# Patient Record
Sex: Female | Born: 1935
Health system: Southern US, Community
[De-identification: ages and names within clinical notes are randomized; demographics above are authoritative.]

## PROBLEM LIST (undated history)

## (undated) DIAGNOSIS — M1990 Unspecified osteoarthritis, unspecified site: Secondary | ICD-10-CM

## (undated) DIAGNOSIS — R590 Localized enlarged lymph nodes: Secondary | ICD-10-CM

## (undated) DIAGNOSIS — E785 Hyperlipidemia, unspecified: Secondary | ICD-10-CM

## (undated) DIAGNOSIS — I10 Essential (primary) hypertension: Secondary | ICD-10-CM

## (undated) DIAGNOSIS — D649 Anemia, unspecified: Secondary | ICD-10-CM

## (undated) DIAGNOSIS — R42 Dizziness and giddiness: Secondary | ICD-10-CM

## (undated) HISTORY — DX: Anemia, unspecified: D64.9

## (undated) HISTORY — DX: Hyperlipidemia, unspecified: E78.5

## (undated) HISTORY — DX: Essential (primary) hypertension: I10

## (undated) HISTORY — DX: Dizziness and giddiness: R42

---

## 2000-02-02 ENCOUNTER — Encounter: Payer: Self-pay | Admitting: Family Medicine

## 2000-02-02 ENCOUNTER — Ambulatory Visit (HOSPITAL_COMMUNITY): Admission: RE | Admit: 2000-02-02 | Discharge: 2000-02-02 | Payer: Self-pay | Admitting: Family Medicine

## 2000-09-15 ENCOUNTER — Emergency Department (HOSPITAL_COMMUNITY): Admission: EM | Admit: 2000-09-15 | Discharge: 2000-09-15 | Payer: Self-pay

## 2000-09-24 ENCOUNTER — Emergency Department (HOSPITAL_COMMUNITY): Admission: EM | Admit: 2000-09-24 | Discharge: 2000-09-24 | Payer: Self-pay

## 2001-11-18 ENCOUNTER — Other Ambulatory Visit: Admission: RE | Admit: 2001-11-18 | Discharge: 2001-11-18 | Payer: Self-pay | Admitting: Family Medicine

## 2001-11-28 ENCOUNTER — Ambulatory Visit (HOSPITAL_COMMUNITY): Admission: RE | Admit: 2001-11-28 | Discharge: 2001-11-28 | Payer: Self-pay | Admitting: Family Medicine

## 2001-11-28 ENCOUNTER — Encounter: Payer: Self-pay | Admitting: Family Medicine

## 2002-01-02 HISTORY — PX: ROTATOR CUFF REPAIR: SHX139

## 2002-10-29 ENCOUNTER — Encounter: Admission: RE | Admit: 2002-10-29 | Discharge: 2003-01-27 | Payer: Self-pay | Admitting: Family Medicine

## 2004-01-31 ENCOUNTER — Emergency Department (HOSPITAL_COMMUNITY): Admission: EM | Admit: 2004-01-31 | Discharge: 2004-01-31 | Payer: Self-pay | Admitting: Family Medicine

## 2004-08-15 ENCOUNTER — Ambulatory Visit: Payer: Self-pay | Admitting: Family Medicine

## 2004-09-04 HISTORY — PX: OTHER SURGICAL HISTORY: SHX169

## 2004-09-15 ENCOUNTER — Ambulatory Visit: Payer: Self-pay | Admitting: Family Medicine

## 2004-09-27 ENCOUNTER — Ambulatory Visit (HOSPITAL_COMMUNITY): Admission: RE | Admit: 2004-09-27 | Discharge: 2004-09-27 | Payer: Self-pay | Admitting: Family Medicine

## 2005-01-24 ENCOUNTER — Ambulatory Visit: Payer: Self-pay | Admitting: Family Medicine

## 2005-04-25 ENCOUNTER — Other Ambulatory Visit: Admission: RE | Admit: 2005-04-25 | Discharge: 2005-04-25 | Payer: Self-pay | Admitting: Obstetrics and Gynecology

## 2005-09-03 ENCOUNTER — Emergency Department (HOSPITAL_COMMUNITY): Admission: EM | Admit: 2005-09-03 | Discharge: 2005-09-03 | Payer: Self-pay | Admitting: Family Medicine

## 2006-02-02 ENCOUNTER — Encounter: Payer: Self-pay | Admitting: Family Medicine

## 2006-02-02 LAB — CONVERTED CEMR LAB

## 2006-08-03 ENCOUNTER — Ambulatory Visit: Payer: Self-pay | Admitting: Family Medicine

## 2006-08-10 ENCOUNTER — Ambulatory Visit (HOSPITAL_COMMUNITY): Admission: RE | Admit: 2006-08-10 | Discharge: 2006-08-10 | Payer: Self-pay | Admitting: Family Medicine

## 2006-10-03 ENCOUNTER — Ambulatory Visit: Payer: Self-pay | Admitting: Family Medicine

## 2006-12-03 ENCOUNTER — Ambulatory Visit: Payer: Self-pay | Admitting: Family Medicine

## 2006-12-03 LAB — CONVERTED CEMR LAB
ALT: 18 units/L (ref 0–40)
AST: 22 units/L (ref 0–37)
Albumin: 3.5 g/dL (ref 3.5–5.2)
Alkaline Phosphatase: 51 units/L (ref 39–117)
Bilirubin, Direct: 0.1 mg/dL (ref 0.0–0.3)
Cholesterol: 134 mg/dL (ref 0–200)
HDL: 55.5 mg/dL (ref >39.0)
Hgb A1c MFr Bld: 6.7 % — ABNORMAL HIGH (ref 4.6–6.0)
LDL Cholesterol: 68 mg/dL (ref 0–99)
Total Bilirubin: 0.8 mg/dL (ref 0.3–1.2)
Total CHOL/HDL Ratio: 2.4
Total Protein: 6.4 g/dL (ref 6.0–8.3)
Triglycerides: 52 mg/dL (ref 0–149)
VLDL: 10 mg/dL (ref 0–40)

## 2007-07-17 ENCOUNTER — Encounter: Payer: Self-pay | Admitting: Family Medicine

## 2007-07-17 DIAGNOSIS — E785 Hyperlipidemia, unspecified: Secondary | ICD-10-CM

## 2007-07-17 DIAGNOSIS — D649 Anemia, unspecified: Secondary | ICD-10-CM

## 2007-07-17 DIAGNOSIS — E119 Type 2 diabetes mellitus without complications: Secondary | ICD-10-CM | POA: Insufficient documentation

## 2007-07-17 DIAGNOSIS — I4949 Other premature depolarization: Secondary | ICD-10-CM

## 2007-07-17 DIAGNOSIS — N390 Urinary tract infection, site not specified: Secondary | ICD-10-CM

## 2007-07-17 DIAGNOSIS — E1169 Type 2 diabetes mellitus with other specified complication: Secondary | ICD-10-CM | POA: Insufficient documentation

## 2007-07-17 DIAGNOSIS — I10 Essential (primary) hypertension: Secondary | ICD-10-CM | POA: Insufficient documentation

## 2007-07-18 ENCOUNTER — Ambulatory Visit: Payer: Self-pay | Admitting: Family Medicine

## 2008-02-18 ENCOUNTER — Encounter: Payer: Self-pay | Admitting: Family Medicine

## 2008-04-22 ENCOUNTER — Ambulatory Visit: Payer: Self-pay | Admitting: Family Medicine

## 2008-05-01 ENCOUNTER — Ambulatory Visit: Payer: Self-pay | Admitting: Family Medicine

## 2008-05-01 DIAGNOSIS — M810 Age-related osteoporosis without current pathological fracture: Secondary | ICD-10-CM

## 2008-05-04 LAB — CONVERTED CEMR LAB
ALT: 17 units/L (ref 0–35)
AST: 20 units/L (ref 0–37)
Alkaline Phosphatase: 52 units/L (ref 39–117)
BUN: 13 mg/dL (ref 6–23)
Basophils Absolute: 0.1 10*3/uL (ref 0.0–0.1)
Bilirubin, Direct: 0.1 mg/dL (ref 0.0–0.3)
CO2: 31 meq/L (ref 19–32)
Calcium: 9.2 mg/dL (ref 8.4–10.5)
Cholesterol: 125 mg/dL (ref 0–200)
Creatinine, Ser: 0.7 mg/dL (ref 0.4–1.2)
Glucose, Bld: 124 mg/dL — ABNORMAL HIGH (ref 70–99)
Hgb A1c MFr Bld: 6.6 % — ABNORMAL HIGH (ref 4.6–6.0)
LDL Cholesterol: 73 mg/dL (ref 0–99)
Lymphocytes Relative: 36.7 % (ref 12.0–46.0)
MCHC: 34 g/dL (ref 30.0–36.0)
Monocytes Relative: 10.6 % (ref 3.0–12.0)
Neutrophils Relative %: 47.3 % (ref 43.0–77.0)
Platelets: 241 10*3/uL (ref 150–400)
RDW: 13.1 % (ref 11.5–14.6)
Sodium: 142 meq/L (ref 135–145)
Total Bilirubin: 0.8 mg/dL (ref 0.3–1.2)
Total CHOL/HDL Ratio: 2.9
Triglycerides: 40 mg/dL (ref 0–149)
VLDL: 8 mg/dL (ref 0–40)

## 2008-05-06 ENCOUNTER — Ambulatory Visit: Payer: Self-pay | Admitting: Family Medicine

## 2008-05-12 LAB — CONVERTED CEMR LAB
Folate: 12.8 ng/mL
Iron: 60 ug/dL (ref 42–145)
Saturation Ratios: 19.4 % — ABNORMAL LOW (ref 20.0–50.0)
Vitamin B-12: 400 pg/mL (ref 211–911)

## 2008-05-29 ENCOUNTER — Ambulatory Visit: Payer: Self-pay | Admitting: Family Medicine

## 2008-05-29 LAB — CONVERTED CEMR LAB: OCCULT 3: NEGATIVE

## 2008-06-10 ENCOUNTER — Ambulatory Visit: Payer: Self-pay | Admitting: Family Medicine

## 2008-06-10 DIAGNOSIS — D568 Other thalassemias: Secondary | ICD-10-CM | POA: Insufficient documentation

## 2008-06-12 LAB — CONVERTED CEMR LAB
Basophils Absolute: 0 10*3/uL (ref 0.0–0.1)
Eosinophils Relative: 4 % (ref 0–5)
HCT: 34.7 % — ABNORMAL LOW (ref 36.0–46.0)
Hemoglobin: 11 g/dL — ABNORMAL LOW (ref 12.0–15.0)
Hgb S Quant: 0 % (ref 0.0–0.0)
Lymphocytes Relative: 43 % (ref 12–46)
Lymphs Abs: 2.4 10*3/uL (ref 0.7–4.0)
MCV: 94.8 fL (ref 78.0–100.0)
Monocytes Absolute: 0.5 10*3/uL (ref 0.1–1.0)
RDW: 13.4 % (ref 11.5–15.5)

## 2008-06-18 ENCOUNTER — Ambulatory Visit: Payer: Self-pay | Admitting: Internal Medicine

## 2008-07-02 ENCOUNTER — Ambulatory Visit: Payer: Self-pay | Admitting: Internal Medicine

## 2008-07-02 LAB — HM COLONOSCOPY

## 2009-08-04 LAB — HM DIABETES EYE EXAM: HM Diabetic Eye Exam: NORMAL

## 2009-08-24 ENCOUNTER — Ambulatory Visit (HOSPITAL_COMMUNITY): Admission: RE | Admit: 2009-08-24 | Discharge: 2009-08-24 | Payer: Self-pay | Admitting: Family Medicine

## 2009-08-26 ENCOUNTER — Encounter (INDEPENDENT_AMBULATORY_CARE_PROVIDER_SITE_OTHER): Payer: Self-pay | Admitting: *Deleted

## 2009-10-04 ENCOUNTER — Ambulatory Visit: Payer: Self-pay | Admitting: Family Medicine

## 2009-10-04 LAB — CONVERTED CEMR LAB
Ketones, urine, test strip: NEGATIVE
Urobilinogen, UA: 0.2

## 2009-10-05 ENCOUNTER — Encounter: Payer: Self-pay | Admitting: Family Medicine

## 2009-10-06 ENCOUNTER — Encounter: Payer: Self-pay | Admitting: Family Medicine

## 2009-10-06 LAB — CONVERTED CEMR LAB
ALT: 19 units/L (ref 0–35)
BUN: 8 mg/dL (ref 6–23)
CO2: 31 meq/L (ref 19–32)
Calcium: 9.3 mg/dL (ref 8.4–10.5)
Cholesterol: 151 mg/dL (ref 0–200)
Creatinine, Ser: 0.8 mg/dL (ref 0.4–1.2)
Eosinophils Relative: 3.5 % (ref 0.0–5.0)
HCT: 34.1 % — ABNORMAL LOW (ref 36.0–46.0)
HDL: 62.5 mg/dL (ref >39.00)
Hgb A1c MFr Bld: 6.9 % — ABNORMAL HIGH (ref 4.6–6.5)
Lymphs Abs: 2 10*3/uL (ref 0.7–4.0)
MCV: 96.2 fL (ref 78.0–100.0)
Microalb, Ur: 4.8 mg/dL — ABNORMAL HIGH (ref 0.0–1.9)
Monocytes Absolute: 0.4 10*3/uL (ref 0.1–1.0)
Platelets: 240 10*3/uL (ref 150.0–400.0)
RDW: 12.6 % (ref 11.5–14.6)
TSH: 0.81 microintl units/mL (ref 0.35–5.50)
Total Bilirubin: 0.5 mg/dL (ref 0.3–1.2)
Triglycerides: 54 mg/dL (ref 0.0–149.0)
WBC: 5 10*3/uL (ref 4.5–10.5)

## 2009-10-11 ENCOUNTER — Encounter: Payer: Self-pay | Admitting: Family Medicine

## 2009-10-11 ENCOUNTER — Ambulatory Visit: Payer: Self-pay | Admitting: Internal Medicine

## 2009-10-22 ENCOUNTER — Ambulatory Visit: Payer: Self-pay | Admitting: Family Medicine

## 2009-10-22 DIAGNOSIS — E876 Hypokalemia: Secondary | ICD-10-CM | POA: Insufficient documentation

## 2009-10-25 LAB — CONVERTED CEMR LAB: Potassium: 3.6 meq/L (ref 3.5–5.1)

## 2009-11-12 LAB — FECAL OCCULT BLOOD, GUAIAC: Fecal Occult Blood: NEGATIVE

## 2010-03-02 ENCOUNTER — Ambulatory Visit: Payer: Self-pay | Admitting: Family Medicine

## 2010-03-03 LAB — CONVERTED CEMR LAB
Albumin: 3.8 g/dL (ref 3.5–5.2)
BUN: 12 mg/dL (ref 6–23)
CO2: 32 meq/L (ref 19–32)
Calcium: 9.2 mg/dL (ref 8.4–10.5)
Creatinine, Ser: 0.7 mg/dL (ref 0.4–1.2)
GFR calc non Af Amer: 107.46 mL/min (ref >60)

## 2010-04-19 ENCOUNTER — Ambulatory Visit: Payer: Self-pay | Admitting: Family Medicine

## 2010-05-24 ENCOUNTER — Ambulatory Visit: Payer: Self-pay | Admitting: Family Medicine

## 2010-10-04 NOTE — Miscellaneous (Signed)
Summary: med list update- potassium  Clinical Lists Changes  Medications: Added new medication of POTASSIUM CHLORIDE CRYS CR 20 MEQ CR-TABS (POTASSIUM CHLORIDE CRYS CR) take one by mouth daily     Prior Medications: ENALAPRIL-HYDROCHLOROTHIAZIDE 10-25 MG TABS (ENALAPRIL-HYDROCHLOROTHIAZIDE) one by mouth daily ACTONEL 35 MG  TABS (RISEDRONATE SODIUM) one by mouth weekly CRESTOR 20 MG  TABS (ROSUVASTATIN CALCIUM) one by mouth once daily ESTRACE 0.1 MG/GM  CREA (ESTRADIOL) use small amount intravaginally twice weekly as directed FLONASE 50 MCG/ACT  SUSP (FLUTICASONE PROPIONATE) 2 sprays in each nostril once daily CORTISPORIN 3.5-10000-1  SOLN (NEOMYCIN-POLYMYXIN-HC) 2 drops in affected ear three times a day MIRALAX   POWD (POLYETHYLENE GLYCOL 3350) per prep instructions DULCOLAX 5 MG  TBEC (BISACODYL) per prep instructions METOCLOPRAMIDE HCL 10 MG  TABS (METOCLOPRAMIDE HCL) per prep instructions LUMIGAN 0.03 % SOLN (BIMATOPROST) use one drop each eye at bedtime ALPHAGAN P 0.1 % SOLN (BRIMONIDINE TARTRATE) use one drop in left eye two times a day Current Allergies: SULFA LIPITOR

## 2010-10-04 NOTE — Assessment & Plan Note (Signed)
Summary: 2 weel follow up   Vital Signs:  Patient profile:   75 year old female Height:      61 inches Weight:      160 pounds BMI:     30.34 Temp:     97.6 degrees F oral Pulse rate:   92 / minute Pulse rhythm:   regular BP sitting:   128 / 70  (left arm) Cuff size:   regular  Vitals Entered By: Lewanda Rife LPN (October 22, 2009 9:11 AM)  History of Present Illness: here to f/u for hypokalemia and also DM and OP   is feeling good now   K was 3.1 - so started on suppl daily- needs that re checked today no problems with potassium  this helped her cramps and mucsle pain   AIC was 6.9 microalb up  is trying to follow diabetes diet the best -- also does a lot of fasting for her church  has done dM classes  is not eating any sweets  perhaps too much bread - does eat whole grain   uses her daughters machine to check sugar 130s-150s    wt stable  OP on dexa -- on actonel   has been exercising a lot   Allergies: 1)  Sulfa 2)  Lipitor  Past History:  Past Medical History: Last updated: 05/06/2008 Diabetes mellitus, type II Dizziness or vertigo Hypertension hyperlipidemia anemia   Past Surgical History: Last updated: 07/05/2008 Hysterectomy- uterine prolapse Rotator cuff repair (01/2002) Dexa- osteopenia (12/2001) Bladder papilloma (2006) colonoscopy diverticulosis (10/09)  Family History: Last updated: 08-07-07 Father: deceased- [place] Mother:deceased- [place]  Siblings: 6 brothers, 1 sister  Social History: Last updated: 05/06/2008 Marital Status: Married Children: retired Occupation: 4 daughters non smoker   Risk Factors: Smoking Status: never (August 07, 2007)  Review of Systems General:  Denies fatigue, fever, loss of appetite, and malaise. Eyes:  Denies blurring and discharge. CV:  Denies chest pain or discomfort, lightheadness, palpitations, and shortness of breath with exertion. Resp:  Denies cough and wheezing. GI:  Denies abdominal pain,  change in bowel habits, indigestion, and nausea. GU:  Denies urinary frequency. MS:  Denies muscle aches and muscle weakness. Derm:  Denies itching, lesion(s), and rash. Neuro:  Denies numbness and tingling. Endo:  Denies excessive thirst and excessive urination. Heme:  Denies abnormal bruising and bleeding.  Physical Exam  General:  Well-developed,well-nourished,in no acute distress; alert,appropriate and cooperative throughout examination Head:  normocephalic, atraumatic, and no abnormalities observed.   Eyes:  vision grossly intact, pupils equal, pupils round, and pupils reactive to light.  no conjunctival pallor, injection or icterus  Neck:  supple with full rom and no masses or thyromegally, no JVD or carotid bruit  Lungs:  Normal respiratory effort, chest expands symmetrically. Lungs are clear to auscultation, no crackles or wheezes. Heart:  Normal rate and regular rhythm. S1 and S2 normal without gallop, murmur, click, rub or other extra sounds. Msk:  No deformity or scoliosis noted of thoracic or lumbar spine.  no acute joint change  no CVA tenderness  Pulses:  R and L carotid,radial,femoral,dorsalis pedis and posterior tibial pulses are full and equal bilaterally Extremities:  No clubbing, cyanosis, edema, or deformity noted with normal full range of motion of all joints.   Neurologic:  sensation intact to light touch, gait normal, and DTRs symmetrical and normal.   Skin:  Intact without suspicious lesions or rashes some lentigos diffusely Cervical Nodes:  No lymphadenopathy noted Psych:  normal affect, talkative  and pleasant   Diabetes Management Exam:    Foot Exam (with socks and/or shoes not present):       Sensory-Pinprick/Light touch:          Left medial foot (L-4): normal          Left dorsal foot (L-5): normal          Left lateral foot (S-1): normal          Right medial foot (L-4): normal          Right dorsal foot (L-5): normal          Right lateral foot (S-1):  normal       Sensory-Monofilament:          Left foot: normal          Right foot: normal       Inspection:          Left foot: normal          Right foot: normal       Nails:          Left foot: normal          Right foot: normal   Impression & Recommendations:  Problem # 1:  HYPOKALEMIA (ICD-276.8) Assessment New pt feels better on supplementation check level today  Orders: Venipuncture (29518) TLB-Potassium (K+) (84132-K)  Problem # 2:  UNSPECIFIED OSTEOPOROSIS (ICD-733.00) Assessment: Unchanged rev bone density test with OP rev ca and vit D continue actonel Her updated medication list for this problem includes:    Actonel 35 Mg Tabs (Risedronate sodium) ..... One by mouth weekly  Problem # 3:  DIABETES MELLITUS, TYPE II (ICD-250.00) Assessment: Deteriorated  not at goal / with elevated microalb - despite opt diet enc to continue exercise  add metformin two times a day 500 and update  lab and f/u 3 mo  disc healthy diet (low simple sugar/ choose complex carbs/ low sat fat) diet and exercise in detail  Her updated medication list for this problem includes:    Enalapril-hydrochlorothiazide 10-25 Mg Tabs (Enalapril-hydrochlorothiazide) ..... One by mouth daily    Glucophage 500 Mg Tabs (Metformin hcl) .Marland Kitchen... 1 by mouth two times a day  Labs Reviewed: Creat: 0.8 (10/04/2009)     Last Eye Exam: normal (08/04/2009) Reviewed HgBA1c results: 6.9 (10/04/2009)  6.6 (05/01/2008)  Problem # 4:  HYPERCHOLESTEROLEMIA (ICD-272.0) Assessment: Unchanged  has been well controlled on crestor and low sat fat diet rev last labs   Her updated medication list for this problem includes:    Crestor 20 Mg Tabs (Rosuvastatin calcium) ..... One by mouth once daily  Labs Reviewed: SGOT: 26 (10/04/2009)   SGPT: 19 (10/04/2009)   HDL:62.50 (10/04/2009), 43.8 (05/01/2008)  LDL:78 (10/04/2009), 73 (05/01/2008)  Chol:151 (10/04/2009), 125 (05/01/2008)  Trig:54.0 (10/04/2009), 40  (05/01/2008)  Complete Medication List: 1)  Enalapril-hydrochlorothiazide 10-25 Mg Tabs (Enalapril-hydrochlorothiazide) .... One by mouth daily 2)  Actonel 35 Mg Tabs (Risedronate sodium) .... One by mouth weekly 3)  Crestor 20 Mg Tabs (Rosuvastatin calcium) .... One by mouth once daily 4)  Estrace 0.1 Mg/gm Crea (Estradiol) .... Use small amount intravaginally twice weekly as directed 5)  Flonase 50 Mcg/act Susp (Fluticasone propionate) .... 2 sprays in each nostril once daily as needed 6)  Lumigan 0.03 % Soln (Bimatoprost) .... Use one drop each eye at bedtime 7)  Alphagan P 0.1 % Soln (Brimonidine tartrate) .... Use one drop in left eye two times a day 8)  Potassium Chloride  Crys Cr 20 Meq Cr-tabs (Potassium chloride crys cr) .... Take one by mouth daily 9)  Glucophage 500 Mg Tabs (Metformin hcl) .Marland Kitchen.. 1 by mouth two times a day  Patient Instructions: 1)  goal for sugar is 120 or below in am and 140 or below 2 hours after evening meal  2)  keep drinking lots of water  3)  keep working on healthy diet and exercise 4)  start metformin 500 mg two times a day  5)  if any side effects- update me  6)  checking potassium today  7)  schedule lab in 3 months AIC / renal 250.0 and then follow up  Prescriptions: GLUCOPHAGE 500 MG TABS (METFORMIN HCL) 1 by mouth two times a day  #60 x 11   Entered and Authorized by:   Judith Part MD   Signed by:   Judith Part MD on 10/22/2009   Method used:   Electronically to        [facility]  [address] [id number] (retail)       [address]       [place], [place]  [address]       Ph: [phone]       Fax: [phone]   RxID:   [id number]   Current Allergies (reviewed today): SULFA LIPITOR

## 2010-10-04 NOTE — Assessment & Plan Note (Signed)
Summary: FOLLOW UP PER DR TOWER/RI   Vital Signs:  Patient profile:   75 year old female Height:      61 inches Weight:      158 pounds BMI:     29.96 Temp:     97.9 degrees F oral Pulse rate:   96 / minute Pulse rhythm:   regular BP sitting:   162 / 84  (left arm) Cuff size:   regular  Vitals Entered By: Lewanda Rife LPN (2010-05-10 8:40 AM) CC: follow-up visit   History of Present Illness: here for f/u of DM and hypokalemia   is doing good   started metformin last time  renal prof nl  has not had K in over a month -- will not re start it    AIC is down to 6.6 is checking sugars occas -- fluctuates -- range [phone] no side eff with metformin  is drinking lots of water   bp up on first check today  not anxious or keyed up  is stressed - caring for her daughter with surgery  Allergies: 1)  Sulfa 2)  Lipitor  Past History:  Past Medical History: Last updated: 05/06/2008 Diabetes mellitus, type II Dizziness or vertigo Hypertension hyperlipidemia anemia   Past Surgical History: Last updated: 07/05/2008 Hysterectomy- uterine prolapse Rotator cuff repair (01/2002) Dexa- osteopenia (12/2001) Bladder papilloma (2006) colonoscopy diverticulosis (10/09)  Family History: Last updated: 05-10-2010 Father: deceased- [place] Mother:deceased- [place]  Siblings: 6 brothers, 1 sister daughter -- back surgeries   Social History: Last updated: 05/06/2008 Marital Status: Married Children: retired Occupation: 4 daughters non smoker   Risk Factors: Smoking Status: never (07/17/2007)  Family History: Father: deceased- [place] Mother:deceased- [place]  Siblings: 6 brothers, 1 sister daughter -- back surgeries   Review of Systems General:  Denies fatigue, loss of appetite, and malaise. Eyes:  Denies blurring and eye irritation. CV:  Denies chest pain or discomfort, lightheadness, palpitations, and shortness of breath with exertion. Resp:  Denies cough, shortness of  breath, and wheezing. GI:  Denies abdominal pain, change in bowel habits, indigestion, and nausea. GU:  Denies dysuria and urinary frequency. MS:  Denies muscle aches and cramps. Derm:  Denies itching, lesion(s), poor wound healing, and rash. Neuro:  Denies numbness and tingling. Psych:  Denies anxiety and depression. Endo:  Denies excessive thirst and excessive urination. Heme:  Denies abnormal bruising and bleeding.  Physical Exam  General:  Well-developed,well-nourished,in no acute distress; alert,appropriate and cooperative throughout examination Head:  normocephalic, atraumatic, and no abnormalities observed.   Eyes:  vision grossly intact, pupils equal, pupils round, and pupils reactive to light.  no conjunctival pallor, injection or icterus  Mouth:  pharynx pink and moist.   Neck:  supple with full rom and no masses or thyromegally, no JVD or carotid bruit  Lungs:  Normal respiratory effort, chest expands symmetrically. Lungs are clear to auscultation, no crackles or wheezes. Heart:  Normal rate and regular rhythm. S1 and S2 normal without gallop, murmur, click, rub or other extra sounds. Abdomen:  Bowel sounds positive,abdomen soft and non-tender without masses, organomegaly or hernias noted. no renal bruits   Msk:  No deformity or scoliosis noted of thoracic or lumbar spine.   Pulses:  R and L carotid,radial,femoral,dorsalis pedis and posterior tibial pulses are full and equal bilaterally Extremities:  No clubbing, cyanosis, edema, or deformity noted with normal full range of motion of all joints.   Neurologic:  sensation intact to light touch, gait normal, and  DTRs symmetrical and normal.   Skin:  Intact without suspicious lesions or rashes Cervical Nodes:  No lymphadenopathy noted Inguinal Nodes:  No significant adenopathy Psych:  normal affect, talkative and pleasant   Diabetes Management Exam:    Foot Exam (with socks and/or shoes not present):       Sensory-Pinprick/Light  touch:          Left medial foot (L-4): normal          Left dorsal foot (L-5): normal          Left lateral foot (S-1): normal          Right medial foot (L-4): normal          Right dorsal foot (L-5): normal          Right lateral foot (S-1): normal       Sensory-Monofilament:          Left foot: normal          Right foot: normal       Inspection:          Left foot: normal          Right foot: normal       Nails:          Left foot: normal          Right foot: normal   Impression & Recommendations:  Problem # 1:  HYPOKALEMIA (ICD-276.8) Assessment Improved this is resolved - ? more K in diet  off supplement - will continue to monitor   Problem # 2:  HYPERCHOLESTEROLEMIA (ICD-272.0) Assessment: Unchanged  will checkin 6 mo - has been well controlled with statin and diet  Her updated medication list for this problem includes:    Crestor 20 Mg Tabs (Rosuvastatin calcium) ..... One by mouth once daily  Labs Reviewed: SGOT: 26 (10/04/2009)   SGPT: 19 (10/04/2009)   HDL:62.50 (10/04/2009), 43.8 (05/01/2008)  LDL:78 (10/04/2009), 73 (05/01/2008)  Chol:151 (10/04/2009), 125 (05/01/2008)  Trig:54.0 (10/04/2009), 40 (05/01/2008)  Problem # 3:  HYPERTENSION (ICD-401.9) Assessment: Deteriorated  bp up today-- ? why -- pt was rushing  will re check at nurse visit in 2 wk-- disc med adj if still high  this is unusual for her Her updated medication list for this problem includes:    Enalapril-hydrochlorothiazide 10-25 Mg Tabs (Enalapril-hydrochlorothiazide) .Marland Kitchen..Marland Kitchen Two  by mouth daily  BP today: 162/84 Prior BP: 128/70 (10/22/2009)  Labs Reviewed: K+: 3.9 (03/02/2010) Creat: : 0.7 (03/02/2010)   Chol: 151 (10/04/2009)   HDL: 62.50 (10/04/2009)   LDL: 78 (10/04/2009)   TG: 54.0 (10/04/2009)  Problem # 4:  DIABETES MELLITUS, TYPE II (ICD-250.00) Assessment: Improved  this is imp with addn of metformin- doing well  rev labs in detail disc healthy diet (low simple sugar/  choose complex carbs/ low sat fat) diet and exercise in detail  lab and f/u in 6 mo  good foot and eye care  Her updated medication list for this problem includes:    Enalapril-hydrochlorothiazide 10-25 Mg Tabs (Enalapril-hydrochlorothiazide) .Marland Kitchen..Marland Kitchen Two  by mouth daily    Glucophage 500 Mg Tabs (Metformin hcl) .Marland Kitchen... 1 by mouth two times a day  Labs Reviewed: Creat: 0.7 (03/02/2010)     Last Eye Exam: normal (08/04/2009) Reviewed HgBA1c results: 6.6 (03/02/2010)  6.9 (10/04/2009)  Complete Medication List: 1)  Enalapril-hydrochlorothiazide 10-25 Mg Tabs (Enalapril-hydrochlorothiazide) .... Two  by mouth daily 2)  Actonel 35 Mg Tabs (Risedronate sodium) .... One by mouth weekly 3)  Crestor 20 Mg Tabs (Rosuvastatin calcium) .... One by mouth once daily 4)  Estrace 0.1 Mg/gm Crea (Estradiol) .... Use small amount intravaginally twice weekly as directed 5)  Flonase 50 Mcg/act Susp (Fluticasone propionate) .... 2 sprays in each nostril once daily as needed 6)  Lumigan 0.03 % Soln (Bimatoprost) .... Use one drop each eye at bedtime 7)  Alphagan P 0.1 % Soln (Brimonidine tartrate) .... Use one drop in left eye two times a day 8)  Potassium Chloride Crys Cr 20 Meq Cr-tabs (Potassium chloride crys cr) .... Take one by mouth daily 9)  Glucophage 500 Mg Tabs (Metformin hcl) .Marland Kitchen.. 1 by mouth two times a day  Patient Instructions: 1)  no change in medicine  2)  diabetes is improved 3)  keep up the good work with diet and exercise  4)  schedule fasting labs and then f/u for 30 min check up in 6 months  5)  follow up for nurse visit for blood pressure check in 2 weeks   Current Allergies (reviewed today): SULFA LIPITOR

## 2010-10-04 NOTE — Assessment & Plan Note (Signed)
Summary: CPX   Vital Signs:  Patient profile:   75 year old female Height:      61 inches Weight:      161 pounds BMI:     30.53 Temp:     97.5 degrees F oral Pulse rate:   84 / minute Pulse rhythm:   regular BP sitting:   130 / 74  (left arm) Cuff size:   regular  Vitals Entered By: Lowella Petties CMA (October 04, 2009 8:47 AM) CC: 30 minute check up, burning with urination   History of Present Illness: here for check up of chronic med problems  hasn't needed to come in for a while   wt is up 3 lb today-- different on her scale - no gain   very busy husb in nursing home and daughter had surg   bp 140/74  DM  -- no changes , is doing very well with her diet not enough exercise recently  opthy  eye last mo  lipids- due for check   Last Lipid ProfileCholesterol: 125 (05/01/2008 10:22:00 AM)HDL:  43.8 (05/01/2008 10:22:00 AM)LDL:  73 (05/01/2008 10:22:00 AM)Triglycerides:  Last Liver profileSGOT:  20 (05/01/2008 10:22:00 AM)SPGT:  17 (05/01/2008 10:22:00 AM)T. Bili:  0.8 (05/01/2008 10:22:00 AM)Alk Phos:  52 (05/01/2008 10:22:00 AM)   PAP was 07-- had hyst --  has some vaginal dryness    dexa 03 with osteopenia   mam 12/10- negative  self exam - no lumps or problems   Td -- is due  no flu shot - egg allergies     Allergies: 1)  Sulfa 2)  Lipitor  Past History:  Past Medical History: Last updated: 05/06/2008 Diabetes mellitus, type II Dizziness or vertigo Hypertension hyperlipidemia anemia   Past Surgical History: Last updated: 07/05/2008 Hysterectomy- uterine prolapse Rotator cuff repair (01/2002) Dexa- osteopenia (12/2001) Bladder papilloma (2006) colonoscopy diverticulosis (10/09)  Family History: Last updated: 2007-08-14 Father: deceased- [place] Mother:deceased- [place]  Siblings: 6 brothers, 1 sister  Social History: Last updated: 05/06/2008 Marital Status: Married Children: retired Occupation: 4 daughters non smoker   Risk  Factors: Smoking Status: never (Aug 14, 2007)  Review of Systems General:  Complains of fatigue; denies loss of appetite and malaise. Eyes:  Denies blurring and eye pain. CV:  Denies chest pain or discomfort, lightheadness, and palpitations. Resp:  Denies cough and wheezing. GI:  Denies abdominal pain, bloody stools, change in bowel habits, and indigestion. GU:  Denies abnormal vaginal bleeding, discharge, dysuria, and urinary frequency. MS:  Denies joint pain and stiffness. Derm:  Denies itching, lesion(s), poor wound healing, and rash. Neuro:  Denies numbness and tingling. Psych:  mood is ok . Endo:  Denies cold intolerance and heat intolerance. Heme:  Denies abnormal bruising and bleeding.  Physical Exam  General:  Well-developed,well-nourished,in no acute distress; alert,appropriate and cooperative throughout examination Head:  normocephalic, atraumatic, and no abnormalities observed.   Eyes:  vision grossly intact, pupils equal, pupils round, and pupils reactive to light.  no conjunctival pallor, injection or icterus  Ears:  R ear normal and L ear normal.   Nose:  no nasal discharge.   Mouth:  pharynx pink and moist.   Neck:  supple with full rom and no masses or thyromegally, no JVD or carotid bruit  Chest Wall:  No deformities, masses, or tenderness noted. Breasts:  No mass, nodules, thickening, tenderness, bulging, retraction, inflamation, nipple discharge or skin changes noted.   Lungs:  Normal respiratory effort, chest expands symmetrically. Lungs are clear to auscultation,  no crackles or wheezes. Heart:  Normal rate and regular rhythm. S1 and S2 normal without gallop, murmur, click, rub or other extra sounds. Abdomen:  Bowel sounds positive,abdomen soft and non-tender without masses, organomegaly or hernias noted. no renal bruits  Msk:  No deformity or scoliosis noted of thoracic or lumbar spine.  no acute joint change  no CVA tenderness  Pulses:  R and L  carotid,radial,femoral,dorsalis pedis and posterior tibial pulses are full and equal bilaterally Extremities:  No clubbing, cyanosis, edema, or deformity noted with normal full range of motion of all joints.   Neurologic:  sensation intact to light touch, gait normal, and DTRs symmetrical and normal.   Skin:  Intact without suspicious lesions or rashes some lentigos diffusely Cervical Nodes:  No lymphadenopathy noted Axillary Nodes:  No palpable lymphadenopathy Inguinal Nodes:  No significant adenopathy Psych:  normal affect, talkative and pleasant   Diabetes Management Exam:    Foot Exam (with socks and/or shoes not present):       Sensory-Pinprick/Light touch:          Left medial foot (L-4): normal          Left dorsal foot (L-5): normal          Left lateral foot (S-1): normal          Right medial foot (L-4): normal          Right dorsal foot (L-5): normal          Right lateral foot (S-1): normal       Sensory-Monofilament:          Left foot: normal          Right foot: normal       Inspection:          Left foot: normal          Right foot: normal       Nails:          Left foot: normal          Right foot: normal    Eye Exam:       Eye Exam done elsewhere          Date: 08/04/2009          Results: normal          Done by: [facility]   Impression & Recommendations:  Problem # 1:  UNSPECIFIED OSTEOPOROSIS (ICD-733.00) Assessment Unchanged  check dexa  refil actonel rev intake ca and D Her updated medication list for this problem includes:    Actonel 35 Mg Tabs (Risedronate sodium) ..... One by mouth weekly  Orders: Radiology Referral (Radiology)  Vit D:27 (05/01/2008)  Problem # 2:  ANEMIA, CHRONIC (ICD-281.9) Assessment: Unchanged  with thalassemia  no symptoms  check cbc  Orders: TLB-Lipid Panel (80061-LIPID) TLB-Renal Function Panel (80069-RENAL) TLB-CBC Platelet - w/Differential (85025-CBCD) TLB-Hepatic/Liver Function Pnl  (80076-HEPATIC) TLB-TSH (Thyroid Stimulating Hormone) (84443-TSH) TLB-A1C / Hgb A1C (Glycohemoglobin) (83036-A1C) TLB-Microalbumin/Creat Ratio, Urine (82043-MALB)  Hgb: 11.0 (06/10/2008)   Hct: 34.7 (06/10/2008)   Platelets: 305 (06/10/2008) RBC: 3.66 (06/10/2008)   RDW: 13.4 (06/10/2008)   WBC: 5.4 (06/10/2008) MCV: 94.8 (06/10/2008)   MCHC: 31.7 (06/10/2008) Ferritin: 71.2 (05/06/2008) Iron: 60 (05/06/2008)   % Sat: 19.4 (05/06/2008) B12: 400 (05/06/2008)   Folate: 12.8 (05/06/2008)   TSH: 0.43 (05/01/2008)  Problem # 3:  HYPERCHOLESTEROLEMIA (ICD-272.0) Assessment: Unchanged  lab today has been well controlled on crestor  rev low sat fat diet  Her  updated medication list for this problem includes:    Crestor 20 Mg Tabs (Rosuvastatin calcium) ..... One by mouth once daily  Orders: TLB-Lipid Panel (80061-LIPID) TLB-Renal Function Panel (80069-RENAL) TLB-CBC Platelet - w/Differential (85025-CBCD) TLB-Hepatic/Liver Function Pnl (80076-HEPATIC) TLB-TSH (Thyroid Stimulating Hormone) (84443-TSH) TLB-A1C / Hgb A1C (Glycohemoglobin) (83036-A1C) TLB-Microalbumin/Creat Ratio, Urine (82043-MALB)  Labs Reviewed: SGOT: 20 (05/01/2008)   SGPT: 17 (05/01/2008)   HDL:43.8 (05/01/2008), 55.5 (12/03/2006)  LDL:73 (05/01/2008), 68 (12/03/2006)  Chol:125 (05/01/2008), 134 (12/03/2006)  Trig:40 (05/01/2008), 52 (12/03/2006)  Problem # 4:  UTI'S, RECURRENT (ICD-599.0) Assessment: Deteriorated  with abn ua today-- pend cx to tx disc imp of water intake Orders: T-Culture, Urine (36644-03474) UA Dipstick W/ Micro (manual) (25956)  Encouraged to push clear liquids, get enough rest, and take acetaminophen as needed. To be seen in 10 days if no improvement, sooner if worse.  Problem # 5:  DIABETES MELLITUS, TYPE II (ICD-250.00) Assessment: Unchanged  lab today  is diet controlled utd opthy  f/u 6 mo  Her updated medication list for this problem includes:    Enalapril-hydrochlorothiazide  10-25 Mg Tabs (Enalapril-hydrochlorothiazide) ..... One by mouth daily  Orders: TLB-Lipid Panel (80061-LIPID) TLB-Renal Function Panel (80069-RENAL) TLB-CBC Platelet - w/Differential (85025-CBCD) TLB-Hepatic/Liver Function Pnl (80076-HEPATIC) TLB-TSH (Thyroid Stimulating Hormone) (84443-TSH) TLB-A1C / Hgb A1C (Glycohemoglobin) (83036-A1C) TLB-Microalbumin/Creat Ratio, Urine (82043-MALB)  Labs Reviewed: Creat: 0.7 (05/01/2008)    Reviewed HgBA1c results: 6.6 (05/01/2008)  6.7 (12/03/2006)  Complete Medication List: 1)  Enalapril-hydrochlorothiazide 10-25 Mg Tabs (Enalapril-hydrochlorothiazide) .... One by mouth daily 2)  Actonel 35 Mg Tabs (Risedronate sodium) .... One by mouth weekly 3)  Crestor 20 Mg Tabs (Rosuvastatin calcium) .... One by mouth once daily 4)  Estrace 0.1 Mg/gm Crea (Estradiol) .... Use small amount intravaginally twice weekly as directed 5)  Flonase 50 Mcg/act Susp (Fluticasone propionate) .... 2 sprays in each nostril once daily 6)  Cortisporin 3.5-10000-1 Soln (Neomycin-polymyxin-hc) .... 2 drops in affected ear three times a day 7)  Miralax Powd (Polyethylene glycol 3350) .... Per prep instructions 8)  Dulcolax 5 Mg Tbec (Bisacodyl) .... Per prep instructions 9)  Metoclopramide Hcl 10 Mg Tabs (Metoclopramide hcl) .... Per prep instructions 10)  Lumigan 0.03 % Soln (Bimatoprost) .... Use one drop each eye at bedtime 11)  Alphagan P 0.1 % Soln (Brimonidine tartrate) .... Use one drop in left eye two times a day  Other Orders: Venipuncture (38756) Prescription Created Electronically [phone]) TD Toxoids IM 7 YR + (51884) Admin 1st Vaccine (16606) Admin 1st Vaccine [facility]) [phone])  Patient Instructions: 1)  keep working on healthy diet and exercise  2)  tetnus shot today 3)  labs today  4)  we will schedule bone density test at check out  Prescriptions: FLONASE 50 MCG/ACT  SUSP (FLUTICASONE PROPIONATE) 2 sprays in each nostril once daily  #3 mdi x 3    Entered and Authorized by:   Judith Part MD   Signed by:   Judith Part MD on 10/04/2009   Method used:   Print then Give to Patient   RxID:   [id number] ESTRACE 0.1 MG/GM  CREA (ESTRADIOL) use small amount intravaginally twice weekly as directed  #3 months x 3   Entered and Authorized by:   Judith Part MD   Signed by:   Judith Part MD on 10/04/2009   Method used:   Print then Give to Patient   RxID:   [id number] CRESTOR 20 MG  TABS (ROSUVASTATIN CALCIUM) one by mouth once daily  #  90 x 3   Entered and Authorized by:   Judith Part MD   Signed by:   Judith Part MD on 10/04/2009   Method used:   Print then Give to Patient   RxID:   [id number] ACTONEL 35 MG  TABS (RISEDRONATE SODIUM) one by mouth weekly  #12 x 3   Entered and Authorized by:   Judith Part MD   Signed by:   Judith Part MD on 10/04/2009   Method used:   Print then Give to Patient   RxID:   [id number] ENALAPRIL-HYDROCHLOROTHIAZIDE 10-25 MG TABS (ENALAPRIL-HYDROCHLOROTHIAZIDE) one by mouth daily  #90 x 3   Entered and Authorized by:   Judith Part MD   Signed by:   Judith Part MD on 10/04/2009   Method used:   Print then Give to Patient   RxID:   [id number]   Prior Medications (reviewed today): ESTRACE 0.1 MG/GM  CREA (ESTRADIOL) use small amount intravaginally twice weekly as directed FLONASE 50 MCG/ACT  SUSP (FLUTICASONE PROPIONATE) 2 sprays in each nostril once daily CORTISPORIN 3.5-10000-1  SOLN (NEOMYCIN-POLYMYXIN-HC) 2 drops in affected ear three times a day MIRALAX   POWD (POLYETHYLENE GLYCOL 3350) per prep instructions DULCOLAX 5 MG  TBEC (BISACODYL) per prep instructions METOCLOPRAMIDE HCL 10 MG  TABS (METOCLOPRAMIDE HCL) per prep instructions LUMIGAN 0.03 % SOLN (BIMATOPROST) use one drop each eye at bedtime ALPHAGAN P 0.1 % SOLN (BRIMONIDINE TARTRATE) use one drop in left eye two times a day Current Allergies: SULFA LIPITOR     Tetanus/Td  Vaccine    Vaccine Type: Td    Site: left deltoid    Mfr: Sanofi Pasteur    Dose: 0.5 ml    Route: IM    Given by: Lowella Petties CMA    Exp. Date: 09/17/2003    Lot #: [id number]    VIS given: 07/23/07 version given October 04, 2009.   Laboratory Results   Urine Tests  Date/Time Received: October 04, 2009 8:54 AM  Date/Time Reported: October 04, 2009 8:54 AM   Routine Urinalysis   Color: yellow Appearance: Clear Glucose: negative   (Normal Range: Negative) Bilirubin: negative   (Normal Range: Negative) Ketone: negative   (Normal Range: Negative) Spec. Gravity: <1.005   (Normal Range: 1.003-1.035) Blood: moderate   (Normal Range: Negative) pH: 6.5   (Normal Range: 5.0-8.0) Protein: trace   (Normal Range: Negative) Urobilinogen: 0.2   (Normal Range: 0-1) Nitrite: negative   (Normal Range: Negative) Leukocyte Esterace: trace   (Normal Range: Negative)

## 2010-10-04 NOTE — Miscellaneous (Signed)
Summary: BONE DENSITY  Clinical Lists Changes  Orders: Added new Test order of T-Bone Densitometry (77080) - Signed Added new Test order of T-Lumbar Vertebral Assessment (77082) - Signed 

## 2010-10-04 NOTE — Assessment & Plan Note (Signed)
Summary: BP CHECK/ TOWER  Nurse Visit   Vital Signs:  Patient profile:   75 year old female Temp:     97.6 degrees F oral Pulse rate:   80 / minute Pulse rhythm:   regular BP sitting:   130 / 70  (left arm) Cuff size:   large  Vitals Entered By: Sydell Axon LPN (May 24, 2010 9:37 AM) CC: Patient is here for BP check. Patient states that she is taking her BP medication as prescribed and does not have any complaints today. Patient request a refill on her Estrace cream.   Allergies: 1)  Sulfa 2)  Lipitor  Orders Added: 1)  Est. Patient Level I [29562] Prescriptions: ESTRACE 0.1 MG/GM  CREA (ESTRADIOL) use small amount intravaginally twice weekly as directed  #3 months x 3   Entered and Authorized by:   Judith Part MD   Signed by:   Lewanda Rife LPN on 13/04/6577   Method used:   Telephoned to ...       [id number]       [address]       [place], [place]  [address]       Ph: [phone]       Fax: [phone]   RxID:   [id number]   Current Allergies (reviewed today): SULFA LIPITOR  blood pressure is better - re- assuring px written on EMR for call in for estrace cream  Patient's daughter Annelle notified as instructed by telephone. Medication [facility]oned [facility] as instructed. Lewanda Rife LPN  May 24, 2010 5:08 PM

## 2010-10-11 ENCOUNTER — Telehealth (INDEPENDENT_AMBULATORY_CARE_PROVIDER_SITE_OTHER): Payer: Self-pay | Admitting: *Deleted

## 2010-10-13 ENCOUNTER — Encounter (INDEPENDENT_AMBULATORY_CARE_PROVIDER_SITE_OTHER): Payer: Self-pay | Admitting: *Deleted

## 2010-10-13 ENCOUNTER — Other Ambulatory Visit (INDEPENDENT_AMBULATORY_CARE_PROVIDER_SITE_OTHER): Payer: Medicare Other

## 2010-10-13 ENCOUNTER — Other Ambulatory Visit: Payer: Self-pay | Admitting: Family Medicine

## 2010-10-13 DIAGNOSIS — E7800 Pure hypercholesterolemia, unspecified: Secondary | ICD-10-CM

## 2010-10-13 DIAGNOSIS — I10 Essential (primary) hypertension: Secondary | ICD-10-CM

## 2010-10-13 DIAGNOSIS — E119 Type 2 diabetes mellitus without complications: Secondary | ICD-10-CM

## 2010-10-13 LAB — RENAL FUNCTION PANEL
Albumin: 4.1 g/dL (ref 3.5–5.2)
BUN: 16 mg/dL (ref 6–23)
Calcium: 9.4 mg/dL (ref 8.4–10.5)
Creatinine, Ser: 0.8 mg/dL (ref 0.4–1.2)
Glucose, Bld: 120 mg/dL — ABNORMAL HIGH (ref 70–99)
Potassium: 3.3 mEq/L — ABNORMAL LOW (ref 3.5–5.1)

## 2010-10-13 LAB — LIPID PANEL
LDL Cholesterol: 100 mg/dL — ABNORMAL HIGH (ref 0–99)
Total CHOL/HDL Ratio: 3
Triglycerides: 80 mg/dL (ref 0.0–149.0)

## 2010-10-13 LAB — ALT: ALT: 14 U/L (ref 0–35)

## 2010-10-13 LAB — HEMOGLOBIN A1C: Hgb A1c MFr Bld: 6.6 % — ABNORMAL HIGH (ref 4.6–6.5)

## 2010-10-17 ENCOUNTER — Telehealth (INDEPENDENT_AMBULATORY_CARE_PROVIDER_SITE_OTHER): Payer: Self-pay | Admitting: *Deleted

## 2010-10-20 ENCOUNTER — Ambulatory Visit (INDEPENDENT_AMBULATORY_CARE_PROVIDER_SITE_OTHER): Payer: MEDICARE | Admitting: Family Medicine

## 2010-10-20 ENCOUNTER — Encounter: Payer: Self-pay | Admitting: Family Medicine

## 2010-10-20 DIAGNOSIS — F419 Anxiety disorder, unspecified: Secondary | ICD-10-CM | POA: Insufficient documentation

## 2010-10-20 DIAGNOSIS — I10 Essential (primary) hypertension: Secondary | ICD-10-CM

## 2010-10-20 DIAGNOSIS — E119 Type 2 diabetes mellitus without complications: Secondary | ICD-10-CM

## 2010-10-20 DIAGNOSIS — F411 Generalized anxiety disorder: Secondary | ICD-10-CM | POA: Insufficient documentation

## 2010-10-20 DIAGNOSIS — E7800 Pure hypercholesterolemia, unspecified: Secondary | ICD-10-CM

## 2010-10-20 DIAGNOSIS — E876 Hypokalemia: Secondary | ICD-10-CM

## 2010-10-20 NOTE — Progress Notes (Signed)
----   Converted from flag ---- ---- 10/10/2010 3:49 PM, Colon Flattery Tower MD wrote: please check renal and lipid/ast/alt / lipid and AIc for 272 and 250.0 and 401.1 thanks   ---- 10/10/2010 3:14 PM, Mills Koller wrote: Fasting Labs for a 6 month f/u appt with you a week later, Thanks, T ------------------------------

## 2010-10-26 NOTE — Progress Notes (Signed)
----   Converted from flag ---- ---- 10/15/2010 6:25 PM, Colon Flattery Tower MD wrote: please check lipid/renal/ hepatic/ cbc with diff/ tsh and vit D for 401.1 and 272 and 250.0 and 733.0 thanks  ---- 10/11/2010 7:48 AM, Liane Comber CMA (AAMA) wrote: Lab orders please! Good Morning! This pt is scheduled for cpx labs [place], which labs to draw and dx codes to use? Thanks Tasha ------------------------------

## 2010-11-01 NOTE — Assessment & Plan Note (Signed)
Summary: FOLLOW-UP PER MD   Vital Signs:  Patient profile:   75 year old female Height:      61 inches Weight:      153.50 pounds BMI:     29.11 Temp:     97.8 degrees F oral Pulse rate:   96 / minute Pulse rhythm:   regular BP sitting:   154 / 76  (left arm) Cuff size:   large  Vitals Entered By: Lewanda Rife LPN (October 20, 2010 9:17 AM) CC: six month f/u   History of Present Illness: here for f/u of HTN and DM and lipids  wt is down 5 lb   HTN -- bp is 154/76 first check she knew it would be high- a friend just died and she is arranging the funeral  was up last time too-- then was ok at the drug store -- usually 138/ ?  feeling pretty anxious - needs anx med to get through the funeral     DM stable with AIC 6.6- no change opthy-- ? last exam - thinks it is up to date- will get back to [place]   K is low again 3.3 off supplement  is drinking more fluids   lipids are up very slt Last Lipid ProfileCholesterol: 178 (10/13/2010 9:07:22 AM)HDL:  62.30 (10/13/2010 9:07:22 AM)LDL:  100 (10/13/2010 9:07:22 AM)Triglycerides:  Last Liver profileSGOT:  19 (10/13/2010 9:07:22 AM)SPGT:  14 (10/13/2010 9:07:22 AM)T. Bili:  0.5 (10/04/2009 9:15:29 AM)Alk Phos:  57 (10/04/2009 9:15:29 AM)  not as good with her diet      Allergies: 1)  Sulfa 2)  Lipitor  Past History:  Past Medical History: Last updated: 05/06/2008 Diabetes mellitus, type II Dizziness or vertigo Hypertension hyperlipidemia anemia   Past Surgical History: Last updated: 07/05/2008 Hysterectomy- uterine prolapse Rotator cuff repair (01/2002) Dexa- osteopenia (12/2001) Bladder papilloma (2006) colonoscopy diverticulosis (10/09)  Family History: Last updated: 05-19-2010 Father: deceased- [place] Mother:deceased- [place]  Siblings: 6 brothers, 1 sister daughter -- back surgeries   Social History: Last updated: 05/06/2008 Marital Status: Married Children: retired Occupation: 4 daughters non smoker     Risk Factors: Smoking Status: never (07/17/2007)  Review of Systems General:  Complains of fatigue; denies fever and loss of appetite. Eyes:  Denies blurring and eye irritation. CV:  Denies chest pain or discomfort, palpitations, shortness of breath with exertion, and swelling of feet. Resp:  Denies cough, shortness of breath, and wheezing. GI:  Denies abdominal pain, change in bowel habits, nausea, and vomiting. MS:  Denies muscle aches and cramps. Derm:  Denies itching, lesion(s), poor wound healing, and rash. Neuro:  Denies headaches, numbness, and tingling. Psych:  is very stressed . Endo:  Denies cold intolerance, excessive thirst, excessive urination, and heat intolerance. Heme:  Denies abnormal bruising and bleeding.  Physical Exam  General:  overweight but generally well appearing  is fatigued and stressed  Head:  normocephalic, atraumatic, and no abnormalities observed.   Eyes:  vision grossly intact, pupils equal, pupils round, and pupils reactive to light.  no conjunctival pallor, injection or icterus  Mouth:  pharynx pink and moist.   Neck:  supple with full rom and no masses or thyromegally, no JVD or carotid bruit  Chest Wall:  No deformities, masses, or tenderness noted. Lungs:  Normal respiratory effort, chest expands symmetrically. Lungs are clear to auscultation, no crackles or wheezes. Heart:  Normal rate and regular rhythm. S1 and S2 normal without gallop, murmur, click, rub or other extra sounds.  Abdomen:  Bowel sounds positive,abdomen soft and non-tender without masses, organomegaly or hernias noted. no renal bruits   Msk:  No deformity or scoliosis noted of thoracic or lumbar spine.   Pulses:  R and L carotid,radial,femoral,dorsalis pedis and posterior tibial pulses are full and equal bilaterally Extremities:  No clubbing, cyanosis, edema, or deformity noted with normal full range of motion of all joints.   Neurologic:  sensation intact to light touch, gait  normal, and DTRs symmetrical and normal.   Skin:  Intact without suspicious lesions or rashes Cervical Nodes:  No lymphadenopathy noted Psych:  pt is very stressed and fatigued still has a pleasant affect however and good eye contact   Diabetes Management Exam:    Foot Exam (with socks and/or shoes not present):       Sensory-Pinprick/Light touch:          Left medial foot (L-4): normal          Left dorsal foot (L-5): normal          Left lateral foot (S-1): normal          Right medial foot (L-4): normal          Right dorsal foot (L-5): normal          Right lateral foot (S-1): normal       Sensory-Monofilament:          Left foot: normal          Right foot: normal       Inspection:          Left foot: normal          Right foot: normal       Nails:          Left foot: normal          Right foot: normal   Impression & Recommendations:  Problem # 1:  HYPOKALEMIA (ICD-276.8) Assessment Deteriorated  will start back on her K  no cramps or other symptoms   Orders: Prescription Created Electronically [phone])  Problem # 2:  HYPERCHOLESTEROLEMIA (ICD-272.0) Assessment: Deteriorated  up a bit so disc low chol diet disc  rev labs and goals for tx  Her updated medication list for this problem includes:    Crestor 20 Mg Tabs (Rosuvastatin calcium) ..... One by mouth once daily  Labs Reviewed: SGOT: 19 (10/13/2010)   SGPT: 14 (10/13/2010)   HDL:62.30 (10/13/2010), 62.50 (10/04/2009)  LDL:100 (10/13/2010), 78 (60/45/4098)  Chol:178 (10/13/2010), 151 (10/04/2009)  Trig:80.0 (10/13/2010), 54.0 (10/04/2009)  Problem # 3:  DIABETES MELLITUS, TYPE II (ICD-250.00) Assessment: Unchanged stable and well controlled with metformin  no change rev low glycemic diet again  enc exercise when able  Her updated medication list for this problem includes:    Enalapril-hydrochlorothiazide 10-25 Mg Tabs (Enalapril-hydrochlorothiazide) .Marland Kitchen..Marland Kitchen Two  by mouth daily    Glucophage 500 Mg Tabs  (Metformin hcl) .Marland Kitchen... 1 by mouth two times a day  Problem # 4:  HYPERTENSION (ICD-401.9) Assessment: Deteriorated  worse but pt is very anx about funeral arrangements for a friend  re check in 3-4 wk nurse visit adj meds if needed  Her updated medication list for this problem includes:    Enalapril-hydrochlorothiazide 10-25 Mg Tabs (Enalapril-hydrochlorothiazide) .Marland Kitchen..Marland Kitchen Two  by mouth daily  BP today: 154/76 Prior BP: 130/70 (05/24/2010)  Labs Reviewed: K+: 3.3 (10/13/2010) Creat: : 0.8 (10/13/2010)   Chol: 178 (10/13/2010)   HDL: 62.30 (10/13/2010)   LDL: 100 (10/13/2010)   TG:  80.0 (10/13/2010)  Problem # 5:  ANXIETY, SITUATIONAL (ICD-308.3) Assessment: New with recent death of friend and respons for funeral arrangements  will try xanax as needed for funeral and will not drive with it   Complete Medication List: 1)  Enalapril-hydrochlorothiazide 10-25 Mg Tabs (Enalapril-hydrochlorothiazide) .... Two  by mouth daily 2)  Actonel 35 Mg Tabs (Risedronate sodium) .... One by mouth weekly 3)  Crestor 20 Mg Tabs (Rosuvastatin calcium) .... One by mouth once daily 4)  Estrace 0.1 Mg/gm Crea (Estradiol) .... Use small amount intravaginally twice weekly as directed 5)  Flonase 50 Mcg/act Susp (Fluticasone propionate) .... 2 sprays in each nostril once daily as needed 6)  Lumigan 0.03 % Soln (Bimatoprost) .... Use one drop each eye at bedtime 7)  Alphagan P 0.1 % Soln (Brimonidine tartrate) .... Use one drop in left eye two times a day 8)  Potassium Chloride Crys Cr 20 Meq Cr-tabs (Potassium chloride crys cr) .... Take one by mouth daily 9)  Glucophage 500 Mg Tabs (Metformin hcl) .Marland Kitchen.. 1 by mouth two times a day 10)  Alprazolam 0.5 Mg Tabs (Alprazolam) .Marland Kitchen.. 1 by mouth up to two times a day as needed severe anxiety  warning - this will sedate 11)  Klor-con M20 20 Meq Cr-tabs (Potassium chloride crys cr) .Marland Kitchen.. 1 by mouth once daily  Patient Instructions: 1)  the goal for your blood pressure is  130/80 2)  watch diet carefully for salt and fats  3)  try the xanax for funeral for anxiety- this will sedate so don't drive  4)  use caution with it  5)  follow up for nurse visit in 3-4 weeks when you are calmer for a bp check  6)  follow up with me for a check up (30 min) in 6 months with labs prior Prescriptions: KLOR-CON M20 20 MEQ CR-TABS (POTASSIUM CHLORIDE CRYS CR) 1 by mouth once daily  #30 x 11   Entered and Authorized by:   Judith Part MD   Signed by:   Judith Part MD on 10/20/2010   Method used:   [facility]        [id number]       [address]       [place], [place]  [address]       Ph: [phone]       Fax: [phone]   RxID:   [id number] ALPRAZOLAM 0.5 MG TABS (ALPRAZOLAM) 1 by mouth up to two times a day as needed severe anxiety  warning - this will sedate  #10 x 0   Entered and Authorized by:   Judith Part MD   Signed by:   Judith Part MD on 10/20/2010   Method used:   Print then Give to Patient   RxID:   [id number]    Orders Added: 1)  Est. Patient Level IV [34742] 2)  Prescription Created Electronically [phone]    Current Allergies (reviewed today): SULFA LIPITOR

## 2010-11-15 ENCOUNTER — Encounter: Payer: Self-pay | Admitting: Family Medicine

## 2010-11-15 ENCOUNTER — Ambulatory Visit (INDEPENDENT_AMBULATORY_CARE_PROVIDER_SITE_OTHER): Payer: Self-pay

## 2010-11-15 DIAGNOSIS — I10 Essential (primary) hypertension: Secondary | ICD-10-CM

## 2010-11-22 NOTE — Assessment & Plan Note (Addendum)
Summary: !!!!!3-4 WEEKS BP CHECK/RBH  Nurse Visit   Vital Signs:  Patient profile:   75 year old female Height:      61 inches Weight:      154.25 pounds BMI:     29.25 Temp:     97.9 degrees F oral Pulse rate:   92 / minute Pulse rhythm:   regular BP sitting:   132 / 72  (left arm) Cuff size:   large  Vitals Entered By: Lewanda Rife LPN (November 15, 2010 9:50 AM) CC: Patient present today for blood pressure check at the request of Dr Milinda Antis. Comments Relevant hx: Pt present today for BP check. Pt has been compliant with her meds and no side effects noted.No other complaints noted. Pt has been restricting salt in her diet as instructed. Pt uses [facility] if needed and can be reached at [phone].Please advise.    Allergies: 1)  Sulfa 2)  Lipitor  Orders Added: 1)  Est. Patient Level I [91478]  Appended Document: !!!!!3-4 WEEKS BP CHECK/RBH bp is better - reassuring  no changes required  Appended Document: !!!!!3-4 WEEKS BP CHECK/RBH Patient notified as instructed by telephone.

## 2010-12-16 ENCOUNTER — Other Ambulatory Visit: Payer: Self-pay | Admitting: Family Medicine

## 2011-02-22 ENCOUNTER — Other Ambulatory Visit: Payer: Self-pay | Admitting: Family Medicine

## 2011-04-15 ENCOUNTER — Telehealth: Payer: Self-pay | Admitting: Family Medicine

## 2011-04-15 DIAGNOSIS — E119 Type 2 diabetes mellitus without complications: Secondary | ICD-10-CM

## 2011-04-15 DIAGNOSIS — E7800 Pure hypercholesterolemia, unspecified: Secondary | ICD-10-CM

## 2011-04-15 DIAGNOSIS — M810 Age-related osteoporosis without current pathological fracture: Secondary | ICD-10-CM

## 2011-04-15 DIAGNOSIS — I10 Essential (primary) hypertension: Secondary | ICD-10-CM

## 2011-04-15 DIAGNOSIS — D539 Nutritional anemia, unspecified: Secondary | ICD-10-CM

## 2011-04-15 DIAGNOSIS — E876 Hypokalemia: Secondary | ICD-10-CM

## 2011-04-15 NOTE — Telephone Encounter (Signed)
Message copied by Judy Pimple on Sat Apr 15, 2011  2:50 PM ------      Message from: Baldomero Lamy      Created: Wed Apr 12, 2011 10:18 AM      Regarding: cpx labs tues 8/14       Please order  future cpx labs for pt's upcomming lab appt.      Thanks      Rodney Booze

## 2011-04-18 ENCOUNTER — Other Ambulatory Visit (INDEPENDENT_AMBULATORY_CARE_PROVIDER_SITE_OTHER): Payer: Medicare Other | Admitting: Family Medicine

## 2011-04-18 DIAGNOSIS — M810 Age-related osteoporosis without current pathological fracture: Secondary | ICD-10-CM

## 2011-04-18 DIAGNOSIS — I10 Essential (primary) hypertension: Secondary | ICD-10-CM

## 2011-04-18 DIAGNOSIS — E7800 Pure hypercholesterolemia, unspecified: Secondary | ICD-10-CM

## 2011-04-18 DIAGNOSIS — D539 Nutritional anemia, unspecified: Secondary | ICD-10-CM

## 2011-04-18 DIAGNOSIS — E876 Hypokalemia: Secondary | ICD-10-CM

## 2011-04-18 DIAGNOSIS — E119 Type 2 diabetes mellitus without complications: Secondary | ICD-10-CM

## 2011-04-18 LAB — COMPREHENSIVE METABOLIC PANEL
BUN: 17 mg/dL (ref 6–23)
CO2: 30 mEq/L (ref 19–32)
GFR: 79.86 mL/min (ref >60.00)
Glucose, Bld: 124 mg/dL — ABNORMAL HIGH (ref 70–99)
Sodium: 140 mEq/L (ref 135–145)
Total Bilirubin: 0.5 mg/dL (ref 0.3–1.2)
Total Protein: 7.3 g/dL (ref 6.0–8.3)

## 2011-04-18 LAB — CBC WITH DIFFERENTIAL/PLATELET
Basophils Relative: 1 % (ref 0.0–3.0)
Eosinophils Relative: 2.4 % (ref 0.0–5.0)
HCT: 33.9 % — ABNORMAL LOW (ref 36.0–46.0)
Hemoglobin: 11.2 g/dL — ABNORMAL LOW (ref 12.0–15.0)
Lymphs Abs: 2.1 10*3/uL (ref 0.7–4.0)
MCV: 93.4 fl (ref 78.0–100.0)
Monocytes Absolute: 0.5 10*3/uL (ref 0.1–1.0)
Monocytes Relative: 8.8 % (ref 3.0–12.0)
Neutro Abs: 2.6 10*3/uL (ref 1.4–7.7)
Platelets: 252 10*3/uL (ref 150.0–400.0)
RBC: 3.62 Mil/uL — ABNORMAL LOW (ref 3.87–5.11)
WBC: 5.3 10*3/uL (ref 4.5–10.5)

## 2011-04-18 LAB — LIPID PANEL
Cholesterol: 151 mg/dL (ref 0–200)
Triglycerides: 86 mg/dL (ref 0.0–149.0)
VLDL: 17.2 mg/dL (ref 0.0–40.0)

## 2011-04-18 LAB — TSH: TSH: 0.68 u[IU]/mL (ref 0.35–5.50)

## 2011-04-18 LAB — HEMOGLOBIN A1C: Hgb A1c MFr Bld: 6.6 % — ABNORMAL HIGH (ref 4.6–6.5)

## 2011-04-19 LAB — VITAMIN D 25 HYDROXY (VIT D DEFICIENCY, FRACTURES): Vit D, 25-Hydroxy: 21 ng/mL — ABNORMAL LOW (ref 30–89)

## 2011-04-21 ENCOUNTER — Encounter: Payer: Self-pay | Admitting: Family Medicine

## 2011-04-24 ENCOUNTER — Encounter: Payer: Self-pay | Admitting: Family Medicine

## 2011-04-24 ENCOUNTER — Ambulatory Visit (INDEPENDENT_AMBULATORY_CARE_PROVIDER_SITE_OTHER): Payer: Medicare Other | Admitting: Family Medicine

## 2011-04-24 DIAGNOSIS — I10 Essential (primary) hypertension: Secondary | ICD-10-CM

## 2011-04-24 DIAGNOSIS — E559 Vitamin D deficiency, unspecified: Secondary | ICD-10-CM | POA: Insufficient documentation

## 2011-04-24 DIAGNOSIS — Z1231 Encounter for screening mammogram for malignant neoplasm of breast: Secondary | ICD-10-CM

## 2011-04-24 DIAGNOSIS — D539 Nutritional anemia, unspecified: Secondary | ICD-10-CM

## 2011-04-24 DIAGNOSIS — E119 Type 2 diabetes mellitus without complications: Secondary | ICD-10-CM

## 2011-04-24 DIAGNOSIS — M810 Age-related osteoporosis without current pathological fracture: Secondary | ICD-10-CM

## 2011-04-24 DIAGNOSIS — H409 Unspecified glaucoma: Secondary | ICD-10-CM

## 2011-04-24 DIAGNOSIS — E7800 Pure hypercholesterolemia, unspecified: Secondary | ICD-10-CM

## 2011-04-24 MED ORDER — ERGOCALCIFEROL 1.25 MG (50000 UT) PO CAPS: 50000.0000 [IU] | ORAL_CAPSULE | ORAL | Status: DC

## 2011-04-24 MED ORDER — POTASSIUM CHLORIDE CRYS ER 20 MEQ PO TBCR: 20.0000 meq | EXTENDED_RELEASE_TABLET | Freq: Every day | ORAL | Status: DC

## 2011-04-24 NOTE — Progress Notes (Signed)
Subjective:    Patient ID: Kathy Cunningham, female    DOB: 1935-12-16, 75 y.o.   MRN: [id number]  HPI Here for check up of chronic medical problems and also to rev health mt list Is feeling good overall  No new problems but her L middle finger locks up Not bad enough to tx yet - has had this before    Wt is stable   DM stable  a1c 6.6- very well controlled On metformin  Eating less in the summer  opthy - was last sept and has appt this October - no retinopathy  Uses drops for glaucoma   Osteoporosis dexa 2/11 FN T score -2.5 Is on actonel- 4-5 years  Ca and D- not as good - takes now and then  D level is low at 21  Anemia about the same 11.2 hb  This is stable and from thalassemia   colonosc fine  Stays anemic   Lipids in great control with crestor Lab Results  Component Value Date   CHOL 151 04/18/2011   CHOL 178 10/13/2010   CHOL 151 10/04/2009   Lab Results  Component Value Date   HDL 67.40 04/18/2011   HDL 91.47 10/13/2010   HDL 62.50 10/04/2009   Lab Results  Component Value Date   LDLCALC 66 04/18/2011   LDLCALC 100* 10/13/2010   LDLCALC 78 10/04/2009   Lab Results  Component Value Date   TRIG 86.0 04/18/2011   TRIG 80.0 10/13/2010   TRIG 54.0 10/04/2009   Lab Results  Component Value Date   CHOLHDL 2 04/18/2011   CHOLHDL 3 10/13/2010   CHOLHDL 2 10/04/2009   No results found for this basename: LDLDIRECT     HTN in fair control 138/76 last check --does not check at home  No cp or sob or palpitations or ha   Zoster status  Never had shingles or shingles vaccine    pneumovax- has not had   Flu shots - cannot get that - allergic to eggs   colonosc 09 tics- told 10 y f/u  Tdap 1/11  hyst in the past --very heavy bleeding , was partial  No pain or problems  No abn pap smears   Mam nl 12/10- wants to get in November  Self exam -no lumps or changes   Patient Active Problem List  Diagnoses  . DIABETES MELLITUS, TYPE II  . HYPERCHOLESTEROLEMIA  .  HYPOKALEMIA  . ANEMIA, CHRONIC  . OTHER THALASSEMIA  . HYPERTENSION  . PREMATURE VENTRICULAR CONTRACTIONS, FREQUENT  . UTI'S, RECURRENT  . UNSPECIFIED OSTEOPOROSIS  . ANXIETY, SITUATIONAL  . Other screening mammogram  . Glaucoma  . Vitamin D deficiency   Past Medical History  Diagnosis Date  . Diabetes mellitus     type II  . Dizziness     or vertigo  . Hypertension   . Hyperlipidemia   . Anemia   . Glaucoma    Past Surgical History  Procedure Date  . Abdominal hysterectomy     uterine prolapse  . Rotator cuff repair 01/2002  . Bladder papilloma 2006   History  Substance Use Topics  . Smoking status: Never Smoker   . Smokeless tobacco: Not on file  . Alcohol Use:    Family History  Problem Relation Age of Onset  . Heart disease Mother     MI  . Heart disease Father     MI   Allergies  Allergen Reactions  . Atorvastatin  REACTION: cramps  . Sulfonamide Derivatives     REACTION: rash   Current Outpatient Prescriptions on File Prior to Visit  Medication Sig Dispense Refill  . bimatoprost (LUMIGAN) 0.03 % ophthalmic solution Place 1 drop into both eyes at bedtime.        . brimonidine (ALPHAGAN P) 0.1 % SOLN Place 1 drop into the left eye 2 (two) times daily.        . CRESTOR 20 MG tablet TAKE 1 TABLET EVERY DAY  90 tablet  0  . enalapril-hydrochlorothiazide (VASERETIC) 10-25 MG per tablet TAKE 2 TABLETS BY MOUTH DAILY  180 tablet  0  . estradiol (ESTRACE) 0.1 MG/GM vaginal cream Use small amount intravaginally twice weekly as directed.       . metFORMIN (GLUCOPHAGE) 500 MG tablet Take 500 mg by mouth 2 (two) times daily with a meal.        . ALPRAZolam (XANAX) 0.5 MG tablet One tablet by mouth up to two times a day as needed severe anxiety warning this will sedate.       . fluticasone (FLONASE) 50 MCG/ACT nasal spray Place 2 sprays into the nose daily as needed.        . risedronate (ACTONEL) 35 MG tablet Take 35 mg by mouth every 7 (seven) days. with water  on empty stomach, nothing by mouth or lie down for next 30 minutes.            Review of Systems Review of Systems  Constitutional: Negative for fever, appetite change, fatigue and unexpected weight change.  Eyes: Negative for pain and visual disturbance.  Respiratory: Negative for cough and shortness of breath.   Cardiovascular: Negative.  For cp or palpitations  Gastrointestinal: Negative for nausea, diarrhea and constipation.  Genitourinary: Negative for urgency and frequency.  Skin: Negative for pallor. or rash  Neurological: Negative for weakness, light-headedness, numbness and headaches.  Hematological: Negative for adenopathy. Does not bruise/bleed easily.  Psychiatric/Behavioral: Negative for dysphoric mood. The patient is not nervous/anxious.          Objective:   Physical Exam  Constitutional: She appears well-developed and well-nourished. No distress.  HENT:  Head: Normocephalic and atraumatic.  Right Ear: External ear normal.  Left Ear: External ear normal.  Nose: Nose normal.  Mouth/Throat: Oropharynx is clear and moist.  Eyes: Conjunctivae and EOM are normal. Pupils are equal, round, and reactive to light.  Neck: Normal range of motion. Neck supple. Carotid bruit is not present. No tracheal deviation present. No thyromegaly present.  Cardiovascular: Normal rate, regular rhythm, normal heart sounds and intact distal pulses.   Pulmonary/Chest: Effort normal and breath sounds normal. No respiratory distress. She has no wheezes. She exhibits no tenderness.  Abdominal: Soft. Bowel sounds are normal. She exhibits no distension and no mass. There is no tenderness.  Genitourinary: No breast swelling, tenderness, discharge or bleeding.  Musculoskeletal: Normal range of motion. She exhibits no edema and no tenderness.  Lymphadenopathy:    She has no cervical adenopathy.  Neurological: She is alert. She has normal reflexes. No cranial nerve deficit. Coordination normal.    Skin: Skin is warm and dry. No rash noted. No erythema. No pallor.  Psychiatric: She has a normal mood and affect.          Assessment & Plan:

## 2011-04-24 NOTE — Patient Instructions (Addendum)
Stop your actonel in February  You are due for bone density test then too- call us to schedule Try to get 1200-1500 mg of calcium per day with at least 1000 iu of vitamin D - for bone health  Also take the px vitamin D for 12 weeks  Schedule lab in 12 weeks for D level  We will see if you can get pneumonia vaccine with egg allergy - and call you  Follow up in march or April to discuss your bone density test If you are interested in shingles vaccine in future - call your insurance company to see how coverage is and call us to schedule  We will schedule mammogram at check out  Use nasal saline spray (like simply saline) - otc for sinus and ear congestion - let me know if not improved

## 2011-04-27 NOTE — Assessment & Plan Note (Signed)
Nl breast exam  Mam ordered  Enc to do regular self exam

## 2011-04-27 NOTE — Assessment & Plan Note (Signed)
Remains in good control with ace/ hct Disc low sodium diet  Rev labs Continue to follow

## 2011-04-27 NOTE — Assessment & Plan Note (Signed)
Has been well controlled on crestor No side eff Rev lab with pt  Rev low sat fat diet

## 2011-04-27 NOTE — Assessment & Plan Note (Signed)
Remains in good control  Reminded to continue opthy exams Continue metformin  Disc foot care

## 2011-05-03 ENCOUNTER — Ambulatory Visit (HOSPITAL_COMMUNITY): Payer: Medicare Other

## 2011-05-10 ENCOUNTER — Ambulatory Visit (HOSPITAL_COMMUNITY)
Admission: RE | Admit: 2011-05-10 | Discharge: 2011-05-10 | Disposition: A | Discharge status: Home / Self Care | Payer: Medicare Other | Source: Ambulatory Visit | Attending: Family Medicine | Admitting: Family Medicine

## 2011-05-10 DIAGNOSIS — Z1231 Encounter for screening mammogram for malignant neoplasm of breast: Secondary | ICD-10-CM

## 2011-05-16 ENCOUNTER — Encounter: Payer: Self-pay | Admitting: Family Medicine

## 2011-05-16 ENCOUNTER — Encounter: Payer: Self-pay | Admitting: *Deleted

## 2011-06-17 ENCOUNTER — Other Ambulatory Visit: Payer: Self-pay | Admitting: Family Medicine

## 2011-07-04 ENCOUNTER — Ambulatory Visit: Payer: Medicare Other | Admitting: Family Medicine

## 2011-07-04 ENCOUNTER — Telehealth: Payer: Self-pay | Admitting: *Deleted

## 2011-07-04 NOTE — Telephone Encounter (Signed)
Pt's daughter called back.  Pt is having abd cramps, feels like her rectum is open.

## 2011-07-04 NOTE — Telephone Encounter (Signed)
She may be getting ready to have a BM if cramping (let me know if blood or severe abd pain) I think my 4:15 appt is open today- please put her in and if she has BM before that she can cancel  In the meantime she could also try a fleets enema

## 2011-07-04 NOTE — Telephone Encounter (Signed)
Advised pt, appt scheduled for this afternoon.

## 2011-07-04 NOTE — Telephone Encounter (Signed)
Pt states she is constipated, has not had a BM since Sunday.  She took 2 dulcolax this morning, along with miralax.  This was about 5 hours ago and nothing has happened.  She is drinking lots of water.  No abd pain or distention.  Please advise.

## 2011-07-17 ENCOUNTER — Other Ambulatory Visit: Payer: Medicare Other

## 2011-07-18 ENCOUNTER — Other Ambulatory Visit (INDEPENDENT_AMBULATORY_CARE_PROVIDER_SITE_OTHER): Payer: Medicare Other

## 2011-07-18 DIAGNOSIS — Z1231 Encounter for screening mammogram for malignant neoplasm of breast: Secondary | ICD-10-CM

## 2011-07-18 DIAGNOSIS — E559 Vitamin D deficiency, unspecified: Secondary | ICD-10-CM

## 2011-07-19 LAB — VITAMIN D 25 HYDROXY (VIT D DEFICIENCY, FRACTURES): Vit D, 25-Hydroxy: 52 ng/mL (ref 30–89)

## 2011-08-18 ENCOUNTER — Other Ambulatory Visit: Payer: Self-pay | Admitting: *Deleted

## 2011-08-18 MED ORDER — ROSUVASTATIN CALCIUM 20 MG PO TABS: 20.0000 mg | ORAL_TABLET | Freq: Every day | ORAL | Status: DC

## 2011-10-09 ENCOUNTER — Telehealth: Payer: Self-pay | Admitting: Family Medicine

## 2011-10-09 DIAGNOSIS — M810 Age-related osteoporosis without current pathological fracture: Secondary | ICD-10-CM

## 2011-10-09 NOTE — Telephone Encounter (Signed)
It is time for 2 year dexa I will do a ref for that

## 2011-10-09 NOTE — Telephone Encounter (Signed)
Patient advised.

## 2011-10-09 NOTE — Telephone Encounter (Signed)
Patient's last Dexa Scan was February,2011.  Patient wants to know if it's time for her to have another Dexa Scan.  Her last Dexa Scan was done @ [facility].

## 2011-11-28 ENCOUNTER — Other Ambulatory Visit: Payer: Medicare Other

## 2011-11-28 ENCOUNTER — Ambulatory Visit (INDEPENDENT_AMBULATORY_CARE_PROVIDER_SITE_OTHER)
Admission: RE | Admit: 2011-11-28 | Discharge: 2011-11-28 | Disposition: A | Discharge status: Home / Self Care | Payer: Medicare Other | Source: Ambulatory Visit | Attending: Family Medicine | Admitting: Family Medicine

## 2011-11-28 DIAGNOSIS — M810 Age-related osteoporosis without current pathological fracture: Secondary | ICD-10-CM

## 2011-12-12 ENCOUNTER — Ambulatory Visit: Payer: Medicare Other | Admitting: Family Medicine

## 2011-12-18 ENCOUNTER — Other Ambulatory Visit: Payer: Self-pay | Admitting: Family Medicine

## 2011-12-18 LAB — HM DIABETES EYE EXAM

## 2011-12-18 NOTE — Telephone Encounter (Signed)
Patient has an appt with Dr. Milinda Antis on 12/20/2011.

## 2011-12-20 ENCOUNTER — Ambulatory Visit (INDEPENDENT_AMBULATORY_CARE_PROVIDER_SITE_OTHER): Payer: Medicare Other | Admitting: Family Medicine

## 2011-12-20 ENCOUNTER — Encounter: Payer: Self-pay | Admitting: Family Medicine

## 2011-12-20 VITALS — BP 142/72 | HR 95 | Temp 97.8°F | Ht 61.0 in | Wt 154.2 lb

## 2011-12-20 DIAGNOSIS — M810 Age-related osteoporosis without current pathological fracture: Secondary | ICD-10-CM

## 2011-12-20 MED ORDER — ESTRADIOL 0.1 MG/GM VA CREA: TOPICAL_CREAM | VAGINAL | Status: DC

## 2011-12-20 MED ORDER — ALPRAZOLAM 0.5 MG PO TABS: 0.5000 mg | ORAL_TABLET | Freq: Two times a day (BID) | ORAL | Status: DC | PRN

## 2011-12-20 NOTE — Assessment & Plan Note (Signed)
A bit worse in L FN and better in spine  Rev meds Intol of actonel after 5 y Disc plan for exercise Now on 5000 iu vit D daily Asked to add ca Will re check dexa 2 y Disc safety in detail  Handout given on OP also

## 2011-12-20 NOTE — Progress Notes (Signed)
Subjective:    Patient ID: Kathy Cunningham, female    DOB: [birth year], 76 y.o.   MRN: [id number]  HPI Here to review dexa scan   LS T score was in low normal rang - was imp from last one in 2011 with TS of -1.2  Hip however worse -- LFN -2.8 down from -2.5 in 2011 RFN was improved Sometimes L hip aches a bit   fx history-never broken any bones   Ca and D - is taking  Last D level ws good at 52  (did do high dose tx for a while)  Vit d helped her joint pain also  Is currently taking 5000 iu daily otc D3 Calcium -- only once per week (caltrate)   actonel- is off that now - was on it for 3 years -- stopped due to vomiting   fam hx - neg for OP of any type or broken bones   Gardens for exercise and also walking and cycling - quite active  Patient Active Problem List  Diagnoses  . DIABETES MELLITUS, TYPE II  . HYPERCHOLESTEROLEMIA  . HYPOKALEMIA  . ANEMIA, CHRONIC  . OTHER THALASSEMIA  . HYPERTENSION  . PREMATURE VENTRICULAR CONTRACTIONS, FREQUENT  . UTI'S, RECURRENT  . UNSPECIFIED OSTEOPOROSIS  . ANXIETY, SITUATIONAL  . Other screening mammogram  . Glaucoma  . Vitamin D deficiency   Past Medical History  Diagnosis Date  . Diabetes mellitus     type II  . Dizziness     or vertigo  . Hypertension   . Hyperlipidemia   . Anemia   . Glaucoma    Past Surgical History  Procedure Date  . Abdominal hysterectomy     uterine prolapse  . Rotator cuff repair 01/2002  . Bladder papilloma 2006   History  Substance Use Topics  . Smoking status: Never Smoker   . Smokeless tobacco: Not on file  . Alcohol Use:    Family History  Problem Relation Age of Onset  . Heart disease Mother     MI  . Heart disease Father     MI   Allergies  Allergen Reactions  . Actonel     Vomiting   . Atorvastatin     REACTION: cramps  . Sulfonamide Derivatives     REACTION: rash   Current Outpatient Prescriptions on File Prior to Visit  Medication Sig Dispense Refill  .  bimatoprost (LUMIGAN) 0.03 % ophthalmic solution Place 1 drop into both eyes at bedtime.        . brimonidine (ALPHAGAN P) 0.1 % SOLN Place 1 drop into the left eye 2 (two) times daily.        . calcium citrate (CALCITRATE - DOSED IN MG ELEMENTAL CALCIUM) 950 MG tablet Take 1 tablet by mouth 2 (two) times daily.      . enalapril-hydrochlorothiazide (VASERETIC) 10-25 MG per tablet TAKE 2 TABLETS BY MOUTH DAILY  180 tablet  1  . fluticasone (FLONASE) 50 MCG/ACT nasal spray Place 2 sprays into the nose daily as needed.        . metFORMIN (GLUCOPHAGE) 500 MG tablet TAKE 1 TABLET BY MOUTH TWICE A DAY  60 tablet  11  . potassium chloride SA (K-DUR,KLOR-CON) 20 MEQ tablet Take 1 tablet (20 mEq total) by mouth daily.  30 tablet  11  . rosuvastatin (CRESTOR) 20 MG tablet Take 1 tablet (20 mg total) by mouth daily.  90 tablet  0      Review of Systems  Review of Systems  Constitutional: Negative for fever, appetite change, fatigue and unexpected weight change.  Eyes: Negative for pain and visual disturbance.  Respiratory: Negative for cough and shortness of breath.   Cardiovascular: Negative for cp or palpitations    Gastrointestinal: Negative for nausea, diarrhea and constipation.  Genitourinary: Negative for urgency and frequency.  Skin: Negative for pallor or rash   Neurological: Negative for weakness, light-headedness, numbness and headaches.  Hematological: Negative for adenopathy. Does not bruise/bleed easily.  Psychiatric/Behavioral: Negative for dysphoric mood. The patient is not nervous/anxious.          Objective:   Physical Exam  Constitutional: She appears well-developed and well-nourished. No distress.       overwt and well appearing   HENT:  Head: Normocephalic and atraumatic.  Mouth/Throat: Oropharynx is clear and moist.  Eyes: Conjunctivae and EOM are normal. Pupils are equal, round, and reactive to light.  Neck: Normal range of motion. Neck supple. No thyromegaly present.    Cardiovascular: Normal rate and regular rhythm.   Pulmonary/Chest: Effort normal and breath sounds normal.  Musculoskeletal: She exhibits no edema.       Medium frame   No kyphosis whatsoever   Lymphadenopathy:    She has no cervical adenopathy.  Neurological: She is alert. She has normal reflexes.  Skin: Skin is warm and dry. No pallor.  Psychiatric: She has a normal mood and affect.          Assessment & Plan:

## 2011-12-20 NOTE — Patient Instructions (Signed)
Make sure you are getting at least 1200 mg of calcium daily divided into 2 doses That would mean 1 caltrate twice daily  In addition- continue your vitamin D  Also aim to walk or garden 5 days per week Careful with climing - your fracture risk is higher than average We will re check your bone density in 2 years

## 2011-12-27 ENCOUNTER — Ambulatory Visit: Payer: Medicare Other | Admitting: Family Medicine

## 2012-04-15 ENCOUNTER — Other Ambulatory Visit: Payer: Self-pay | Admitting: *Deleted

## 2012-04-15 MED ORDER — ENALAPRIL-HYDROCHLOROTHIAZIDE 10-25 MG PO TABS: 2.0000 | ORAL_TABLET | Freq: Every day | ORAL | Status: DC

## 2012-04-17 ENCOUNTER — Other Ambulatory Visit: Payer: Self-pay | Admitting: Family Medicine

## 2012-04-18 ENCOUNTER — Other Ambulatory Visit: Payer: Self-pay

## 2012-04-18 MED ORDER — ROSUVASTATIN CALCIUM 20 MG PO TABS: 20.0000 mg | ORAL_TABLET | Freq: Every day | ORAL | Status: DC

## 2012-04-18 NOTE — Telephone Encounter (Signed)
Please sched labs for lipid/ ast/alt 272 if not already scheduled Will refill electronically

## 2012-04-18 NOTE — Telephone Encounter (Signed)
Request for Crestor 20 mg last filles 08/18/11, last OV 12/20/11. Last labs 04/18/11. Ok to refill?

## 2012-06-19 LAB — HM DIABETES EYE EXAM

## 2012-07-10 ENCOUNTER — Other Ambulatory Visit: Payer: Self-pay | Admitting: *Deleted

## 2012-07-10 MED ORDER — ENALAPRIL-HYDROCHLOROTHIAZIDE 10-25 MG PO TABS: 2.0000 | ORAL_TABLET | Freq: Every day | ORAL | Status: DC

## 2012-08-30 ENCOUNTER — Other Ambulatory Visit: Payer: Self-pay | Admitting: Family Medicine

## 2012-08-30 DIAGNOSIS — Z1231 Encounter for screening mammogram for malignant neoplasm of breast: Secondary | ICD-10-CM

## 2012-09-12 ENCOUNTER — Ambulatory Visit (HOSPITAL_COMMUNITY)
Admission: RE | Admit: 2012-09-12 | Discharge: 2012-09-12 | Disposition: A | Discharge status: Home / Self Care | Payer: Medicare Other | Source: Ambulatory Visit | Attending: Family Medicine | Admitting: Family Medicine

## 2012-09-12 DIAGNOSIS — Z1231 Encounter for screening mammogram for malignant neoplasm of breast: Secondary | ICD-10-CM | POA: Insufficient documentation

## 2012-09-16 ENCOUNTER — Encounter: Payer: Self-pay | Admitting: *Deleted

## 2012-11-20 ENCOUNTER — Other Ambulatory Visit: Payer: Self-pay | Admitting: Family Medicine

## 2012-11-21 NOTE — Telephone Encounter (Signed)
No recent appt and no future appt, ok to refill? 

## 2012-11-21 NOTE — Telephone Encounter (Signed)
Please schedule f/u and refil until then   

## 2012-11-25 NOTE — Telephone Encounter (Signed)
appt scheduled for 12/11/12 and med refilled

## 2012-12-11 ENCOUNTER — Ambulatory Visit (INDEPENDENT_AMBULATORY_CARE_PROVIDER_SITE_OTHER): Payer: Medicare Other | Admitting: Family Medicine

## 2012-12-11 ENCOUNTER — Encounter: Payer: Self-pay | Admitting: Family Medicine

## 2012-12-11 VITALS — BP 140/80 | HR 98 | Temp 98.4°F | Ht 61.0 in | Wt 146.5 lb

## 2012-12-11 DIAGNOSIS — I10 Essential (primary) hypertension: Secondary | ICD-10-CM

## 2012-12-11 DIAGNOSIS — E119 Type 2 diabetes mellitus without complications: Secondary | ICD-10-CM

## 2012-12-11 DIAGNOSIS — E7800 Pure hypercholesterolemia, unspecified: Secondary | ICD-10-CM

## 2012-12-11 LAB — COMPREHENSIVE METABOLIC PANEL
ALT: 14 U/L (ref 0–35)
AST: 21 U/L (ref 0–37)
Alkaline Phosphatase: 46 U/L (ref 39–117)
BUN: 16 mg/dL (ref 6–23)
Chloride: 99 mEq/L (ref 96–112)
Creatinine, Ser: 0.8 mg/dL (ref 0.4–1.2)
Total Bilirubin: 0.9 mg/dL (ref 0.3–1.2)

## 2012-12-11 LAB — CBC WITH DIFFERENTIAL/PLATELET
Basophils Absolute: 0.1 10*3/uL (ref 0.0–0.1)
Eosinophils Absolute: 0.1 10*3/uL (ref 0.0–0.7)
Hemoglobin: 11.1 g/dL — ABNORMAL LOW (ref 12.0–15.0)
Lymphocytes Relative: 39.4 % (ref 12.0–46.0)
Lymphs Abs: 2 10*3/uL (ref 0.7–4.0)
MCHC: 32.5 g/dL (ref 30.0–36.0)
MCV: 93.4 fl (ref 78.0–100.0)
Monocytes Absolute: 0.4 10*3/uL (ref 0.1–1.0)
Neutro Abs: 2.4 10*3/uL (ref 1.4–7.7)
RDW: 13.6 % (ref 11.5–14.6)

## 2012-12-11 LAB — LIPID PANEL
Cholesterol: 161 mg/dL (ref 0–200)
LDL Cholesterol: 85 mg/dL (ref 0–99)
Triglycerides: 73 mg/dL (ref 0.0–149.0)
VLDL: 14.6 mg/dL (ref 0.0–40.0)

## 2012-12-11 LAB — HEMOGLOBIN A1C: Hgb A1c MFr Bld: 6.3 % (ref 4.6–6.5)

## 2012-12-11 MED ORDER — METFORMIN HCL 500 MG PO TABS: 500.0000 mg | ORAL_TABLET | Freq: Two times a day (BID) | ORAL | Status: DC

## 2012-12-11 MED ORDER — FLUTICASONE PROPIONATE 50 MCG/ACT NA SUSP: 2.0000 | Freq: Every day | NASAL | Status: DC | PRN

## 2012-12-11 MED ORDER — ENALAPRIL-HYDROCHLOROTHIAZIDE 10-25 MG PO TABS: 2.0000 | ORAL_TABLET | Freq: Every day | ORAL | Status: DC

## 2012-12-11 MED ORDER — POTASSIUM CHLORIDE CRYS ER 20 MEQ PO TBCR: 20.0000 meq | EXTENDED_RELEASE_TABLET | Freq: Every day | ORAL | Status: DC

## 2012-12-11 MED ORDER — ROSUVASTATIN CALCIUM 20 MG PO TABS: 20.0000 mg | ORAL_TABLET | Freq: Every day | ORAL | Status: DC

## 2012-12-11 NOTE — Assessment & Plan Note (Signed)
Lipids today Have been well controlled crestor and diet  Habits are good Disc goals and low sat fat diet

## 2012-12-11 NOTE — Assessment & Plan Note (Signed)
bp in fair control at this time  No changes needed  Disc lifstyle change with low sodium diet and exercise  Lab today 

## 2012-12-11 NOTE — Assessment & Plan Note (Signed)
Lab today Was lost to f/u for a while Good habits and wt loss opthy 4 mo ago Doing well  Lab today F/u 6 mo

## 2012-12-11 NOTE — Progress Notes (Signed)
Subjective:    Patient ID: Kathy Cunningham, female    DOB: November 05, 1935, 77 y.o.   MRN: [id number]  HPI Here for f/u of chronic health problems  Is having allergies- eyes/ runny nose   bp is stable today  No cp or palpitations or headaches or edema  No side effects to medicines  BP Readings from Last 3 Encounters:  12/11/12 140/80  12/20/11 142/72  04/24/11 130/70     Wt is down 8 lb with bmi of 27 She is doing [facility] and [facility] (loves it[facility]  Taking good care of herself  Has cut back portions a lot! And also eating healthy diet    Diabetes Home sugar results - her sugars tend to fluctuate 90s-100  DM diet - good  Exercise -- very good  Symptoms--none  A1C last  Lab Results  Component Value Date   HGBA1C 6.6* 04/18/2011    No problems with medications  Renal protection- is on ace Last eye exam - goes 3 times per year - due April 14th -no retinopathy (last visit 4 mo ago)   Hyperlipidemia On crestor and diet  Due for a check - expect good with better diet    Patient Active Problem List  Diagnosis  . DIABETES MELLITUS, TYPE II  . HYPERCHOLESTEROLEMIA  . HYPOKALEMIA  . ANEMIA, CHRONIC  . OTHER THALASSEMIA  . HYPERTENSION  . PREMATURE VENTRICULAR CONTRACTIONS, FREQUENT  . UTI'S, RECURRENT  . UNSPECIFIED OSTEOPOROSIS  . ANXIETY, SITUATIONAL  . Other screening mammogram  . Glaucoma  . Vitamin D deficiency   Past Medical History  Diagnosis Date  . Diabetes mellitus     type II  . Dizziness     or vertigo  . Hypertension   . Hyperlipidemia   . Anemia   . Glaucoma(365)    Past Surgical History  Procedure Laterality Date  . Abdominal hysterectomy      uterine prolapse  . Rotator cuff repair  01/2002  . Bladder papilloma  2006   History  Substance Use Topics  . Smoking status: Never Smoker   . Smokeless tobacco: Not on file  . Alcohol Use: No   Family History  Problem Relation Age of Onset  . Heart disease Mother     MI  .  Heart disease Father     MI   Allergies  Allergen Reactions  . Atorvastatin     REACTION: cramps  . Risedronate Sodium     Vomiting   . Sulfonamide Derivatives     REACTION: rash   Current Outpatient Prescriptions on File Prior to Visit  Medication Sig Dispense Refill  . ALPRAZolam (XANAX) 0.5 MG tablet Take 1 tablet (0.5 mg total) by mouth 2 (two) times daily as needed for sleep or anxiety. One tablet by mouth up to two times a day as needed severe anxiety warning this will sedate.  30 tablet  0  . bimatoprost (LUMIGAN) 0.03 % ophthalmic solution Place 1 drop into both eyes at bedtime.        . brimonidine (ALPHAGAN P) 0.1 % SOLN Place 1 drop into the left eye 2 (two) times daily.        . calcium citrate (CALCITRATE - DOSED IN MG ELEMENTAL CALCIUM) 950 MG tablet Take 1 tablet by mouth 2 (two) times daily.      . cholecalciferol (VITAMIN D) 1000 UNITS tablet Take 5,000 Units by mouth daily.      . enalapril-hydrochlorothiazide (VASERETIC) 10-25  MG per tablet Take 2 tablets by mouth daily. * Needs to schedule an appointment with Dr, no addition refills until seen.  180 tablet  0  . estradiol (ESTRACE) 0.1 MG/GM vaginal cream Use small amount intravaginally twice weekly as directed.  42.5 g  1  . fluticasone (FLONASE) 50 MCG/ACT nasal spray Place 2 sprays into the nose daily as needed.        Marland Kitchen KLOR-CON M20 20 MEQ tablet TAKE 1 TABLET BY MOUTH EVERY DAY  30 tablet  0  . metFORMIN (GLUCOPHAGE) 500 MG tablet TAKE 1 TABLET BY MOUTH TWICE A DAY  60 tablet  11  . rosuvastatin (CRESTOR) 20 MG tablet Take 1 tablet (20 mg total) by mouth daily.  90 tablet  3   No current facility-administered medications on file prior to visit.      Review of Systems Review of Systems  Constitutional: Negative for fever, appetite change, fatigue and unexpected weight change.  Eyes: Negative for pain and visual disturbance.  Respiratory: Negative for cough and shortness of breath.   Cardiovascular: Negative  for cp or palpitations    Gastrointestinal: Negative for nausea, diarrhea and constipation.  Genitourinary: Negative for urgency and frequency.  Skin: Negative for pallor or rash   Neurological: Negative for weakness, light-headedness, numbness and headaches.  Hematological: Negative for adenopathy. Does not bruise/bleed easily.  Psychiatric/Behavioral: Negative for dysphoric mood. The patient is not nervous/anxious.         Objective:   Physical Exam  Constitutional: She appears well-developed and well-nourished. No distress.  overwt and well app  HENT:  Head: Normocephalic and atraumatic.  Right Ear: External ear normal.  Left Ear: External ear normal.  Nose: Nose normal.  Mouth/Throat: Oropharynx is clear and moist.  Eyes: Conjunctivae and EOM are normal. Pupils are equal, round, and reactive to light. Right eye exhibits no discharge. Left eye exhibits no discharge. No scleral icterus.  Neck: Normal range of motion. Neck supple. No JVD present. Carotid bruit is not present. No thyromegaly present.  Cardiovascular: Normal rate, regular rhythm, normal heart sounds and intact distal pulses.  Exam reveals no gallop.   Pulmonary/Chest: Effort normal and breath sounds normal. No respiratory distress. She has no wheezes.  Abdominal: Soft. Bowel sounds are normal. She exhibits no distension, no abdominal bruit and no mass. There is no tenderness.  Musculoskeletal: She exhibits no edema and no tenderness.  Lymphadenopathy:    She has no cervical adenopathy.  Neurological: She has normal reflexes. No cranial nerve deficit. She exhibits normal muscle tone. Coordination normal.  Skin: Skin is warm and dry. No rash noted. No erythema. No pallor.  Psychiatric: She has a normal mood and affect.          Assessment & Plan:

## 2012-12-11 NOTE — Patient Instructions (Signed)
Labs today  Keep up the good job with healthy diet and exercise Follow up in 6 months for annual exam with labs prior

## 2012-12-12 ENCOUNTER — Encounter: Payer: Self-pay | Admitting: *Deleted

## 2012-12-16 LAB — HM DIABETES EYE EXAM

## 2013-04-17 ENCOUNTER — Emergency Department (HOSPITAL_COMMUNITY): Payer: Medicare Other

## 2013-04-17 ENCOUNTER — Emergency Department (HOSPITAL_COMMUNITY)
Admission: EM | Admit: 2013-04-17 | Discharge: 2013-04-17 | Disposition: A | Discharge status: Home / Self Care | Payer: Medicare Other | Attending: Emergency Medicine | Admitting: Emergency Medicine

## 2013-04-17 ENCOUNTER — Encounter (HOSPITAL_COMMUNITY): Payer: Self-pay | Admitting: *Deleted

## 2013-04-17 DIAGNOSIS — E119 Type 2 diabetes mellitus without complications: Secondary | ICD-10-CM | POA: Insufficient documentation

## 2013-04-17 DIAGNOSIS — Z79899 Other long term (current) drug therapy: Secondary | ICD-10-CM | POA: Insufficient documentation

## 2013-04-17 DIAGNOSIS — H409 Unspecified glaucoma: Secondary | ICD-10-CM | POA: Insufficient documentation

## 2013-04-17 DIAGNOSIS — I10 Essential (primary) hypertension: Secondary | ICD-10-CM | POA: Insufficient documentation

## 2013-04-17 DIAGNOSIS — R000 Tachycardia, unspecified: Secondary | ICD-10-CM

## 2013-04-17 DIAGNOSIS — I498 Other specified cardiac arrhythmias: Secondary | ICD-10-CM | POA: Insufficient documentation

## 2013-04-17 DIAGNOSIS — IMO0002 Reserved for concepts with insufficient information to code with codable children: Secondary | ICD-10-CM | POA: Insufficient documentation

## 2013-04-17 DIAGNOSIS — Z862 Personal history of diseases of the blood and blood-forming organs and certain disorders involving the immune mechanism: Secondary | ICD-10-CM | POA: Insufficient documentation

## 2013-04-17 DIAGNOSIS — E785 Hyperlipidemia, unspecified: Secondary | ICD-10-CM | POA: Insufficient documentation

## 2013-04-17 DIAGNOSIS — R002 Palpitations: Secondary | ICD-10-CM | POA: Insufficient documentation

## 2013-04-17 DIAGNOSIS — E876 Hypokalemia: Secondary | ICD-10-CM | POA: Insufficient documentation

## 2013-04-17 LAB — COMPREHENSIVE METABOLIC PANEL
Alkaline Phosphatase: 59 U/L (ref 39–117)
BUN: 16 mg/dL (ref 6–23)
CO2: 28 mEq/L (ref 19–32)
Calcium: 9.7 mg/dL (ref 8.4–10.5)
GFR calc Af Amer: 77 mL/min — ABNORMAL LOW (ref >90)
GFR calc non Af Amer: 66 mL/min — ABNORMAL LOW (ref >90)
Glucose, Bld: 182 mg/dL — ABNORMAL HIGH (ref 70–99)
Total Protein: 7.9 g/dL (ref 6.0–8.3)

## 2013-04-17 LAB — CBC WITH DIFFERENTIAL/PLATELET
Basophils Absolute: 0 10*3/uL (ref 0.0–0.1)
Basophils Relative: 0 % (ref 0–1)
HCT: 34.7 % — ABNORMAL LOW (ref 36.0–46.0)
Hemoglobin: 11.8 g/dL — ABNORMAL LOW (ref 12.0–15.0)
Lymphocytes Relative: 32 % (ref 12–46)
MCHC: 34 g/dL (ref 30.0–36.0)
Monocytes Absolute: 0.5 10*3/uL (ref 0.1–1.0)
Neutro Abs: 4.4 10*3/uL (ref 1.7–7.7)
Neutrophils Relative %: 59 % (ref 43–77)
RDW: 13.2 % (ref 11.5–15.5)
WBC: 7.4 10*3/uL (ref 4.0–10.5)

## 2013-04-17 LAB — POCT I-STAT TROPONIN I: Troponin i, poc: 0.01 ng/mL (ref 0.00–0.08)

## 2013-04-17 MED ORDER — POTASSIUM CHLORIDE CRYS ER 20 MEQ PO TBCR
40.0000 meq | EXTENDED_RELEASE_TABLET | Freq: Once | ORAL | Status: AC
  Administered 2013-04-17: 40 meq via ORAL
  Filled 2013-04-17: qty 2

## 2013-04-17 MED ORDER — SODIUM CHLORIDE 0.9 % IV BOLUS (SEPSIS)
1000.0000 mL | Freq: Once | INTRAVENOUS | Status: AC
  Administered 2013-04-17: 1000 mL via INTRAVENOUS

## 2013-04-17 MED ORDER — POTASSIUM CHLORIDE 10 MEQ/100ML IV SOLN
10.0000 meq | Freq: Once | INTRAVENOUS | Status: AC
  Administered 2013-04-17: 10 meq via INTRAVENOUS
  Filled 2013-04-17: qty 100

## 2013-04-17 NOTE — ED Notes (Signed)
Ambulated pt to the bathroom. Pt ambulated alone and did well.

## 2013-04-17 NOTE — ED Notes (Signed)
MD at bedside. 

## 2013-04-17 NOTE — ED Notes (Signed)
Family at bedside. 

## 2013-04-17 NOTE — ED Notes (Signed)
Patient went to urgent care for evaluation of a epidermoid cyst to right shoulder. Cysts material removed by provider. Incidentally, it was noted that patient had elevated HR in the 120s and hypertensive. Patient then reported that she had been having palpitations since this AM. Patient reports that she had an episode of palpitations once last week that resolved on its own. Patient has never been evaluated for this before. No known hx arrhythmias. Denies fevers, CP, cough, lower extremity swelling, SOB, abdominal pain, dizziness, headaches or dysuria.

## 2013-04-17 NOTE — ED Notes (Signed)
Pt was seen at an UC for a lump to R shoulder.  However, they found her to be tachycardic.  Presently sinus tach with PAC's.  Pt states she feels her heart has been skipping beats and she has felt she needs to take deep breaths because she feels jittery.

## 2013-04-17 NOTE — ED Notes (Signed)
Portable CXR at bedside.

## 2013-04-18 NOTE — ED Provider Notes (Signed)
CSN: [id number]     Arrival date & time 04/17/13  1837 History     First MD Initiated Contact with Patient 04/17/13 1915     Chief Complaint  Patient presents with  . Palpitations  . Tachycardia   (Consider location/radiation/quality/duration/timing/severity/associated sxs/prior Treatment) Patient is a 77 y.o. female presenting with palpitations. The history is provided by the patient. No language interpreter was used.  Palpitations Palpitations quality:  Irregular Onset quality:  Sudden Timing:  Intermittent Progression:  Waxing and waning Chronicity:  New Context comment:  Unknown Relieved by:  Nothing Worsened by:  Nothing tried Ineffective treatments:  None tried Associated symptoms: no back pain, no chest pain, no chest pressure, no cough, no diaphoresis, no leg pain, no nausea, no numbness, no shortness of breath, no vomiting and no weakness   Risk factors: diabetes mellitus     Past Medical History  Diagnosis Date  . Diabetes mellitus     type II  . Dizziness     or vertigo  . Hypertension   . Hyperlipidemia   . Anemia   . Glaucoma    Past Surgical History  Procedure Laterality Date  . Abdominal hysterectomy      uterine prolapse  . Rotator cuff repair  01/2002  . Bladder papilloma  2006   Family History  Problem Relation Age of Onset  . Heart disease Mother     MI  . Heart disease Father     MI   History  Substance Use Topics  . Smoking status: Never Smoker   . Smokeless tobacco: Not on file  . Alcohol Use: No   OB History   Grav Para Term Preterm Abortions TAB SAB Ect Mult Living                 Review of Systems  Constitutional: Negative for fever, chills, diaphoresis, activity change, appetite change and fatigue.  HENT: Negative for congestion, sore throat, facial swelling, rhinorrhea, neck pain and neck stiffness.   Eyes: Negative for photophobia and discharge.  Respiratory: Negative for cough, chest tightness and shortness of breath.    Cardiovascular: Positive for palpitations. Negative for chest pain and leg swelling.  Gastrointestinal: Negative for nausea, vomiting, abdominal pain and diarrhea.  Endocrine: Negative for polydipsia and polyuria.  Genitourinary: Negative for dysuria, frequency, difficulty urinating and pelvic pain.  Musculoskeletal: Negative for back pain and arthralgias.  Skin: Negative for color change and wound.  Allergic/Immunologic: Negative for immunocompromised state.  Neurological: Negative for facial asymmetry, weakness, numbness and headaches.  Hematological: Does not bruise/bleed easily.  Psychiatric/Behavioral: Negative for confusion and agitation.    Allergies  Atorvastatin; Risedronate sodium; and Sulfonamide derivatives  Home Medications   Current Outpatient Rx  Name  Route  Sig  Dispense  Refill  . bimatoprost (LUMIGAN) 0.03 % ophthalmic solution   Both Eyes   Place 1 drop into both eyes at bedtime.           . brimonidine (ALPHAGAN P) 0.1 % SOLN   Left Eye   Place 1 drop into the left eye 2 (two) times daily.           . calcium citrate (CALCITRATE - DOSED IN MG ELEMENTAL CALCIUM) 950 MG tablet   Oral   Take 1 tablet by mouth 2 (two) times daily.         . cholecalciferol (VITAMIN D) 1000 UNITS tablet   Oral   Take 2,000 Units by mouth daily.          Marland Kitchen  enalapril-hydrochlorothiazide (VASERETIC) 10-25 MG per tablet   Oral   Take 2 tablets by mouth daily. .   60 tablet   11   . estradiol (ESTRACE) 0.1 MG/GM vaginal cream      Use small amount intravaginally twice weekly as directed.   42.5 g   1   . fluticasone (FLONASE) 50 MCG/ACT nasal spray   Nasal   Place 2 sprays into the nose daily as needed.   16 g   11   . metFORMIN (GLUCOPHAGE) 500 MG tablet   Oral   Take 1 tablet (500 mg total) by mouth 2 (two) times daily with a meal.   60 tablet   11   . potassium chloride SA (KLOR-CON M20) 20 MEQ tablet   Oral   Take 1 tablet (20 mEq total) by mouth  daily.   30 tablet   11   . rosuvastatin (CRESTOR) 20 MG tablet   Oral   Take 1 tablet (20 mg total) by mouth daily.   30 tablet   11    BP 133/84  Pulse 107  Temp(Src) 98 F (36.7 C) (Oral)  Resp 16  SpO2 98% Physical Exam  Constitutional: She is oriented to person, place, and time. She appears well-developed and well-nourished. No distress.  HENT:  Head: Normocephalic and atraumatic.  Mouth/Throat: No oropharyngeal exudate.  Eyes: Pupils are equal, round, and reactive to light.  Neck: Normal range of motion. Neck supple.  Cardiovascular: Regular rhythm and normal heart sounds.  Tachycardia present.  Exam reveals no gallop and no friction rub.   No murmur heard. Pulmonary/Chest: Effort normal and breath sounds normal. No respiratory distress. She has no wheezes. She has no rales.  Abdominal: Soft. Bowel sounds are normal. She exhibits no distension and no mass. There is no tenderness. There is no rebound and no guarding.  Musculoskeletal: Normal range of motion. She exhibits no edema and no tenderness.  Neurological: She is alert and oriented to person, place, and time.  Skin: Skin is warm and dry.  Psychiatric: She has a normal mood and affect.    ED Course   Procedures (including critical care time)  Labs Reviewed  COMPREHENSIVE METABOLIC PANEL - Abnormal; Notable for the following:    Potassium 2.9 (*)    Glucose, Bld 182 (*)    GFR calc non Af Amer 66 (*)    GFR calc Af Amer 77 (*)    All other components within normal limits  CBC WITH DIFFERENTIAL - Abnormal; Notable for the following:    RBC 3.82 (*)    Hemoglobin 11.8 (*)    HCT 34.7 (*)    All other components within normal limits  TSH  POCT I-STAT TROPONIN I   Dg Chest Portable 1 View  04/17/2013   *RADIOLOGY REPORT*  Clinical Data: Palpitations, history of hypertension  PORTABLE CHEST - 1 VIEW  Comparison: None.  Findings: Bilateral glenohumeral and right AC joint degenerative change noted.  Heart size  normal.  The lungs are clear.  No pleural effusion.  IMPRESSION: No acute cardiopulmonary process.   Original Report Authenticated By: Christiana Pellant, M.D.   1. Sinus tachycardia   2. Hypokalemia     MDM   Pt is a 77 y.o. female with Pmhx as above who presents with tachycardic as sent from UC.  Pt reports palpitations and feeling heart beating too fast.  She sometimes feels she has to forceibly take a deep breathing, but is not SOb per  say.  No CP, leg swelling.  She has poor PO intake per family.  EKG showed sinus tach w/ PAC.  Trop not elevated, CXR negative.  Tachycardia resolved after 2L NS.  I believe tachycardia due to volume status.  K also low and pt admits to being noncompliant with home supplement.  PO replacement given here and pt will take K at home. Will d/c home w/ return precautions for new or worsening symptoms including CP, SOB, leg swelling.   1. Sinus tachycardia   2. Hypokalemia       Shanna Cisco, MD 04/18/13 [id number]

## 2013-04-20 ENCOUNTER — Encounter (HOSPITAL_COMMUNITY): Payer: Self-pay | Admitting: Emergency Medicine

## 2013-04-20 ENCOUNTER — Emergency Department (HOSPITAL_COMMUNITY): Payer: Medicare Other

## 2013-04-20 ENCOUNTER — Emergency Department (HOSPITAL_COMMUNITY)
Admission: EM | Admit: 2013-04-20 | Discharge: 2013-04-20 | Disposition: A | Discharge status: Home / Self Care | Payer: Medicare Other | Attending: Emergency Medicine | Admitting: Emergency Medicine

## 2013-04-20 DIAGNOSIS — Z862 Personal history of diseases of the blood and blood-forming organs and certain disorders involving the immune mechanism: Secondary | ICD-10-CM | POA: Insufficient documentation

## 2013-04-20 DIAGNOSIS — IMO0002 Reserved for concepts with insufficient information to code with codable children: Secondary | ICD-10-CM | POA: Insufficient documentation

## 2013-04-20 DIAGNOSIS — R002 Palpitations: Secondary | ICD-10-CM | POA: Insufficient documentation

## 2013-04-20 DIAGNOSIS — Z79899 Other long term (current) drug therapy: Secondary | ICD-10-CM | POA: Insufficient documentation

## 2013-04-20 DIAGNOSIS — E119 Type 2 diabetes mellitus without complications: Secondary | ICD-10-CM | POA: Insufficient documentation

## 2013-04-20 DIAGNOSIS — R000 Tachycardia, unspecified: Secondary | ICD-10-CM

## 2013-04-20 DIAGNOSIS — I498 Other specified cardiac arrhythmias: Secondary | ICD-10-CM | POA: Insufficient documentation

## 2013-04-20 DIAGNOSIS — E785 Hyperlipidemia, unspecified: Secondary | ICD-10-CM | POA: Insufficient documentation

## 2013-04-20 DIAGNOSIS — R42 Dizziness and giddiness: Secondary | ICD-10-CM | POA: Insufficient documentation

## 2013-04-20 DIAGNOSIS — H409 Unspecified glaucoma: Secondary | ICD-10-CM | POA: Insufficient documentation

## 2013-04-20 DIAGNOSIS — I10 Essential (primary) hypertension: Secondary | ICD-10-CM | POA: Insufficient documentation

## 2013-04-20 LAB — URINALYSIS, ROUTINE W REFLEX MICROSCOPIC
Bilirubin Urine: NEGATIVE
Nitrite: NEGATIVE
Protein, ur: NEGATIVE mg/dL
Specific Gravity, Urine: 1.005 (ref 1.005–1.030)
Urobilinogen, UA: 0.2 mg/dL (ref 0.0–1.0)

## 2013-04-20 LAB — POCT I-STAT, CHEM 8
BUN: 10 mg/dL (ref 6–23)
Calcium, Ion: 1.21 mmol/L (ref 1.13–1.30)
Creatinine, Ser: 1 mg/dL (ref 0.50–1.10)
Glucose, Bld: 117 mg/dL — ABNORMAL HIGH (ref 70–99)
Hemoglobin: 12.6 g/dL (ref 12.0–15.0)
Sodium: 139 mEq/L (ref 135–145)
TCO2: 30 mmol/L (ref 0–100)

## 2013-04-20 LAB — POCT I-STAT TROPONIN I

## 2013-04-20 LAB — D-DIMER, QUANTITATIVE: D-Dimer, Quant: 0.57 ug/mL-FEU — ABNORMAL HIGH (ref 0.00–0.48)

## 2013-04-20 MED ORDER — METOPROLOL TARTRATE 50 MG PO TABS: 50.0000 mg | ORAL_TABLET | Freq: Two times a day (BID) | ORAL | Status: DC

## 2013-04-20 MED ORDER — LABETALOL HCL 5 MG/ML IV SOLN
20.0000 mg | Freq: Once | INTRAVENOUS | Status: AC
  Administered 2013-04-20: 20 mg via INTRAVENOUS
  Filled 2013-04-20: qty 4

## 2013-04-20 MED ORDER — SODIUM CHLORIDE 0.9 % IV BOLUS (SEPSIS)
500.0000 mL | Freq: Once | INTRAVENOUS | Status: AC
  Administered 2013-04-20: 500 mL via INTRAVENOUS

## 2013-04-20 MED ORDER — METOPROLOL TARTRATE 25 MG PO TABS
50.0000 mg | ORAL_TABLET | Freq: Once | ORAL | Status: AC
  Administered 2013-04-20: 50 mg via ORAL
  Filled 2013-04-20: qty 2

## 2013-04-20 MED ORDER — IOHEXOL 350 MG/ML SOLN
60.0000 mL | Freq: Once | INTRAVENOUS | Status: AC | PRN
  Administered 2013-04-20: 60 mL via INTRAVENOUS

## 2013-04-20 MED ORDER — SODIUM CHLORIDE 0.9 % IV BOLUS (SEPSIS)
1000.0000 mL | Freq: Once | INTRAVENOUS | Status: AC
  Administered 2013-04-20: 1000 mL via INTRAVENOUS

## 2013-04-20 NOTE — ED Provider Notes (Signed)
TIME SEEN: 11:54 AM  CHIEF COMPLAINT: Palpitations  HPI: Patient is a 77 year old female with a history of diabetes, hyperlipidemia, hypertension who presents emergency department with palpitations. She had similar symptoms 3 days ago and was evaluated and had a potassium of 2.9 which was replaced and she was discharged home. Hypokalemia thought secondary to use of diuretics. She states that her symptoms have come back or present since last night. They're not associated with chest pain or shortness of breath. She has had mild lightheadedness. No recent fever, cough, vomiting, diarrhea, bloody stools or melena, vaginal bleeding, dysuria or hematuria. No neurologic deficits. No prior history of PE or DVT. No recent prolonged immobilization, fracture, surgery, trauma. Patient is on a vaginal estrogen cream. No history of tobacco use.  ROS: See HPI Constitutional: no fever  Eyes: no drainage  ENT: no runny nose   Cardiovascular:  no chest pain  Resp: no SOB  GI: no vomiting GU: no dysuria Integumentary: no rash  Allergy: no hives  Musculoskeletal: no leg swelling  Neurological: no slurred speech ROS otherwise negative  PAST MEDICAL HISTORY/PAST SURGICAL HISTORY:  Past Medical History  Diagnosis Date  . Diabetes mellitus     type II  . Dizziness     or vertigo  . Hypertension   . Hyperlipidemia   . Anemia   . Glaucoma     MEDICATIONS:  Prior to Admission medications   Medication Sig Start Date End Date Taking? Authorizing Provider  bimatoprost (LUMIGAN) 0.03 % ophthalmic solution Place 1 drop into both eyes at bedtime.      Historical Provider, MD  brimonidine (ALPHAGAN P) 0.1 % SOLN Place 1 drop into the left eye 2 (two) times daily.      Historical Provider, MD  calcium citrate (CALCITRATE - DOSED IN MG ELEMENTAL CALCIUM) 950 MG tablet Take 1 tablet by mouth 2 (two) times daily.    Historical Provider, MD  cholecalciferol (VITAMIN D) 1000 UNITS tablet Take 2,000 Units by mouth  daily.     Historical Provider, MD  enalapril-hydrochlorothiazide (VASERETIC) 10-25 MG per tablet Take 2 tablets by mouth daily. . 12/11/12   Judy Pimple, MD  estradiol (ESTRACE) 0.1 MG/GM vaginal cream Use small amount intravaginally twice weekly as directed. 12/20/11   Judy Pimple, MD  fluticasone (FLONASE) 50 MCG/ACT nasal spray Place 2 sprays into the nose daily as needed. 12/11/12   Judy Pimple, MD  metFORMIN (GLUCOPHAGE) 500 MG tablet Take 1 tablet (500 mg total) by mouth 2 (two) times daily with a meal. 12/11/12   Judy Pimple, MD  potassium chloride SA (KLOR-CON M20) 20 MEQ tablet Take 1 tablet (20 mEq total) by mouth daily. 12/11/12   Judy Pimple, MD  rosuvastatin (CRESTOR) 20 MG tablet Take 1 tablet (20 mg total) by mouth daily. 12/11/12   Judy Pimple, MD    ALLERGIES:  Allergies  Allergen Reactions  . Atorvastatin     REACTION: cramps  . Risedronate Sodium     Vomiting   . Sulfonamide Derivatives     REACTION: rash    SOCIAL HISTORY:  History  Substance Use Topics  . Smoking status: Never Smoker   . Smokeless tobacco: Not on file  . Alcohol Use: No    FAMILY HISTORY: Family History  Problem Relation Age of Onset  . Heart disease Mother     MI  . Heart disease Father     MI    EXAM: BP 169/69  Pulse 116  Temp(Src) 98.2 F (36.8 C) (Oral)  Resp 16  Ht 5\' 2"  (1.575 m)  Wt 146 lb (66.225 kg)  BMI 26.7 kg/m2  SpO2 98% CONSTITUTIONAL: Alert and oriented and responds appropriately to questions. Well-appearing; well-nourished HEAD: Normocephalic EYES: Conjunctivae clear, PERRL ENT: normal nose; no rhinorrhea; moist mucous membranes; pharynx without lesions noted NECK: Supple, no meningismus, no LAD  CARD: Tachycardic; S1 and S2 appreciated; no murmurs, no clicks, no rubs, no gallops RESP: Normal chest excursion without splinting or tachypnea; breath sounds clear and equal bilaterally; no wheezes, no rhonchi, no rales,  ABD/GI: Normal bowel sounds;  non-distended; soft, non-tender, no rebound, no guarding BACK:  The back appears normal and is non-tender to palpation, there is no CVA tenderness EXT: Normal ROM in all joints; non-tender to palpation; no edema; normal capillary refill; no cyanosis    SKIN: Normal color for age and race; warm NEURO: Moves all extremities equally PSYCH: The patient's mood and manner are appropriate. Grooming and personal hygiene are appropriate.  MEDICAL DECISION MAKING: Patient with sinus tachycardia with a rate in the 120s. She has no other associated symptoms besides lightheadedness. She is hemodynamically stable otherwise. Workup 3 days ago revealed a potassium of 2.9 and patient reports she has been taking her oral replacement. We'll repeat labs to evaluate for anemia, electrolyte abnormality. We'll also add on d-dimer to rule out pulmonary embolus, TSH. I do not feel a chest x-ray need to repeat at this time as her chest x-ray 3 days ago was normal. Will give IV fluids.  ED PROGRESS: The patient's labs are unremarkable. No electrolyte abnormality. Hemoglobin is normal. D-dimer is positive. We'll obtain CT of patient's chest to rule out pulmonary embolus.  3:14 PM  Pt's CT scan shows no pulmonary embolus. Her heart rate however at rest is now in the 140s, still sinus tachycardia. Patient reports she does feel tired with ambulation but this has been present for some time.  No shortness of breath. She does also report some lightheadedness intermittently but not always with palpiations. Denies any chest pain. Unclear cause for patient's tachycardia. TSH pending but no other signs or symptoms of hyperthyroidism.  Will discuss with on-call cardiology.  3:19 PM  Spoke with Dr. Daleen Squibb with on call cardiology who reviewed case and EKG.  Recommends starting beta blocker and giving more IVF.  Does not think this is ischemic or she would have changes on EKG and elevated troponin now.  Does not feel she needs admission.  He  recommends outpatient followup.  3:43 PM  Pt HR now in 90s.  BP stable.  Still asymptomatic otherwise.  Will give oral metoprolol for rate control and discuss the patient's primary care provider Dr. Milinda Antis.  4:16 PM  Spoke with Dr. Beverely Low with [facility] was on call for Dr. Milinda Antis. She was in Dr. Beola Cord message and have the patient followup tomorrow. Recommends metoprolol tartrate 50 mg twice daily.  Given patient strict return precautions. She verbalized understanding is comfortable with plan.  Layla Maw Michaelia Beilfuss, DO 04/20/13 [phone]

## 2013-04-20 NOTE — ED Notes (Signed)
Per daughter pt has been getting light headed last week. Pt states she feels "jittery".

## 2013-04-20 NOTE — ED Notes (Signed)
Dr. Milinda Antis paged to Dr. Elesa Massed

## 2013-04-20 NOTE — ED Notes (Signed)
Cardio call per Dr. Elesa Massed

## 2013-04-20 NOTE — ED Notes (Signed)
Pt states she picked up a laundry basket and began having palpitations. Pt states she was seen here on Thursday for same symptoms. Pt denies any cp, sob, or dizziness. Pt alert and oriented. Dr. Elesa Massed at bedside.

## 2013-04-20 NOTE — ED Notes (Addendum)
Pt reports had palpitations at 1000 today while doing laundry. Pt denies shortness of breath, diaphoresis, N/V or dizziness. Pt had same symptoms on Thursday and was seen here.

## 2013-04-21 ENCOUNTER — Encounter: Payer: Self-pay | Admitting: Family Medicine

## 2013-04-21 ENCOUNTER — Telehealth: Payer: Self-pay | Admitting: Family Medicine

## 2013-04-21 ENCOUNTER — Ambulatory Visit (INDEPENDENT_AMBULATORY_CARE_PROVIDER_SITE_OTHER): Payer: Medicare Other | Admitting: Family Medicine

## 2013-04-21 VITALS — BP 130/65 | HR 83 | Temp 99.1°F | Ht 61.0 in | Wt 146.2 lb

## 2013-04-21 DIAGNOSIS — I10 Essential (primary) hypertension: Secondary | ICD-10-CM

## 2013-04-21 DIAGNOSIS — R000 Tachycardia, unspecified: Secondary | ICD-10-CM | POA: Insufficient documentation

## 2013-04-21 NOTE — Telephone Encounter (Signed)
[facility] Triage Call Report Triage Record Num: [id number] Operator: Griselda Miner Patient Name: Kathy Cunningham Call Date & Time: 04/20/2013 10:05:26AM Patient Phone: [phone] PCP: Patient Gender: Female PCP Fax : Patient DOB: Mar 05, 1936 Practice Name: [facility] Reason for Call: Caller: November/Patient; PCP: Roxy Manns [facility]); CB#: [phone]; Call regarding Seen in ED for Heart, Now Pulse is high, then low; Went to ED 08/14 due to feeling heart racing. Noted that she was low in potassium level was 2.9. She restarted taking potassium daily after this. She feels like she is nervous-(internally) and feels like she looses her thought for a moment. Daughter present with RN giving her rough instruction on how to count pulse for 60 seconds. The number she got was 41 but she stated it felt like there was "flutter" like sensations between strong beat. During count patient was asymptomatic. Triaged using irregular heartbeat due with disposition to see ED immediately due to rate is suddenly more irregular and history of CAD (HTN,DM). Additionally concerned about irregularity with recent history of Potassium levels low. Care advice given. Daughter will take to [facility] for evaluation. Protocol(s) Used: Irregular Heartbeat Recommended Outcome per Protocol: See ED Immediately Reason for Outcome: Pulse rate is suddenly more irregular or suddenly more than 100 beats/minute at rest AND known coronary artery disease (history of myocardial infraction (MI), angina or cardiac surgery) Care Advice: ~ Another adult should drive. ~ Place person in a position of comfort and loosen tight clothing. Call EMS 911 if having chest pain, sudden severe shortness of breath, loss of consciousness, or signs of shock (such as unable to stand due to faintness, dizziness, or lightheadedness; new onset of confusion; slow to respond or difficult to awaken; skin is pale, gray, cool, or moist to touch;  severe weakness). ~ 04/20/2013 10:34:48AM Page 1 of 1 [email or web address]

## 2013-04-21 NOTE — Patient Instructions (Addendum)
Continue the metoprolol I'm glad you are doing better Spot on your back looks fine  We will do cardiology referral at check out

## 2013-04-21 NOTE — Assessment & Plan Note (Signed)
bp in fair control at this time  No changes needed  Disc lifstyle change with low sodium diet and exercise   Tolerating beta blocker without problem

## 2013-04-21 NOTE — Assessment & Plan Note (Signed)
Several episodes -one with hypokalemia and one after correction of it Rev hosp notes and labs/ studies in detail with pt today  Doing better on beta blocker - feels fine Ref to cardiol Disc update if symptoms return or side eff

## 2013-04-21 NOTE — Progress Notes (Signed)
Subjective:    Patient ID: Kathy Cunningham, female    DOB: 02/16/1936, 77 y.o.   MRN: [id number]  HPI Pt is here for ER f/u She was seen in ER several times in last wk for rapid hr  First visit K2.9- corrected and she was sent home  2nd visit- no source found/ pos DD, neg CT of the chest  Rate 120s Cardiology rev the case and started her on metoprolol   Today she feels much better ! - tolerates BBlocker Did not sleep well last night     Chemistry      Component Value Date/Time   NA 139 04/20/2013 1216   K 3.9 04/20/2013 1216   CL 101 04/20/2013 1216   CO2 28 04/17/2013 1901   BUN 10 04/20/2013 1216   CREATININE 1.00 04/20/2013 1216      Component Value Date/Time   CALCIUM 9.7 04/17/2013 1901   ALKPHOS 59 04/17/2013 1901   AST 26 04/17/2013 1901   ALT 11 04/17/2013 1901   BILITOT 0.4 04/17/2013 1901      Lab Results  Component Value Date   HGBA1C 6.3 12/11/2012    No additional stress lately  This is the month her sister died - ? If that made her out ot whack She has been active- walking and working in the garden   Wed she had a bug bite after going to the garden- a pustule (went to [facility] urgent care)- was supposed to get abx and never did because EKG was abn and they sent her to the hospital   Patient Active Problem List   Diagnosis Date Noted  . Tachycardia 04/21/2013  . Other screening mammogram 04/24/2011  . Glaucoma 04/24/2011  . Vitamin D deficiency 04/24/2011  . ANXIETY, SITUATIONAL 10/20/2010  . HYPOKALEMIA 10/22/2009  . OTHER THALASSEMIA 06/10/2008  . UNSPECIFIED OSTEOPOROSIS 05/01/2008  . DIABETES MELLITUS, TYPE II 07/17/2007  . HYPERCHOLESTEROLEMIA 07/17/2007  . ANEMIA, CHRONIC 07/17/2007  . HYPERTENSION 07/17/2007  . PREMATURE VENTRICULAR CONTRACTIONS, FREQUENT 07/17/2007  . UTI'S, RECURRENT 07/17/2007   Past Medical History  Diagnosis Date  . Diabetes mellitus     type II  . Dizziness     or vertigo  . Hypertension   . Hyperlipidemia   .  Anemia   . Glaucoma    Past Surgical History  Procedure Laterality Date  . Abdominal hysterectomy      uterine prolapse  . Rotator cuff repair  01/2002  . Bladder papilloma  2006   History  Substance Use Topics  . Smoking status: Never Smoker   . Smokeless tobacco: Not on file  . Alcohol Use: No   Family History  Problem Relation Age of Onset  . Heart disease Mother     MI  . Heart disease Father     MI   Allergies  Allergen Reactions  . Atorvastatin     REACTION: cramps  . Risedronate Sodium     Vomiting   . Sulfonamide Derivatives     REACTION: rash   Current Outpatient Prescriptions on File Prior to Visit  Medication Sig Dispense Refill  . bimatoprost (LUMIGAN) 0.03 % ophthalmic solution Place 1 drop into both eyes at bedtime.        . brimonidine (ALPHAGAN P) 0.1 % SOLN Place 1 drop into the left eye 2 (two) times daily.        . calcium citrate (CALCITRATE - DOSED IN MG ELEMENTAL CALCIUM) 950 MG tablet Take 1 tablet  by mouth 2 (two) times daily.      . cholecalciferol (VITAMIN D) 1000 UNITS tablet Take 2,000 Units by mouth daily.       . enalapril-hydrochlorothiazide (VASERETIC) 10-25 MG per tablet Take 2 tablets by mouth daily. .  60 tablet  11  . estradiol (ESTRACE) 0.1 MG/GM vaginal cream Use small amount intravaginally twice weekly as directed.  42.5 g  1  . fluticasone (FLONASE) 50 MCG/ACT nasal spray Place 2 sprays into the nose daily as needed.  16 g  11  . metFORMIN (GLUCOPHAGE) 500 MG tablet Take 1 tablet (500 mg total) by mouth 2 (two) times daily with a meal.  60 tablet  11  . metoprolol (LOPRESSOR) 50 MG tablet Take 1 tablet (50 mg total) by mouth 2 (two) times daily.  60 tablet  0  . potassium chloride SA (KLOR-CON M20) 20 MEQ tablet Take 1 tablet (20 mEq total) by mouth daily.  30 tablet  11  . rosuvastatin (CRESTOR) 20 MG tablet Take 1 tablet (20 mg total) by mouth daily.  30 tablet  11   No current facility-administered medications on file prior to  visit.      Review of Systems Review of Systems  Constitutional: Negative for fever, appetite change, fatigue and unexpected weight change.  Eyes: Negative for pain and visual disturbance.  Respiratory: Negative for cough and shortness of breath.   Cardiovascular: Negative for cp or palpitations   (now) , neg for PND or orthopnea or edema or sob Gastrointestinal: Negative for nausea, diarrhea and constipation.  Genitourinary: Negative for urgency and frequency.  Skin: Negative for pallor or rash   Neurological: Negative for weakness, light-headedness, numbness and headaches.  Hematological: Negative for adenopathy. Does not bruise/bleed easily.  Psychiatric/Behavioral: Negative for dysphoric mood. The patient is not nervous/anxious.         Objective:   Physical Exam  Constitutional: She appears well-developed and well-nourished. No distress.  HENT:  Head: Normocephalic and atraumatic.  Mouth/Throat: Oropharynx is clear and moist.  Eyes: Conjunctivae and EOM are normal. Pupils are equal, round, and reactive to light. Right eye exhibits no discharge. Left eye exhibits no discharge. No scleral icterus.  Neck: Normal range of motion. Neck supple. No JVD present. Carotid bruit is not present. No thyromegaly present.  Cardiovascular: Normal rate, regular rhythm, normal heart sounds and intact distal pulses.  Exam reveals no gallop.   No murmur heard. Pulmonary/Chest: Effort normal and breath sounds normal. No respiratory distress. She has no wheezes. She has no rales.  No crackles  Abdominal: Soft. Bowel sounds are normal. She exhibits no distension, no abdominal bruit and no mass. There is no tenderness.  Musculoskeletal: She exhibits no edema and no tenderness.  Lymphadenopathy:    She has no cervical adenopathy.  Neurological: She is alert. She has normal reflexes. She displays no tremor. No cranial nerve deficit. She exhibits normal muscle tone. Coordination normal.  Skin: Skin is  warm and dry. No rash noted. No erythema. No pallor.  Psychiatric: She has a normal mood and affect.          Assessment & Plan:

## 2013-04-21 NOTE — Telephone Encounter (Signed)
She went to ER- reviewed and I will be seeing her today

## 2013-04-30 ENCOUNTER — Ambulatory Visit (INDEPENDENT_AMBULATORY_CARE_PROVIDER_SITE_OTHER): Payer: Medicare Other | Admitting: Cardiovascular Disease

## 2013-04-30 ENCOUNTER — Encounter: Payer: Self-pay | Admitting: Cardiovascular Disease

## 2013-04-30 VITALS — BP 160/72 | HR 74 | Ht 61.0 in | Wt 146.5 lb

## 2013-04-30 DIAGNOSIS — R000 Tachycardia, unspecified: Secondary | ICD-10-CM

## 2013-04-30 DIAGNOSIS — R42 Dizziness and giddiness: Secondary | ICD-10-CM

## 2013-04-30 DIAGNOSIS — I10 Essential (primary) hypertension: Secondary | ICD-10-CM

## 2013-04-30 MED ORDER — HYDROCHLOROTHIAZIDE 25 MG PO TABS: 12.5000 mg | ORAL_TABLET | Freq: Every day | ORAL | Status: DC

## 2013-04-30 MED ORDER — LISINOPRIL 10 MG PO TABS: 10.0000 mg | ORAL_TABLET | Freq: Every day | ORAL | Status: DC

## 2013-04-30 NOTE — Progress Notes (Signed)
Kathy Cunningham Date of Birth  09-Oct-1935       [facility]    [place] [address]  [address], [address] [place], [place]  [address]   [place], [place]  [address] [phone]     [phone]   Fax  [phone]    Fax [phone]  Problem List: 1. palpitations 2. Diabetes mellitus 3. Hyperlipidemia 4. Hypertension   History of Present Illness:  Kathy Cunningham is a 77 yo who presents with palpitations.  She was seen in the [place].  She was found to have sinus tachycardia in the 130 range.  Her blood pressure was intermittently elevated but it was a fairly normal range once she saw the emergency room physician's assistant.  She was also found to have hypokalemia. She admits that she is to not take her potassium pill a regular basis but has been taking it regularly since that time.  She returned to the ER several days later with the same symptoms. She was eventually started on low-dose metoprolol. She seems to be feeling better on the metoprolol.  She still has occasional skipped heartbeats but the constant racing that she was having seems to be resolved.  She overall feels better. She still has symptoms of orthostatic hypotension on occasion.     Current Outpatient Prescriptions on File Prior to Visit  Medication Sig Dispense Refill  . bimatoprost (LUMIGAN) 0.03 % ophthalmic solution Place 1 drop into both eyes at bedtime.        . brimonidine (ALPHAGAN P) 0.1 % SOLN Place 1 drop into the left eye 2 (two) times daily.        . calcium citrate (CALCITRATE - DOSED IN MG ELEMENTAL CALCIUM) 950 MG tablet Take 1 tablet by mouth 2 (two) times daily.      . cholecalciferol (VITAMIN D) 1000 UNITS tablet Take 2,000 Units by mouth daily.       . enalapril-hydrochlorothiazide (VASERETIC) 10-25 MG per tablet Take 2 tablets by mouth daily. .  60 tablet  11  . estradiol (ESTRACE) 0.1 MG/GM vaginal cream Use small amount intravaginally twice weekly as  directed.  42.5 g  1  . fluticasone (FLONASE) 50 MCG/ACT nasal spray Place 2 sprays into the nose daily as needed.  16 g  11  . metFORMIN (GLUCOPHAGE) 500 MG tablet Take 1 tablet (500 mg total) by mouth 2 (two) times daily with a meal.  60 tablet  11  . metoprolol (LOPRESSOR) 50 MG tablet Take 1 tablet (50 mg total) by mouth 2 (two) times daily.  60 tablet  0  . potassium chloride SA (KLOR-CON M20) 20 MEQ tablet Take 1 tablet (20 mEq total) by mouth daily.  30 tablet  11  . rosuvastatin (CRESTOR) 20 MG tablet Take 1 tablet (20 mg total) by mouth daily.  30 tablet  11   No current facility-administered medications on file prior to visit.    Allergies  Allergen Reactions  . Atorvastatin     REACTION: cramps  . Risedronate Sodium     Vomiting   . Sulfonamide Derivatives     REACTION: rash    Past Medical History  Diagnosis Date  . Diabetes mellitus     type II  . Dizziness     or vertigo  . Hypertension   . Hyperlipidemia   . Anemia   . Glaucoma     Past Surgical History  Procedure Laterality Date  .  Rotator cuff repair  01/2002  . Bladder papilloma  2006  . Vaginal hysterectomy      History  Smoking status  . Never Smoker   Smokeless tobacco  . Not on file    History  Alcohol Use No    Family History  Problem Relation Age of Onset  . Heart disease Mother     MI  . Heart attack Mother   . Hypertension Mother   . Heart disease Father     MI  . Heart attack Father   . Hypertension Father     Reviw of Systems:  Reviewed in the HPI.  All other systems are negative.  Physical Exam: Blood pressure 160/72, pulse 74, height 5\' 1"  (1.549 m), weight 146 lb 8 oz (66.452 kg). General: Well developed, well nourished, in no acute distress.  Head: Normocephalic, atraumatic, sclera non-icteric, mucus membranes are moist,   Neck: Supple. Carotids are 2 + without bruits. No JVD   Lungs: Clear   Heart: RR, normal S1, S2  Abdomen: Soft, non-tender, non-distended  with normal bowel sounds.  Msk:  Strength and tone are normal   Extremities: No clubbing or cyanosis. No edema.  Distal pedal pulses are 2+ and equal    Neuro: CN II - XII intact.  Alert and oriented X 3.   Psych:  Normal   ECG: April 30, 2013:  NSR at 71. No ST or T wave changes.   Assessment / Plan:

## 2013-04-30 NOTE — Assessment & Plan Note (Signed)
Kathy Cunningham is seen today for various issues including orthostatic hypotension, sinus tachycardia, palpitations that are consistent with premature ventricular contractions. I suspect that she is somewhat volume depleted most of the time. This would explain her sinus tachycardia. She also was hypokalemic with a potassium level of 2.9. This would likely be the cause of her premature ventricular contractions.  She has been restarted on her daily potassium supplement.  It is, she was only taking it as needed. We'll discontinue her Vasotec and start her on a separate lisinopril prescription and HCTZ prescription. She is to take 12.5 mg IV HCTZ a day and 10 mg of lisinopril a day. We'll continue with metoprolol 50 mg twice a day.  I will see her  back in the office in approximately 6 weeks for followup visit

## 2013-04-30 NOTE — Patient Instructions (Addendum)
Your physician recommends that you schedule a follow-up appointment in: 6-8 weeks  Your physician recommends that you return for lab work in: BMET ON RETURN VISIT   Your physician has recommended you make the following change in your medication:  STOP VASERETIC START HCTZ 25 MG TABLET// TAKE 1/2 TABLET DAILY START LISINOPRIL 10 MG DAILY  REDUCE HIGH SODIUM FOODS LIKE CANNED SOUP, GRAVY, SAUCES, READY PREPARED FOODS LIKE FROZEN FOODS; LEAN CUISINE, LASAGNA. BACON, SAUSAGE, LUNCH MEAT, FAST FOODS, HOT DOGS, CHIPS, PIZZA.

## 2013-05-19 ENCOUNTER — Other Ambulatory Visit: Payer: Self-pay

## 2013-05-20 ENCOUNTER — Other Ambulatory Visit: Payer: Self-pay | Admitting: *Deleted

## 2013-05-20 MED ORDER — METOPROLOL TARTRATE 50 MG PO TABS: 50.0000 mg | ORAL_TABLET | Freq: Two times a day (BID) | ORAL | Status: DC

## 2013-05-20 MED ORDER — HYDROCHLOROTHIAZIDE 25 MG PO TABS: 12.5000 mg | ORAL_TABLET | Freq: Every day | ORAL | Status: DC

## 2013-05-20 NOTE — Telephone Encounter (Signed)
Refilled Metoprolol sent to [facility] pharmacy. 

## 2013-06-08 ENCOUNTER — Telehealth: Payer: Self-pay | Admitting: Family Medicine

## 2013-06-08 DIAGNOSIS — I10 Essential (primary) hypertension: Secondary | ICD-10-CM

## 2013-06-08 DIAGNOSIS — E119 Type 2 diabetes mellitus without complications: Secondary | ICD-10-CM

## 2013-06-08 NOTE — Telephone Encounter (Signed)
Message copied by Judy Pimple on Sun Jun 08, 2013  5:03 PM ------      Message from: Alvina Chou      Created: Fri May 30, 2013  4:16 PM      Regarding: Lab orders for, Monday, 10.6.14       Labs for a 2 month f/u ------

## 2013-06-09 ENCOUNTER — Other Ambulatory Visit (INDEPENDENT_AMBULATORY_CARE_PROVIDER_SITE_OTHER): Payer: Medicare Other

## 2013-06-09 DIAGNOSIS — I10 Essential (primary) hypertension: Secondary | ICD-10-CM

## 2013-06-09 DIAGNOSIS — E119 Type 2 diabetes mellitus without complications: Secondary | ICD-10-CM

## 2013-06-09 LAB — COMPREHENSIVE METABOLIC PANEL
AST: 20 U/L (ref 0–37)
Albumin: 4 g/dL (ref 3.5–5.2)
Alkaline Phosphatase: 41 U/L (ref 39–117)
Potassium: 3.9 mEq/L (ref 3.5–5.1)
Sodium: 138 mEq/L (ref 135–145)
Total Bilirubin: 0.7 mg/dL (ref 0.3–1.2)
Total Protein: 7.1 g/dL (ref 6.0–8.3)

## 2013-06-13 ENCOUNTER — Encounter: Payer: Self-pay | Admitting: Family Medicine

## 2013-06-13 ENCOUNTER — Ambulatory Visit (INDEPENDENT_AMBULATORY_CARE_PROVIDER_SITE_OTHER): Payer: Medicare Other | Admitting: Family Medicine

## 2013-06-13 VITALS — BP 136/78 | HR 63 | Temp 97.8°F | Ht 61.0 in | Wt 144.2 lb

## 2013-06-13 DIAGNOSIS — E119 Type 2 diabetes mellitus without complications: Secondary | ICD-10-CM

## 2013-06-13 DIAGNOSIS — I10 Essential (primary) hypertension: Secondary | ICD-10-CM

## 2013-06-13 DIAGNOSIS — L708 Other acne: Secondary | ICD-10-CM

## 2013-06-13 DIAGNOSIS — Z23 Encounter for immunization: Secondary | ICD-10-CM

## 2013-06-13 DIAGNOSIS — L700 Acne vulgaris: Secondary | ICD-10-CM | POA: Insufficient documentation

## 2013-06-13 NOTE — Patient Instructions (Addendum)
Labs are stable Keep up good exercise and diet Flu and pneumonia vaccines today  Keep the area on your back clean  Follow up in 6 months with labs prior for annual exam

## 2013-06-13 NOTE — Progress Notes (Signed)
Subjective:    Patient ID: Kathy Cunningham, female    DOB: Jun 28, 1936, 77 y.o.   MRN: [id number]  HPI Here for f/u of DM and HTN   Wt is down 2 lb with bmi of 27 Vitals stable  Pulse stable-- still doing well with that   bp is stable today  No cp or palpitations or headaches or edema  No side effects to medicines  BP Readings from Last 3 Encounters:  06/13/13 136/78  04/30/13 160/72  04/21/13 130/65     Diabetes Home sugar results - daughter checks her sugars (her monitor stopped working)  DM diet -- sticks with DM diet for the most part  Exercise - is working outdoors and working in garden -- she walks also Symptoms A1C last  Lab Results  Component Value Date   HGBA1C 6.4 06/09/2013  last one was 6.3- so overall stable  No problems with medications  Renal protection- ace  Wants her flu shot today  Last eye exam 4/14- has another one on Monday    Patient Active Problem List   Diagnosis Date Noted  . Tachycardia 04/21/2013  . Other screening mammogram 04/24/2011  . Glaucoma 04/24/2011  . Vitamin D deficiency 04/24/2011  . ANXIETY, SITUATIONAL 10/20/2010  . HYPOKALEMIA 10/22/2009  . OTHER THALASSEMIA 06/10/2008  . UNSPECIFIED OSTEOPOROSIS 05/01/2008  . DIABETES MELLITUS, TYPE II 07/17/2007  . HYPERCHOLESTEROLEMIA 07/17/2007  . ANEMIA, CHRONIC 07/17/2007  . HYPERTENSION 07/17/2007  . PREMATURE VENTRICULAR CONTRACTIONS, FREQUENT 07/17/2007  . UTI'S, RECURRENT 07/17/2007   Past Medical History  Diagnosis Date  . Diabetes mellitus     type II  . Dizziness     or vertigo  . Hypertension   . Hyperlipidemia   . Anemia   . Glaucoma    Past Surgical History  Procedure Laterality Date  . Rotator cuff repair  01/2002  . Bladder papilloma  2006  . Vaginal hysterectomy     History  Substance Use Topics  . Smoking status: Never Smoker   . Smokeless tobacco: Not on file  . Alcohol Use: No   Family History  Problem Relation Age of Onset  . Heart disease  Mother     MI  . Heart attack Mother   . Hypertension Mother   . Heart disease Father     MI  . Heart attack Father   . Hypertension Father    Allergies  Allergen Reactions  . Atorvastatin     REACTION: cramps  . Risedronate Sodium     Vomiting   . Sulfonamide Derivatives     REACTION: rash   Current Outpatient Prescriptions on File Prior to Visit  Medication Sig Dispense Refill  . bimatoprost (LUMIGAN) 0.03 % ophthalmic solution Place 1 drop into both eyes at bedtime.        . brimonidine (ALPHAGAN P) 0.1 % SOLN Place 1 drop into the left eye 2 (two) times daily.        . calcium citrate (CALCITRATE - DOSED IN MG ELEMENTAL CALCIUM) 950 MG tablet Take 1 tablet by mouth 2 (two) times daily.      . cholecalciferol (VITAMIN D) 1000 UNITS tablet Take 2,000 Units by mouth daily.       Marland Kitchen estradiol (ESTRACE) 0.1 MG/GM vaginal cream Use small amount intravaginally twice weekly as directed.  42.5 g  1  . fluticasone (FLONASE) 50 MCG/ACT nasal spray Place 2 sprays into the nose daily as needed.  16 g  11  .  hydrochlorothiazide (HYDRODIURIL) 25 MG tablet Take 0.5 tablets (12.5 mg total) by mouth daily.  45 tablet  3  . lisinopril (PRINIVIL,ZESTRIL) 10 MG tablet Take 1 tablet (10 mg total) by mouth daily.  90 tablet  3  . metFORMIN (GLUCOPHAGE) 500 MG tablet Take 1 tablet (500 mg total) by mouth 2 (two) times daily with a meal.  60 tablet  11  . metoprolol (LOPRESSOR) 50 MG tablet Take 1 tablet (50 mg total) by mouth 2 (two) times daily.  60 tablet  3  . potassium chloride SA (KLOR-CON M20) 20 MEQ tablet Take 1 tablet (20 mEq total) by mouth daily.  30 tablet  11  . rosuvastatin (CRESTOR) 20 MG tablet Take 1 tablet (20 mg total) by mouth daily.  30 tablet  11   No current facility-administered medications on file prior to visit.     Review of Systems Review of Systems  Constitutional: Negative for fever, appetite change, fatigue and unexpected weight change.  Eyes: Negative for pain and  visual disturbance.  Respiratory: Negative for cough and shortness of breath.   Cardiovascular: Negative for cp or palpitations    Gastrointestinal: Negative for nausea, diarrhea and constipation.  Genitourinary: Negative for urgency and frequency.  Skin: Negative for pallor or rash  pos for a pimple/ cyst on back Neurological: Negative for weakness, light-headedness, numbness and headaches.  Hematological: Negative for adenopathy. Does not bruise/bleed easily.  Psychiatric/Behavioral: Negative for dysphoric mood. The patient is not nervous/anxious.         Objective:   Physical Exam  Constitutional: She appears well-developed and well-nourished. No distress.  HENT:  Head: Normocephalic and atraumatic.  Mouth/Throat: Oropharynx is clear and moist.  Eyes: Conjunctivae and EOM are normal. Pupils are equal, round, and reactive to light. No scleral icterus.  Neck: Normal range of motion. Neck supple. No JVD present. Carotid bruit is not present. No thyromegaly present.  Cardiovascular: Normal rate, regular rhythm, normal heart sounds and intact distal pulses.  Exam reveals no gallop.   Pulmonary/Chest: Effort normal and breath sounds normal. No respiratory distress. She has no wheezes. She has no rales.  Abdominal: Soft. Bowel sounds are normal. She exhibits no distension, no abdominal bruit and no mass.  Musculoskeletal: She exhibits no edema.  Lymphadenopathy:    She has no cervical adenopathy.  Neurological: She is alert. She has normal reflexes.  Skin: Skin is warm and dry. No rash noted. No erythema. No pallor.  Large comedone expressed caseous material L back - dressed with band aid  Psychiatric: She has a normal mood and affect.          Assessment & Plan:

## 2013-06-15 NOTE — Assessment & Plan Note (Signed)
bp in fair control at this time  No changes needed  Disc lifstyle change with low sodium diet and exercise   Lab reviewed

## 2013-06-15 NOTE — Assessment & Plan Note (Signed)
Stable A1C Rev low glycemic diet  Px for new DM supplies

## 2013-06-15 NOTE — Assessment & Plan Note (Signed)
Expressed and cleaned / dressed Update if not starting to improve in a week or if worsening   This is recurrent

## 2013-06-24 ENCOUNTER — Encounter: Payer: Self-pay | Admitting: Cardiovascular Disease

## 2013-06-24 ENCOUNTER — Ambulatory Visit (INDEPENDENT_AMBULATORY_CARE_PROVIDER_SITE_OTHER): Payer: Medicare Other | Admitting: Cardiovascular Disease

## 2013-06-24 ENCOUNTER — Ambulatory Visit (INDEPENDENT_AMBULATORY_CARE_PROVIDER_SITE_OTHER): Payer: Medicare Other

## 2013-06-24 VITALS — BP 138/72 | HR 68 | Ht 62.0 in | Wt 145.0 lb

## 2013-06-24 DIAGNOSIS — I10 Essential (primary) hypertension: Secondary | ICD-10-CM

## 2013-06-24 DIAGNOSIS — R42 Dizziness and giddiness: Secondary | ICD-10-CM

## 2013-06-24 DIAGNOSIS — R002 Palpitations: Secondary | ICD-10-CM

## 2013-06-24 DIAGNOSIS — R000 Tachycardia, unspecified: Secondary | ICD-10-CM

## 2013-06-24 NOTE — Patient Instructions (Signed)
Your physician recommends that you have lab work today: bmp  Your physician wants you to follow-up in: 6 months with Dr. Elease Hashimoto. You will receive a reminder letter in the mail two months in advance. If you don't receive a letter, please call our office to schedule the follow-up appointment.

## 2013-06-24 NOTE — Progress Notes (Signed)
Kathy Cunningham Date of Birth  Mar 22, 1936       [facility]    [place] [address]  [address] [place], [place]  [address]   [place], [place]  [address] [phone]     [phone]   Fax  [phone]    Fax [phone]  Problem List: 1. palpitations 2. Diabetes mellitus 3. Hyperlipidemia 4. Hypertension   History of Present Illness:  Kathy Cunningham is a 77 yo who presents with palpitations.  She was seen in the [place].  She was found to have sinus tachycardia in the 130 range.  Her blood pressure was intermittently elevated but it was a fairly normal range once she saw the emergency room physician's assistant.  She was also found to have hypokalemia. She admits that she is to not take her potassium pill a regular basis but has been taking it regularly since that time.  She returned to the ER several days later with the same symptoms. She was eventually started on low-dose metoprolol. She seems to be feeling better on the metoprolol.  She still has occasional skipped heartbeats but the constant racing that she was having seems to be resolved.  She overall feels better. She still has symptoms of orthostatic hypotension on occasion.    Oct. 21, 2014:  Kathy Cunningham is doing very well.  She has some palpitations - always resolve very quickly.   She had some problems with hypokalemia.  She is taking her kdur regularly.  Exercising on occasion    Current Outpatient Prescriptions on File Prior to Visit  Medication Sig Dispense Refill  . bimatoprost (LUMIGAN) 0.03 % ophthalmic solution Place 1 drop into both eyes at bedtime.        . brimonidine (ALPHAGAN P) 0.1 % SOLN Place 1 drop into the left eye 2 (two) times daily.        . calcium citrate (CALCITRATE - DOSED IN MG ELEMENTAL CALCIUM) 950 MG tablet Take 1 tablet by mouth 2 (two) times daily.      . cholecalciferol (VITAMIN D) 1000 UNITS tablet Take 2,000 Units by mouth daily.       Marland Kitchen  estradiol (ESTRACE) 0.1 MG/GM vaginal cream Use small amount intravaginally twice weekly as directed.  42.5 g  1  . fluticasone (FLONASE) 50 MCG/ACT nasal spray Place 2 sprays into the nose daily as needed.  16 g  11  . hydrochlorothiazide (HYDRODIURIL) 25 MG tablet Take 0.5 tablets (12.5 mg total) by mouth daily.  45 tablet  3  . lisinopril (PRINIVIL,ZESTRIL) 10 MG tablet Take 1 tablet (10 mg total) by mouth daily.  90 tablet  3  . metFORMIN (GLUCOPHAGE) 500 MG tablet Take 1 tablet (500 mg total) by mouth 2 (two) times daily with a meal.  60 tablet  11  . metoprolol (LOPRESSOR) 50 MG tablet Take 1 tablet (50 mg total) by mouth 2 (two) times daily.  60 tablet  3  . potassium chloride SA (KLOR-CON M20) 20 MEQ tablet Take 1 tablet (20 mEq total) by mouth daily.  30 tablet  11  . rosuvastatin (CRESTOR) 20 MG tablet Take 1 tablet (20 mg total) by mouth daily.  30 tablet  11   No current facility-administered medications on file prior to visit.    Allergies  Allergen Reactions  . Atorvastatin     REACTION: cramps  . Risedronate Sodium     Vomiting   . Sulfonamide  Derivatives     REACTION: rash    Past Medical History  Diagnosis Date  . Diabetes mellitus     type II  . Dizziness     or vertigo  . Hypertension   . Hyperlipidemia   . Anemia   . Glaucoma     Past Surgical History  Procedure Laterality Date  . Rotator cuff repair  01/2002  . Bladder papilloma  2006  . Vaginal hysterectomy      History  Smoking status  . Never Smoker   Smokeless tobacco  . Not on file    History  Alcohol Use No    Family History  Problem Relation Age of Onset  . Heart disease Mother     MI  . Heart attack Mother   . Hypertension Mother   . Heart disease Father     MI  . Heart attack Father   . Hypertension Father     Reviw of Systems:  Reviewed in the HPI.  All other systems are negative.  Physical Exam: Blood pressure 138/72, pulse 68, height 5\' 2"  (1.575 m), weight 145 lb  (65.772 kg). General: Well developed, well nourished, in no acute distress.  Head: Normocephalic, atraumatic, sclera non-icteric, mucus membranes are moist,   Neck: Supple. Carotids are 2 + without bruits. No JVD   Lungs: Clear   Heart: RR, normal S1, S2  Abdomen: Soft, non-tender, non-distended with normal bowel sounds.  Msk:  Strength and tone are normal   Extremities: No clubbing or cyanosis. No edema.  Distal pedal pulses are 2+ and equal    Neuro: CN II - XII intact.  Alert and oriented X 3.   Psych:  Normal   ECG: Oct. 21, 2014:  Sinus rhythm at 68.  Normal ECG.  Assessment / Plan:

## 2013-06-24 NOTE — Assessment & Plan Note (Signed)
Her BP is well controlled.  She has not had any further palpitations and is feeling quite well.  Will continue her current meds.   Will check BMP today and will see her again in 6 months for OV and bmp

## 2013-06-25 LAB — BASIC METABOLIC PANEL
BUN/Creatinine Ratio: 17 (ref 11–26)
BUN: 14 mg/dL (ref 8–27)
CO2: 27 mmol/L (ref 18–29)
Calcium: 9.2 mg/dL (ref 8.6–10.2)
Chloride: 103 mmol/L (ref 97–108)
GFR calc Af Amer: 79 mL/min/{1.73_m2} (ref >59)
Glucose: 112 mg/dL — ABNORMAL HIGH (ref 65–99)

## 2013-09-24 ENCOUNTER — Other Ambulatory Visit: Payer: Self-pay | Admitting: Cardiovascular Disease

## 2013-09-24 ENCOUNTER — Other Ambulatory Visit: Payer: Self-pay | Admitting: *Deleted

## 2013-09-24 MED ORDER — METOPROLOL TARTRATE 50 MG PO TABS: 50.0000 mg | ORAL_TABLET | Freq: Two times a day (BID) | ORAL | Status: DC

## 2013-09-24 NOTE — Telephone Encounter (Signed)
Requested Prescriptions   Signed Prescriptions Disp Refills  . metoprolol (LOPRESSOR) 50 MG tablet 60 tablet 3    Sig: Take 1 tablet (50 mg total) by mouth 2 (two) times daily.    Authorizing Provider: Thayer Headings    Ordering User: Britt Bottom

## 2013-11-20 ENCOUNTER — Other Ambulatory Visit: Payer: Self-pay | Admitting: Family Medicine

## 2013-11-20 DIAGNOSIS — Z1231 Encounter for screening mammogram for malignant neoplasm of breast: Secondary | ICD-10-CM

## 2013-11-28 ENCOUNTER — Ambulatory Visit (HOSPITAL_COMMUNITY)
Admission: RE | Admit: 2013-11-28 | Discharge: 2013-11-28 | Disposition: A | Discharge status: Home / Self Care | Payer: Medicare Other | Source: Ambulatory Visit | Attending: Family Medicine | Admitting: Family Medicine

## 2013-11-28 DIAGNOSIS — Z1231 Encounter for screening mammogram for malignant neoplasm of breast: Secondary | ICD-10-CM

## 2013-12-03 ENCOUNTER — Other Ambulatory Visit: Payer: Self-pay | Admitting: Family Medicine

## 2013-12-03 DIAGNOSIS — R928 Other abnormal and inconclusive findings on diagnostic imaging of breast: Secondary | ICD-10-CM

## 2013-12-15 LAB — HM DIABETES EYE EXAM

## 2013-12-16 ENCOUNTER — Ambulatory Visit
Admission: RE | Admit: 2013-12-16 | Discharge: 2013-12-16 | Disposition: A | Discharge status: Home / Self Care | Payer: Medicare Other | Source: Ambulatory Visit | Attending: Family Medicine | Admitting: Family Medicine

## 2013-12-16 ENCOUNTER — Other Ambulatory Visit: Payer: Self-pay | Admitting: Family Medicine

## 2013-12-16 DIAGNOSIS — R928 Other abnormal and inconclusive findings on diagnostic imaging of breast: Secondary | ICD-10-CM

## 2013-12-16 NOTE — Telephone Encounter (Signed)
Will hold until appt tomorrow with Dr. Glori Bickers

## 2013-12-17 ENCOUNTER — Ambulatory Visit (INDEPENDENT_AMBULATORY_CARE_PROVIDER_SITE_OTHER): Payer: Medicare Other | Admitting: Family Medicine

## 2013-12-17 ENCOUNTER — Encounter: Payer: Self-pay | Admitting: Family Medicine

## 2013-12-17 VITALS — BP 130/75 | HR 73 | Temp 97.9°F | Ht 62.0 in | Wt 140.8 lb

## 2013-12-17 DIAGNOSIS — E119 Type 2 diabetes mellitus without complications: Secondary | ICD-10-CM

## 2013-12-17 DIAGNOSIS — I10 Essential (primary) hypertension: Secondary | ICD-10-CM

## 2013-12-17 DIAGNOSIS — Z1239 Encounter for other screening for malignant neoplasm of breast: Secondary | ICD-10-CM | POA: Insufficient documentation

## 2013-12-17 LAB — HEMOGLOBIN A1C: Hgb A1c MFr Bld: 6.3 % (ref 4.6–6.5)

## 2013-12-17 NOTE — Telephone Encounter (Signed)
Rx refilled.

## 2013-12-17 NOTE — Patient Instructions (Signed)
Labs today I'm glad your mammogram looked ok  Continue self breast exams  You can check sugar 2-3 times per week (or more if you want to)- if diabetes remains well controlled  Follow up in 6 months for annual exam with labs prior

## 2013-12-17 NOTE — Progress Notes (Signed)
Subjective:    Patient ID: Kathy Cunningham, female    DOB: 06/29/1936, 78 y.o.   MRN: [id number]  HPI Here for f/u of chronic health problems   Had mammogram and she had to have repeat views - made her nervous - but her result was ok    bp is up today - she thinks she was nervous about her mammogram  She ate very salty cheetos yesterday  No cp or palpitations or headaches or edema  No side effects to medicines  BP Readings from Last 3 Encounters:  12/17/13 150/80  06/24/13 138/72  06/13/13 136/78    Re check bp was much improved  BP: 130/75 mmHg    Diabetes Home sugar results - very good - 120s in am  DM diet - overall good  Exercise - getting regularly - walking and gardening  Symptoms A1C last  Lab Results  Component Value Date   HGBA1C 6.4 06/09/2013    No problems with medications  Renal protection- ace  Last eye exam - was on Monday     Patient Active Problem List   Diagnosis Date Noted  . [place] 06/13/2013  . Tachycardia 04/21/2013  . Other screening mammogram 04/24/2011  . Glaucoma 04/24/2011  . Vitamin D deficiency 04/24/2011  . ANXIETY, SITUATIONAL 10/20/2010  . HYPOKALEMIA 10/22/2009  . OTHER THALASSEMIA 06/10/2008  . UNSPECIFIED OSTEOPOROSIS 05/01/2008  . DIABETES MELLITUS, TYPE II 07/17/2007  . HYPERCHOLESTEROLEMIA 07/17/2007  . ANEMIA, CHRONIC 07/17/2007  . HYPERTENSION 07/17/2007  . PREMATURE VENTRICULAR CONTRACTIONS, FREQUENT 07/17/2007  . UTI'S, RECURRENT 07/17/2007   Past Medical History  Diagnosis Date  . Diabetes mellitus     type II  . Dizziness     or vertigo  . Hypertension   . Hyperlipidemia   . Anemia   . Glaucoma    Past Surgical History  Procedure Laterality Date  . Rotator cuff repair  01/2002  . Bladder papilloma  2006  . Vaginal hysterectomy     History  Substance Use Topics  . Smoking status: Never Smoker   . Smokeless tobacco: Not on file  . Alcohol Use: No   Family History  Problem Relation Age of Onset    . Heart disease Mother     MI  . Heart attack Mother   . Hypertension Mother   . Heart disease Father     MI  . Heart attack Father   . Hypertension Father    Allergies  Allergen Reactions  . Atorvastatin     REACTION: cramps  . Risedronate Sodium     Vomiting   . Sulfonamide Derivatives     REACTION: rash   Current Outpatient Prescriptions on File Prior to Visit  Medication Sig Dispense Refill  . bimatoprost (LUMIGAN) 0.03 % ophthalmic solution Place 1 drop into both eyes at bedtime.        . brimonidine (ALPHAGAN P) 0.1 % SOLN Place 1 drop into the left eye 2 (two) times daily.        . calcium citrate (CALCITRATE - DOSED IN MG ELEMENTAL CALCIUM) 950 MG tablet Take 1 tablet by mouth 2 (two) times daily.      . cholecalciferol (VITAMIN D) 1000 UNITS tablet Take 2,000 Units by mouth daily.       Marland Kitchen estradiol (ESTRACE) 0.1 MG/GM vaginal cream Use small amount intravaginally twice weekly as directed.  42.5 g  1  . fluticasone (FLONASE) 50 MCG/ACT nasal spray Place 2 sprays into the nose  daily as needed.  16 g  11  . hydrochlorothiazide (HYDRODIURIL) 25 MG tablet Take 0.5 tablets (12.5 mg total) by mouth daily.  45 tablet  3  . lisinopril (PRINIVIL,ZESTRIL) 10 MG tablet Take 1 tablet (10 mg total) by mouth daily.  90 tablet  3  . metFORMIN (GLUCOPHAGE) 500 MG tablet Take 1 tablet (500 mg total) by mouth 2 (two) times daily with a meal.  60 tablet  11  . metoprolol (LOPRESSOR) 50 MG tablet Take 1 tablet (50 mg total) by mouth 2 (two) times daily.  60 tablet  3  . potassium chloride SA (KLOR-CON M20) 20 MEQ tablet Take 1 tablet (20 mEq total) by mouth daily.  30 tablet  11  . rosuvastatin (CRESTOR) 20 MG tablet Take 1 tablet (20 mg total) by mouth daily.  30 tablet  11   No current facility-administered medications on file prior to visit.    Review of Systems Review of Systems  Constitutional: Negative for fever, appetite change, fatigue and unexpected weight change.  Eyes:  Negative for pain and visual disturbance.  Respiratory: Negative for cough and shortness of breath.   Cardiovascular: Negative for cp or palpitations    Gastrointestinal: Negative for nausea, diarrhea and constipation.  Genitourinary: Negative for urgency and frequency.  Skin: Negative for pallor or rash   Neurological: Negative for weakness, light-headedness, numbness and headaches.  Hematological: Negative for adenopathy. Does not bruise/bleed easily.  Psychiatric/Behavioral: Negative for dysphoric mood. The patient is  nervous/anxious.         Objective:   Physical Exam  Constitutional: She appears well-developed and well-nourished. No distress.  HENT:  Head: Normocephalic and atraumatic.  Mouth/Throat: Oropharynx is clear and moist.  Eyes: Conjunctivae and EOM are normal. Pupils are equal, round, and reactive to light. No scleral icterus.  Neck: Normal range of motion. Neck supple. No JVD present. Carotid bruit is not present. No thyromegaly present.  Cardiovascular: Normal rate, regular rhythm and intact distal pulses.  Exam reveals no gallop.   Pulmonary/Chest: Effort normal and breath sounds normal. No respiratory distress. She has no wheezes. She has no rales.  Abdominal: Soft. Bowel sounds are normal. She exhibits no distension. There is no tenderness.  Musculoskeletal: She exhibits no edema.  Lymphadenopathy:    She has no cervical adenopathy.  Neurological: She is alert. She has normal reflexes. No cranial nerve deficit. She exhibits normal muscle tone. Coordination normal.  Skin: Skin is warm and dry. No rash noted. No pallor.  Psychiatric: Her mood appears anxious.  Anxious when talking about mammogram reports           Assessment & Plan:

## 2013-12-17 NOTE — Progress Notes (Signed)
Pre visit review using our clinic review tool, if applicable. No additional management support is needed unless otherwise documented below in the visit note. 

## 2013-12-18 ENCOUNTER — Telehealth: Payer: Self-pay | Admitting: Family Medicine

## 2013-12-18 NOTE — Assessment & Plan Note (Signed)
bp better on 2nd check-pt is anxious bp in fair control at this time  BP Readings from Last 1 Encounters:  12/17/13 130/75   No changes needed Disc lifstyle change with low sodium diet and exercise

## 2013-12-18 NOTE — Assessment & Plan Note (Signed)
Lab Results  Component Value Date   HGBA1C 6.3 12/17/2013   overall stable Disc low glycemic diet and exercise

## 2013-12-18 NOTE — Assessment & Plan Note (Signed)
Rev last mammo report-addn views were neg after ? Of a mass Pt feels better now-was quite anxious Disc dense breasts and consideration of 3D mammogram

## 2013-12-18 NOTE — Telephone Encounter (Signed)
Relevant patient education mailed to patient.  

## 2013-12-19 ENCOUNTER — Encounter: Payer: Self-pay | Admitting: *Deleted

## 2013-12-30 ENCOUNTER — Ambulatory Visit (INDEPENDENT_AMBULATORY_CARE_PROVIDER_SITE_OTHER): Payer: Medicare Other | Admitting: Cardiovascular Disease

## 2013-12-30 ENCOUNTER — Ambulatory Visit: Payer: Medicare Other | Admitting: Cardiovascular Disease

## 2013-12-30 ENCOUNTER — Encounter: Payer: Self-pay | Admitting: Cardiovascular Disease

## 2013-12-30 VITALS — BP 158/74 | HR 72 | Ht 61.0 in | Wt 140.5 lb

## 2013-12-30 DIAGNOSIS — R000 Tachycardia, unspecified: Secondary | ICD-10-CM

## 2013-12-30 DIAGNOSIS — I10 Essential (primary) hypertension: Secondary | ICD-10-CM

## 2013-12-30 NOTE — Patient Instructions (Signed)
Follow up as needed with Dr. Acie Fredrickson.

## 2013-12-30 NOTE — Progress Notes (Signed)
Kathy Cunningham Date of Birth  [birth year]       [facility]    [employer] [address], [place], [place]  [address]   [place], [place]  [address] [phone]     [phone]   Fax  [phone]    Fax [phone]  Problem List: 1. palpitations 2. Diabetes mellitus 3. Hyperlipidemia 4. Hypertension   History of Present Illness:  Kathy Cunningham is a 78 yo who presents with palpitations.  She was seen in the [facility].  She was found to have sinus tachycardia in the 130 range.  Her blood pressure was intermittently elevated but it was a fairly normal range once she saw the emergency room physician's assistant.  She was also found to have hypokalemia. She admits that she is to not take her potassium pill a regular basis but has been taking it regularly since that time.  She returned to the ER several days later with the same symptoms. She was eventually started on low-dose metoprolol. She seems to be feeling better on the metoprolol.  She still has occasional skipped heartbeats but the constant racing that she was having seems to be resolved.  She overall feels better. She still has symptoms of orthostatic hypotension on occasion.    Oct. 21, 2014:  Kathy. Kathy Cunningham is doing very well.  She has some palpitations - always resolve very quickly.   She had some problems with hypokalemia.  She is taking her kdur regularly.  Exercising on occasion  December 30, 2013:  Kathy Cunningham is doing well.    Her BP has been well controlled.     She has not had any serious palpitations.    Current Outpatient Prescriptions on File Prior to Visit  Medication Sig Dispense Refill  . bimatoprost (LUMIGAN) 0.03 % ophthalmic solution Place 1 drop into both eyes at bedtime.        . brimonidine (ALPHAGAN P) 0.1 % SOLN Place 1 drop into the left eye 2 (two) times daily.        . calcium citrate (CALCITRATE - DOSED IN MG ELEMENTAL CALCIUM) 950 MG tablet Take 1 tablet by  mouth 2 (two) times daily.      . cholecalciferol (VITAMIN D) 1000 UNITS tablet Take 2,000 Units by mouth daily.       Marland Kitchen estradiol (ESTRACE) 0.1 MG/GM vaginal cream Use small amount intravaginally twice weekly as directed.  42.5 g  1  . fluticasone (FLONASE) 50 MCG/ACT nasal spray Place 2 sprays into the nose daily as needed.  16 g  11  . hydrochlorothiazide (HYDRODIURIL) 25 MG tablet Take 0.5 tablets (12.5 mg total) by mouth daily.  45 tablet  3  . lisinopril (PRINIVIL,ZESTRIL) 10 MG tablet Take 1 tablet (10 mg total) by mouth daily.  90 tablet  3  . metFORMIN (GLUCOPHAGE) 500 MG tablet TAKE 1 TABLET TWICE A DAY WITH A MEAL  60 tablet  11  . metoprolol (LOPRESSOR) 50 MG tablet Take 1 tablet (50 mg total) by mouth 2 (two) times daily.  60 tablet  3  . potassium chloride SA (KLOR-CON M20) 20 MEQ tablet Take 1 tablet (20 mEq total) by mouth daily.  30 tablet  11  . rosuvastatin (CRESTOR) 20 MG tablet Take 1 tablet (20 mg total) by mouth daily.  30 tablet  11   No current facility-administered medications on file prior to visit.    Allergies  Allergen  Reactions  . Atorvastatin     REACTION: cramps  . Risedronate Sodium     Vomiting   . Sulfonamide Derivatives     REACTION: rash    Past Medical History  Diagnosis Date  . Diabetes mellitus     type II  . Dizziness     or vertigo  . Hypertension   . Hyperlipidemia   . Anemia   . Glaucoma     Past Surgical History  Procedure Laterality Date  . Rotator cuff repair  01/2002  . Bladder papilloma  2006  . Vaginal hysterectomy      History  Smoking status  . Never Smoker   Smokeless tobacco  . Not on file    History  Alcohol Use No    Family History  Problem Relation Age of Onset  . Heart disease Mother     MI  . Heart attack Mother   . Hypertension Mother   . Heart disease Father     MI  . Heart attack Father   . Hypertension Father     Reviw of Systems:  Reviewed in the HPI.  All other systems are  negative.  Physical Exam: Blood pressure 158/74, pulse 72, height 5\' 1"  (1.549 m), weight 140 lb 8 oz (63.73 kg). General: Well developed, well nourished, in no acute distress.  Head: Normocephalic, atraumatic, sclera non-icteric, mucus membranes are moist,   Neck: Supple. Carotids are 2 + without bruits. No JVD   Lungs: Clear   Heart: RR, normal S1, S2  Abdomen: Soft, non-tender, non-distended with normal bowel sounds.  Msk:  Strength and tone are normal   Extremities: No clubbing or cyanosis. No edema.  Distal pedal pulses are 2+ and equal    Neuro: CN II - XII intact.  Alert and oriented X 3.   Psych:  Normal   ECG: 12/30/2013: Normal sinus rhythm at 72. She has sinus arrhythmia. EKG is normal.  Assessment / Plan:

## 2013-12-30 NOTE — Assessment & Plan Note (Signed)
Her blood pressure is fairly well-controlled. It is a little elevated today because she just now took her medications. We'll continue with her same medications. At this point we'll turn her back over to her general medical Dr.   Everette Rank be happy to see her on an as-needed basis.

## 2013-12-31 ENCOUNTER — Telehealth: Payer: Self-pay

## 2013-12-31 NOTE — Telephone Encounter (Signed)
Relevant patient education mailed to patient.  

## 2014-01-21 ENCOUNTER — Other Ambulatory Visit: Payer: Self-pay | Admitting: Cardiovascular Disease

## 2014-03-02 ENCOUNTER — Other Ambulatory Visit: Payer: Self-pay | Admitting: Family Medicine

## 2014-03-02 ENCOUNTER — Other Ambulatory Visit: Payer: Self-pay | Admitting: Cardiovascular Disease

## 2014-04-06 ENCOUNTER — Other Ambulatory Visit: Payer: Self-pay | Admitting: Cardiovascular Disease

## 2014-04-15 ENCOUNTER — Other Ambulatory Visit: Payer: Self-pay | Admitting: Family Medicine

## 2014-04-21 ENCOUNTER — Other Ambulatory Visit: Payer: Self-pay | Admitting: Family Medicine

## 2014-04-23 ENCOUNTER — Other Ambulatory Visit: Payer: Self-pay | Admitting: Family Medicine

## 2014-06-15 LAB — HM DIABETES EYE EXAM

## 2014-07-02 ENCOUNTER — Other Ambulatory Visit: Payer: Self-pay | Admitting: Cardiovascular Disease

## 2014-09-11 ENCOUNTER — Encounter: Payer: Self-pay | Admitting: Family Medicine

## 2014-09-11 ENCOUNTER — Ambulatory Visit (INDEPENDENT_AMBULATORY_CARE_PROVIDER_SITE_OTHER): Payer: Medicare Other | Admitting: Family Medicine

## 2014-09-11 VITALS — BP 160/90 | HR 71 | Temp 98.1°F | Ht 61.0 in | Wt 141.5 lb

## 2014-09-11 DIAGNOSIS — Z23 Encounter for immunization: Secondary | ICD-10-CM | POA: Diagnosis not present

## 2014-09-11 DIAGNOSIS — E119 Type 2 diabetes mellitus without complications: Secondary | ICD-10-CM

## 2014-09-11 DIAGNOSIS — I10 Essential (primary) hypertension: Secondary | ICD-10-CM

## 2014-09-11 LAB — HEMOGLOBIN A1C: HEMOGLOBIN A1C: 6.5 % (ref 4.6–6.5)

## 2014-09-11 MED ORDER — LISINOPRIL 20 MG PO TABS: 20.0000 mg | ORAL_TABLET | Freq: Every day | ORAL | Status: DC

## 2014-09-11 NOTE — Patient Instructions (Signed)
Increase your lisinopril to 2 pills once daily (20 mg once daily)  If any problems let me know Continue your other medicines without change Watch out for sodium in diet -this affects blood pressure   Flu shot and prevnar shot today   Follow up in 1 month

## 2014-09-11 NOTE — Progress Notes (Signed)
Pre visit review using our clinic review tool, if applicable. No additional management support is needed unless otherwise documented below in the visit note. 

## 2014-09-11 NOTE — Progress Notes (Signed)
Subjective:    Patient ID: Kathy Cunningham, female    DOB: 15-Apr-1936, 79 y.o.   MRN: [id number]  HPI Here for f/u of chronic conditions   Last few bp are high  BP Readings from Last 3 Encounters:  09/11/14 160/90  12/30/13 158/74  12/17/13 130/75    Missed meds one day last week  Takes lisinopril and metoprolol and hctz   She is more active in the spring and the summer  Wt is up 1 lb with bmi of 26- with holidays  DM due for a check  Lab Results  Component Value Date   HGBA1C 6.3 12/17/2013   due for that today  On metformin  Glucose levels are generally in low 100s   Diet is pretty good   Needs flu shot  Also prevnar   Patient Active Problem List   Diagnosis Date Noted  . Breast cancer screening 12/17/2013  . Comedone 06/13/2013  . Tachycardia 04/21/2013  . Other screening mammogram 04/24/2011  . Glaucoma 04/24/2011  . Vitamin D deficiency 04/24/2011  . ANXIETY, SITUATIONAL 10/20/2010  . HYPOKALEMIA 10/22/2009  . OTHER THALASSEMIA 06/10/2008  . UNSPECIFIED OSTEOPOROSIS 05/01/2008  . Diabetes type 2, controlled 07/17/2007  . HYPERCHOLESTEROLEMIA 07/17/2007  . ANEMIA, CHRONIC 07/17/2007  . Essential hypertension 07/17/2007  . PREMATURE VENTRICULAR CONTRACTIONS, FREQUENT 07/17/2007  . UTI'S, RECURRENT 07/17/2007   Past Medical History  Diagnosis Date  . Diabetes mellitus     type II  . Dizziness     or vertigo  . Hypertension   . Hyperlipidemia   . Anemia   . Glaucoma    Past Surgical History  Procedure Laterality Date  . Rotator cuff repair  01/2002  . Bladder papilloma  2006  . Vaginal hysterectomy     History  Substance Use Topics  . Smoking status: Never Smoker   . Smokeless tobacco: Not on file  . Alcohol Use: No   Family History  Problem Relation Age of Onset  . Heart disease Mother     MI  . Heart attack Mother   . Hypertension Mother   . Heart disease Father     MI  . Heart attack Father   . Hypertension Father    Allergies    Allergen Reactions  . Atorvastatin     REACTION: cramps  . Risedronate Sodium     Vomiting   . Sulfonamide Derivatives     REACTION: rash   Current Outpatient Prescriptions on File Prior to Visit  Medication Sig Dispense Refill  . bimatoprost (LUMIGAN) 0.03 % ophthalmic solution Place 1 drop into both eyes at bedtime.      . brimonidine (ALPHAGAN P) 0.1 % SOLN Place 1 drop into the left eye 2 (two) times daily.      . calcium citrate (CALCITRATE - DOSED IN MG ELEMENTAL CALCIUM) 950 MG tablet Take 1 tablet by mouth 2 (two) times daily.    . cholecalciferol (VITAMIN D) 1000 UNITS tablet Take 2,000 Units by mouth daily.     . CRESTOR 20 MG tablet TAKE 1 TABLET EVERY DAY 30 tablet 5  . estradiol (ESTRACE) 0.1 MG/GM vaginal cream Use small amount intravaginally twice weekly as directed. 42.5 g 1  . fluticasone (FLONASE) 50 MCG/ACT nasal spray PLACE 2 SPRAYS INTO THE NOSE DAILY AS NEEDED. 16 g 2  . hydrochlorothiazide (HYDRODIURIL) 25 MG tablet Take 0.5 tablets (12.5 mg total) by mouth daily. 45 tablet 3  . hydrochlorothiazide (HYDRODIURIL) 25 MG tablet  TAKE 1/2 TABLET BY MOUTH DAILY 45 tablet 3  . metFORMIN (GLUCOPHAGE) 500 MG tablet TAKE 1 TABLET TWICE A DAY WITH A MEAL 60 tablet 11  . metoprolol (LOPRESSOR) 50 MG tablet TAKE 1 TABLET BY MOUTH 2 TIMES DAILY. 60 tablet 3  . potassium chloride SA (KLOR-CON M20) 20 MEQ tablet Take 1 tablet (20 mEq total) by mouth daily. *Follow-up appt. required before future refills are given* 30 tablet 1   No current facility-administered medications on file prior to visit.     Review of Systems Review of Systems  Constitutional: Negative for fever, appetite change, fatigue and unexpected weight change.  Eyes: Negative for pain and visual disturbance.  Respiratory: Negative for cough and shortness of breath.   Cardiovascular: Negative for cp or palpitations    Gastrointestinal: Negative for nausea, diarrhea and constipation.  Genitourinary: Negative  for urgency and frequency.  Skin: Negative for pallor or rash   Neurological: Negative for weakness, light-headedness, numbness and headaches.  Hematological: Negative for adenopathy. Does not bruise/bleed easily.  Psychiatric/Behavioral: Negative for dysphoric mood. The patient is not nervous/anxious.         Objective:   Physical Exam  Constitutional: She appears well-developed and well-nourished. No distress.  overwt and well appearing   HENT:  Head: Normocephalic and atraumatic.  Eyes: Conjunctivae and EOM are normal. Pupils are equal, round, and reactive to light. Right eye exhibits no discharge. Left eye exhibits no discharge.  Neck: Normal range of motion. Neck supple. No JVD present. No thyromegaly present.  Cardiovascular: Normal rate, regular rhythm, normal heart sounds and intact distal pulses.  Exam reveals no gallop.   Pulmonary/Chest: Effort normal and breath sounds normal. No respiratory distress. She has no wheezes. She has no rales.  Musculoskeletal: She exhibits no edema or tenderness.  Lymphadenopathy:    She has no cervical adenopathy.  Neurological: She is alert. She has normal reflexes. No cranial nerve deficit. She exhibits normal muscle tone. Coordination normal.  Skin: Skin is warm and dry. No rash noted. No erythema. No pallor.  Psychiatric: She has a normal mood and affect.          Assessment & Plan:   Problem List Items Addressed This Visit      Cardiovascular and Mediastinum   Essential hypertension - Primary    Not in optimal control  BP: (!) 160/90 mmHg   increase lisinopril to 20 mg daily  F/u planned  Disc low sodium diet/exercise      Relevant Medications      lisinopril (PRINIVIL,ZESTRIL) tablet     Endocrine   Diabetes type 2, controlled    A1C today Disc lifestyle change  Continues metformin     Relevant Medications      lisinopril (PRINIVIL,ZESTRIL) tablet   Other Relevant Orders      Hemoglobin A1c (Completed)    Other  Visit Diagnoses    Need for vaccination with 13-polyvalent pneumococcal conjugate vaccine        Relevant Orders       Pneumococcal conjugate vaccine 13-valent (Completed)    Need for prophylactic vaccination and inoculation against influenza        Relevant Orders       Flu Vaccine QUAD 36+ mos PF IM (Fluarix Quad PF) (Completed)

## 2014-09-13 NOTE — Assessment & Plan Note (Signed)
Not in optimal control  BP: (!) 160/90 mmHg   increase lisinopril to 20 mg daily  F/u planned  Disc low sodium diet/exercise

## 2014-09-13 NOTE — Assessment & Plan Note (Signed)
A1C today Disc lifestyle change  Continues metformin

## 2014-09-14 ENCOUNTER — Encounter: Payer: Self-pay | Admitting: *Deleted

## 2014-09-18 ENCOUNTER — Telehealth: Payer: Self-pay | Admitting: Family Medicine

## 2014-09-18 NOTE — Telephone Encounter (Signed)
Racheal Patches, NP from [employer] called to report pt's elevated BP (180/80) during home visit.  She is asymptomatic.  Pt was seen by Dr. Glori Bickers last week.  Lisinopril was increased to 20mg  daily.  She has been taking (1) 10mg  tablet in the AM and (1) 10mg  tablet at noon.  She has another RX for 20mg  tablet that she will be filling soon.  NP advised her to take both 10mg  tablets together at the same time.  Pt agreed.

## 2014-09-18 NOTE — Telephone Encounter (Signed)
Agree with that - take both pills at the same time once daily  Check bp when relaxed  Let me know how it is next week or earlier if necessary or if any symptoms like headache or chest pain

## 2014-09-21 NOTE — Telephone Encounter (Signed)
Spoke with pt and she has been taking the 2 tabs of lisinopril together but she doesn't have a blood pressure cuff so she hasn't taken BP since NP from insurance took it. Pt said it's going to be hard to get to a pharmacy to check it but once she does she will call back and update [place] of her BP reading. Pt advise to let us know if HA, or chest pain develops, pt verbalized understanding

## 2014-09-22 ENCOUNTER — Telehealth: Payer: Self-pay

## 2014-09-22 NOTE — Telephone Encounter (Signed)
PLEASE NOTE: All timestamps contained within this report are represented as [place] Standard Time. CONFIDENTIALTY NOTICE: This fax transmission is intended only for the addressee. It contains information that is legally privileged, confidential or otherwise protected from use or disclosure. If you are not the intended recipient, you are strictly prohibited from reviewing, disclosing, copying using or disseminating any of this information or taking any action in reliance on or regarding this information. If you have received this fax in error, please notify us immediately by telephone so that we can arrange for its return to [place]. Phone: [phone], Toll-Free: [phone], Fax: [phone] Page: 1 of 2 Call Id: [id number] [place] Patient Name: Kathy Cunningham Gender: Female DOB: [birth year] Age: 79 Y 10 M 11 D Return Phone Number: [phone] (Primary), [phone] (Secondary) Address: [address] City/State/Zip: [place] [address] Client [facility] - Client Client Site [place], Roque Lias Contact Type Call Call Type Triage / Clinical Caller Name [place] Relationship To Patient Friend Appointment Disposition EMR Appointment Not Necessary Return Phone Number [phone] (Primary) Chief Complaint Blood Pressure High Initial Comment Caller states blood pressure is 210/94 PreDisposition InappropriateToAsk Nurse Assessment Nurse: Melina Modena, RN, Estill Bamberg Date/Time (Eastern Time): 09/22/2014 5:20:09 PM Confirm and document reason for call. If symptomatic, describe symptoms. ---Caller states blood pressure is 210/94; pt is on BP and hasn't missed any doses Has the patient traveled out of the country within the last 30 days? ---Not Applicable Does the patient require triage? ---Yes Related visit to physician within the last 2  weeks? ---No Does the PT have any chronic conditions? (i.e. diabetes, asthma, etc.) ---Yes List chronic conditions. ---htn, diabetes Guidelines Guideline Title Affirmed Question Affirmed Notes Nurse Date/Time (Eastern Time) High Blood Pressure BP # 180/110 [place], Lurline Hare 09/22/2014 5:26:03 PM Disp. Time Eilene Ghazi Time) Disposition Final User 09/22/2014 5:31:14 PM See Physician within 24 Hours Yes Melina Modena, RN, Shelly Coss Understands: Yes Disagree/Comply: Comply Care Advice Given Per Guideline PLEASE NOTE: All timestamps contained within this report are represented as [place] Standard Time. CONFIDENTIALTY NOTICE: This fax transmission is intended only for the addressee. It contains information that is legally privileged, confidential or otherwise protected from use or disclosure. If you are not the intended recipient, you are strictly prohibited from reviewing, disclosing, copying using or disseminating any of this information or taking any action in reliance on or regarding this information. If you have received this fax in error, please notify us immediately by telephone so that we can arrange for its return to [place]. Phone: [phone], Toll-Free: [phone], Fax: [phone] Page: 2 of 2 Call Id: [id number] Care Advice Given Per Guideline SEE PHYSICIAN WITHIN 24 HOURS: * IF OFFICE WILL BE OPEN: You need to be examined within the next 24 hours. Call your doctor when the office opens, and make an appointment. CALL BACK IF: * Chest pain or difficulty breathing occurs * You become worse. CARE ADVICE given per High Blood Pressure (Adult) guideline. After Care Instructions Given Call Event Type User Date / Time Description Referrals REFERRED TO PCP OFFICE

## 2014-09-22 NOTE — Telephone Encounter (Signed)
Please schedule appt with first avail

## 2014-09-23 ENCOUNTER — Ambulatory Visit (INDEPENDENT_AMBULATORY_CARE_PROVIDER_SITE_OTHER): Payer: Medicare Other | Admitting: Family Medicine

## 2014-09-23 ENCOUNTER — Encounter: Payer: Self-pay | Admitting: Family Medicine

## 2014-09-23 VITALS — BP 172/82 | HR 82 | Temp 97.5°F | Wt 137.8 lb

## 2014-09-23 DIAGNOSIS — I10 Essential (primary) hypertension: Secondary | ICD-10-CM

## 2014-09-23 MED ORDER — LOSARTAN POTASSIUM-HCTZ 100-25 MG PO TABS: 1.0000 | ORAL_TABLET | Freq: Every day | ORAL | Status: DC

## 2014-09-23 MED ORDER — POTASSIUM CHLORIDE CRYS ER 20 MEQ PO TBCR: 20.0000 meq | EXTENDED_RELEASE_TABLET | Freq: Every day | ORAL | Status: DC

## 2014-09-23 NOTE — Telephone Encounter (Signed)
appt scheduled with Dr. Glori Bickers today at 12:30pm

## 2014-09-23 NOTE — Progress Notes (Signed)
Pre visit review using our clinic review tool, if applicable. No additional management support is needed unless otherwise documented below in the visit note. 

## 2014-09-23 NOTE — Progress Notes (Signed)
Subjective:    Patient ID: Kathy Cunningham, female    DOB: 1935/09/15, 79 y.o.   MRN: [id number]  HPI Here for inc bp   Was seen a week ago - and inc her lisinopril to 20   BP Readings from Last 3 Encounters:  09/23/14 172/82  09/11/14 160/90  12/30/13 158/74    Was 200/114 at the dentist Also systolic at home was over 200  210-94 at the [facility]  At dentist again yesterday it was quite high - systolic still over 951   Is on lisinopril 20, hctz 25 and metoprolol 50   Last week felt a little swimmy headed and her eyes hurt - pressure behind them  That is better now No leg swelling or cp or sob   Patient Active Problem List   Diagnosis Date Noted  . Breast cancer screening 12/17/2013  . Comedone 06/13/2013  . Tachycardia 04/21/2013  . Other screening mammogram 04/24/2011  . Glaucoma 04/24/2011  . Vitamin D deficiency 04/24/2011  . ANXIETY, SITUATIONAL 10/20/2010  . HYPOKALEMIA 10/22/2009  . OTHER THALASSEMIA 06/10/2008  . UNSPECIFIED OSTEOPOROSIS 05/01/2008  . Diabetes type 2, controlled 07/17/2007  . HYPERCHOLESTEROLEMIA 07/17/2007  . ANEMIA, CHRONIC 07/17/2007  . Essential hypertension 07/17/2007  . PREMATURE VENTRICULAR CONTRACTIONS, FREQUENT 07/17/2007  . UTI'S, RECURRENT 07/17/2007   Past Medical History  Diagnosis Date  . Diabetes mellitus     type II  . Dizziness     or vertigo  . Hypertension   . Hyperlipidemia   . Anemia   . Glaucoma    Past Surgical History  Procedure Laterality Date  . Rotator cuff repair  01/2002  . Bladder papilloma  2006  . Vaginal hysterectomy     History  Substance Use Topics  . Smoking status: Never Smoker   . Smokeless tobacco: Not on file  . Alcohol Use: No   Family History  Problem Relation Age of Onset  . Heart disease Mother     MI  . Heart attack Mother   . Hypertension Mother   . Heart disease Father     MI  . Heart attack Father   . Hypertension Father    Allergies  Allergen Reactions  . Atorvastatin      REACTION: cramps  . Risedronate Sodium     Vomiting   . Sulfonamide Derivatives     REACTION: rash   Current Outpatient Prescriptions on File Prior to Visit  Medication Sig Dispense Refill  . bimatoprost (LUMIGAN) 0.03 % ophthalmic solution Place 1 drop into both eyes at bedtime.      . brimonidine (ALPHAGAN P) 0.1 % SOLN Place 1 drop into the left eye 2 (two) times daily.      . calcium citrate (CALCITRATE - DOSED IN MG ELEMENTAL CALCIUM) 950 MG tablet Take 1 tablet by mouth 2 (two) times daily.    . cholecalciferol (VITAMIN D) 1000 UNITS tablet Take 2,000 Units by mouth daily.     . CRESTOR 20 MG tablet TAKE 1 TABLET EVERY DAY 30 tablet 5  . estradiol (ESTRACE) 0.1 MG/GM vaginal cream Use small amount intravaginally twice weekly as directed. 42.5 g 1  . fluticasone (FLONASE) 50 MCG/ACT nasal spray PLACE 2 SPRAYS INTO THE NOSE DAILY AS NEEDED. 16 g 2  . hydrochlorothiazide (HYDRODIURIL) 25 MG tablet Take 0.5 tablets (12.5 mg total) by mouth daily. 45 tablet 3  . hydrochlorothiazide (HYDRODIURIL) 25 MG tablet TAKE 1/2 TABLET BY MOUTH DAILY 45 tablet 3  .  lisinopril (PRINIVIL,ZESTRIL) 20 MG tablet Take 1 tablet (20 mg total) by mouth daily. 30 tablet 3  . metFORMIN (GLUCOPHAGE) 500 MG tablet TAKE 1 TABLET TWICE A DAY WITH A MEAL 60 tablet 11  . metoprolol (LOPRESSOR) 50 MG tablet TAKE 1 TABLET BY MOUTH 2 TIMES DAILY. 60 tablet 3  . potassium chloride SA (KLOR-CON M20) 20 MEQ tablet Take 1 tablet (20 mEq total) by mouth daily. *Follow-up appt. required before future refills are given* 30 tablet 1   No current facility-administered medications on file prior to visit.     Review of Systems Review of Systems  Constitutional: Negative for fever, appetite change, fatigue and unexpected weight change.  Eyes: Negative for pain and visual disturbance.  Respiratory: Negative for cough and shortness of breath.   Cardiovascular: Negative for cp or palpitations    Gastrointestinal: Negative for  nausea, diarrhea and constipation.  Genitourinary: Negative for urgency and frequency.  Skin: Negative for pallor or rash   Neurological: Negative for weakness, light-headedness, numbness and headaches.  Hematological: Negative for adenopathy. Does not bruise/bleed easily.  Psychiatric/Behavioral: Negative for dysphoric mood. The patient is not nervous/anxious.         Objective:   Physical Exam  Constitutional: She appears well-developed and well-nourished. No distress.  HENT:  Head: Normocephalic and atraumatic.  Mouth/Throat: Oropharynx is clear and moist.  Eyes: Conjunctivae and EOM are normal. Pupils are equal, round, and reactive to light.  Neck: Normal range of motion. Neck supple. No JVD present. Carotid bruit is not present. No thyromegaly present.  Cardiovascular: Normal rate, regular rhythm, normal heart sounds and intact distal pulses.   Pulmonary/Chest: Effort normal and breath sounds normal. No respiratory distress. She has no wheezes. She has no rales.  Musculoskeletal: She exhibits no edema.  Lymphadenopathy:    She has no cervical adenopathy.  Neurological: She is alert. She has normal reflexes.  Skin: Skin is warm and dry. No rash noted. No erythema. No pallor.  Psychiatric: She has a normal mood and affect.          Assessment & Plan:   Problem List Items Addressed This Visit      Cardiovascular and Mediastinum   Essential hypertension - Primary    Not controlled  Will stop lisinopril and hct and replace with losartan hct 100-25 Disc poss side eff Rev DASH diet and lifestyle change Taught how to check her bp      Relevant Medications   LOSARTAN POTASSIUM-HCTZ 100-25 MG PO TABS

## 2014-09-23 NOTE — Patient Instructions (Addendum)
Stop lisinopril  Stop hctz   Start losartan hct 100-25 one pill daily (this replaces both the lisinopril and hctz)  Keep a check on blood pressure  If no improvement in the next week please let me know   Watch out for excess sodium (salt)  Drink lots of water  Follow up with me on Feb 8th as planned

## 2014-09-25 NOTE — Assessment & Plan Note (Signed)
Not controlled  Will stop lisinopril and hct and replace with losartan hct 100-25 Disc poss side eff Rev DASH diet and lifestyle change Taught how to check her bp

## 2014-09-29 ENCOUNTER — Other Ambulatory Visit: Payer: Self-pay | Admitting: Family Medicine

## 2014-10-07 ENCOUNTER — Other Ambulatory Visit: Payer: Self-pay | Admitting: *Deleted

## 2014-10-07 MED ORDER — ROSUVASTATIN CALCIUM 20 MG PO TABS: 20.0000 mg | ORAL_TABLET | Freq: Every day | ORAL | Status: DC

## 2014-10-07 MED ORDER — POTASSIUM CHLORIDE CRYS ER 20 MEQ PO TBCR: 20.0000 meq | EXTENDED_RELEASE_TABLET | Freq: Every day | ORAL | Status: DC

## 2014-10-07 MED ORDER — LOSARTAN POTASSIUM-HCTZ 100-25 MG PO TABS: 1.0000 | ORAL_TABLET | Freq: Every day | ORAL | Status: DC

## 2014-10-07 MED ORDER — METOPROLOL TARTRATE 50 MG PO TABS: 50.0000 mg | ORAL_TABLET | Freq: Two times a day (BID) | ORAL | Status: DC

## 2014-10-07 MED ORDER — METFORMIN HCL 500 MG PO TABS: 500.0000 mg | ORAL_TABLET | Freq: Every day | ORAL | Status: DC

## 2014-10-07 NOTE — Telephone Encounter (Signed)
Faxed in px for patient. Patient switch pharmacy.

## 2014-10-07 NOTE — Telephone Encounter (Signed)
Faxed in request for refill. Patient switch pharmacy.

## 2014-10-12 ENCOUNTER — Ambulatory Visit (INDEPENDENT_AMBULATORY_CARE_PROVIDER_SITE_OTHER): Payer: Medicare Other | Admitting: Family Medicine

## 2014-10-12 ENCOUNTER — Encounter: Payer: Self-pay | Admitting: Family Medicine

## 2014-10-12 VITALS — BP 168/80 | HR 80 | Temp 97.4°F | Ht 61.0 in | Wt 137.1 lb

## 2014-10-12 DIAGNOSIS — I10 Essential (primary) hypertension: Secondary | ICD-10-CM

## 2014-10-12 LAB — COMPREHENSIVE METABOLIC PANEL
ALT: 11 U/L (ref 0–35)
AST: 19 U/L (ref 0–37)
Albumin: 4.1 g/dL (ref 3.5–5.2)
Alkaline Phosphatase: 49 U/L (ref 39–117)
BUN: 18 mg/dL (ref 6–23)
CALCIUM: 9.7 mg/dL (ref 8.4–10.5)
CO2: 28 meq/L (ref 19–32)
Chloride: 100 mEq/L (ref 96–112)
Creatinine, Ser: 0.9 mg/dL (ref 0.40–1.20)
GFR: 78.1 mL/min (ref >60.00)
GLUCOSE: 174 mg/dL — AB (ref 70–99)
Potassium: 3.6 mEq/L (ref 3.5–5.1)
SODIUM: 137 meq/L (ref 135–145)
Total Bilirubin: 0.7 mg/dL (ref 0.2–1.2)
Total Protein: 7 g/dL (ref 6.0–8.3)

## 2014-10-12 LAB — LIPID PANEL
CHOLESTEROL: 150 mg/dL (ref 0–200)
HDL: 61.4 mg/dL (ref >39.00)
LDL CALC: 76 mg/dL (ref 0–99)
NonHDL: 88.6
Total CHOL/HDL Ratio: 2
Triglycerides: 63 mg/dL (ref 0.0–149.0)
VLDL: 12.6 mg/dL (ref 0.0–40.0)

## 2014-10-12 LAB — CBC WITH DIFFERENTIAL/PLATELET
Basophils Absolute: 0 10*3/uL (ref 0.0–0.1)
Basophils Relative: 1 % (ref 0.0–3.0)
Eosinophils Absolute: 0.1 10*3/uL (ref 0.0–0.7)
Eosinophils Relative: 3 % (ref 0.0–5.0)
HEMATOCRIT: 35.7 % — AB (ref 36.0–46.0)
HEMOGLOBIN: 11.8 g/dL — AB (ref 12.0–15.0)
Lymphocytes Relative: 37.3 % (ref 12.0–46.0)
Lymphs Abs: 1.7 10*3/uL (ref 0.7–4.0)
MCHC: 33.2 g/dL (ref 30.0–36.0)
MCV: 92.8 fl (ref 78.0–100.0)
MONO ABS: 0.5 10*3/uL (ref 0.1–1.0)
Monocytes Relative: 10.3 % (ref 3.0–12.0)
NEUTROS ABS: 2.3 10*3/uL (ref 1.4–7.7)
Neutrophils Relative %: 48.4 % (ref 43.0–77.0)
PLATELETS: 258 10*3/uL (ref 150.0–400.0)
RBC: 3.85 Mil/uL — AB (ref 3.87–5.11)
RDW: 13.9 % (ref 11.5–15.5)
WBC: 4.7 10*3/uL (ref 4.0–10.5)

## 2014-10-12 LAB — TSH: TSH: 1.07 u[IU]/mL (ref 0.35–4.50)

## 2014-10-12 MED ORDER — METOPROLOL TARTRATE 50 MG PO TABS: 100.0000 mg | ORAL_TABLET | Freq: Two times a day (BID) | ORAL | Status: DC

## 2014-10-12 MED ORDER — LOSARTAN POTASSIUM-HCTZ 100-25 MG PO TABS: 1.0000 | ORAL_TABLET | Freq: Every day | ORAL | Status: DC

## 2014-10-12 NOTE — Patient Instructions (Signed)
Drink lots of water Avoid high sodium foods= see the handout I gave you  Stay active  Increase metoprolol to 2 pills twice daily (100 mg twice daily) - if you feel faint or dizzy or if heart rate goes below 60- please let me know Continue  the losartan hct Labs today  Follow up in about 1 month

## 2014-10-12 NOTE — Progress Notes (Signed)
Subjective:    Patient ID: Kathy Cunningham, female    DOB: 1936/08/04, 79 y.o.   MRN: [id number]  HPI Here for f/u of HTN and other chronic conditions   Wt is stable   On Sunday- bp was 160/72 --overall improved  Feels better- like she has more energy  No CP or headaches or sob   BP Readings from Last 3 Encounters:  10/12/14 168/80  09/23/14 172/82  09/11/14 160/90    Now on losartan hct (from lisinopril)- feels fine with that - no side effects at all , does urinate often with hct  Also metoprolol 50 mg bid    (pulse is 80)  Still has heart palpitations on that    DM2 Lab Results  Component Value Date   HGBA1C 6.5 09/11/2014   On metformin   Patient Active Problem List   Diagnosis Date Noted  . Breast cancer screening 12/17/2013  . Comedone 06/13/2013  . Tachycardia 04/21/2013  . Other screening mammogram 04/24/2011  . Glaucoma 04/24/2011  . Vitamin D deficiency 04/24/2011  . ANXIETY, SITUATIONAL 10/20/2010  . HYPOKALEMIA 10/22/2009  . OTHER THALASSEMIA 06/10/2008  . UNSPECIFIED OSTEOPOROSIS 05/01/2008  . Diabetes type 2, controlled 07/17/2007  . HYPERCHOLESTEROLEMIA 07/17/2007  . ANEMIA, CHRONIC 07/17/2007  . Essential hypertension 07/17/2007  . PREMATURE VENTRICULAR CONTRACTIONS, FREQUENT 07/17/2007  . UTI'S, RECURRENT 07/17/2007   Past Medical History  Diagnosis Date  . Diabetes mellitus     type II  . Dizziness     or vertigo  . Hypertension   . Hyperlipidemia   . Anemia   . Glaucoma    Past Surgical History  Procedure Laterality Date  . Rotator cuff repair  01/2002  . Bladder papilloma  2006  . Vaginal hysterectomy     History  Substance Use Topics  . Smoking status: Never Smoker   . Smokeless tobacco: Not on file  . Alcohol Use: No   Family History  Problem Relation Age of Onset  . Heart disease Mother     MI  . Heart attack Mother   . Hypertension Mother   . Heart disease Father     MI  . Heart attack Father   . Hypertension  Father    Allergies  Allergen Reactions  . Atorvastatin     REACTION: cramps  . Risedronate Sodium     Vomiting   . Sulfonamide Derivatives     REACTION: rash   Current Outpatient Prescriptions on File Prior to Visit  Medication Sig Dispense Refill  . bimatoprost (LUMIGAN) 0.03 % ophthalmic solution Place 1 drop into both eyes at bedtime.      . brimonidine (ALPHAGAN P) 0.1 % SOLN Place 1 drop into the left eye 2 (two) times daily.      . calcium citrate (CALCITRATE - DOSED IN MG ELEMENTAL CALCIUM) 950 MG tablet Take 1 tablet by mouth 2 (two) times daily.    . cholecalciferol (VITAMIN D) 1000 UNITS tablet Take 2,000 Units by mouth daily.     Marland Kitchen estradiol (ESTRACE) 0.1 MG/GM vaginal cream Use small amount intravaginally twice weekly as directed. 42.5 g 1  . fluticasone (FLONASE) 50 MCG/ACT nasal spray PLACE 2 SPRAYS INTO THE NOSE DAILY AS NEEDED. 16 g 2  . losartan-hydrochlorothiazide (HYZAAR) 100-25 MG per tablet Take 1 tablet by mouth daily. 30 tablet 3  . metFORMIN (GLUCOPHAGE) 500 MG tablet Take 1 tablet (500 mg total) by mouth daily with breakfast. 60 tablet 11  . metoprolol (  LOPRESSOR) 50 MG tablet Take 1 tablet (50 mg total) by mouth 2 (two) times daily. 60 tablet 3  . potassium chloride SA (KLOR-CON M20) 20 MEQ tablet Take 1 tablet (20 mEq total) by mouth daily. 30 tablet 11  . rosuvastatin (CRESTOR) 20 MG tablet Take 1 tablet (20 mg total) by mouth daily. 30 tablet 1   No current facility-administered medications on file prior to visit.    Review of Systems Review of Systems  Constitutional: Negative for fever, appetite change, fatigue and unexpected weight change.  Eyes: Negative for pain and visual disturbance.  Respiratory: Negative for cough and shortness of breath.   Cardiovascular: Negative for cp or palpitations    Gastrointestinal: Negative for nausea, diarrhea and constipation.  Genitourinary: Negative for urgency and frequency.  Skin: Negative for pallor or rash     Neurological: Negative for weakness, light-headedness, numbness and headaches.  Hematological: Negative for adenopathy. Does not bruise/bleed easily.  Psychiatric/Behavioral: Negative for dysphoric mood. The patient is not nervous/anxious.         Objective:   Physical Exam  Constitutional: She appears well-developed and well-nourished. No distress.  HENT:  Head: Normocephalic and atraumatic.  Mouth/Throat: Oropharynx is clear and moist.  Eyes: Conjunctivae and EOM are normal. Pupils are equal, round, and reactive to light. No scleral icterus.  Neck: Normal range of motion. Neck supple. No JVD present. No thyromegaly present.  Cardiovascular: Normal rate, regular rhythm, normal heart sounds and intact distal pulses.  Exam reveals no gallop.   Pulmonary/Chest: Effort normal and breath sounds normal. No respiratory distress. She has no wheezes. She has no rales.  Abdominal: Soft. Bowel sounds are normal. She exhibits no distension and no mass. There is no tenderness.  Musculoskeletal: She exhibits no edema.  Lymphadenopathy:    She has no cervical adenopathy.  Neurological: She is alert. She has normal reflexes. No cranial nerve deficit. She exhibits normal muscle tone. Coordination normal.  Skin: Skin is warm and dry. No rash noted. No erythema. No pallor.  Psychiatric: She has a normal mood and affect.          Assessment & Plan:   Problem List Items Addressed This Visit      Cardiovascular and Mediastinum   Essential hypertension - Primary    Improved with losartan hct Not yet at goal Will inc metoprolol to 100 mg bid - as tol/ disc watch for low pulse/dizziness F/u planned  Rev lifestyle change       Relevant Medications   metoprolol (LOPRESSOR) tablet   losartan-hydrochlorothiazide (HYZAAR) 100-25 MG per tablet   Other Relevant Orders   CBC with Differential/Platelet (Completed)   Comprehensive metabolic panel (Completed)   TSH (Completed)   Lipid panel  (Completed)

## 2014-10-12 NOTE — Assessment & Plan Note (Signed)
Improved with losartan hct Not yet at goal Will inc metoprolol to 100 mg bid - as tol/ disc watch for low pulse/dizziness F/u planned  Rev lifestyle change

## 2014-10-12 NOTE — Progress Notes (Signed)
Pre visit review using our clinic review tool, if applicable. No additional management support is needed unless otherwise documented below in the visit note. 

## 2014-10-13 ENCOUNTER — Encounter: Payer: Self-pay | Admitting: *Deleted

## 2014-11-01 ENCOUNTER — Other Ambulatory Visit: Payer: Self-pay | Admitting: Cardiovascular Disease

## 2014-11-10 ENCOUNTER — Ambulatory Visit (INDEPENDENT_AMBULATORY_CARE_PROVIDER_SITE_OTHER): Payer: Medicare Other | Admitting: Family Medicine

## 2014-11-10 ENCOUNTER — Encounter: Payer: Self-pay | Admitting: Family Medicine

## 2014-11-10 VITALS — BP 162/78 | HR 70 | Temp 97.7°F | Ht 60.25 in | Wt 139.8 lb

## 2014-11-10 DIAGNOSIS — I10 Essential (primary) hypertension: Secondary | ICD-10-CM

## 2014-11-10 NOTE — Assessment & Plan Note (Signed)
Pt did not inc her metoprolol as prev discussed  bp is the same  She will inc metoprolol to two 50 mg tabs bid (100 mg bid)  If this does not work-consider amlodipine   Disc lifestyle change F/u 4-6 wk for re check  Adv checking at home while relaxed

## 2014-11-10 NOTE — Patient Instructions (Signed)
Increase your metoprolol to 2 pills twice daily  Continue other medicines  If dizzy or low blood pressure or pulse less than 50-please call and let us know   Follow up with me in 4-6 weeks   (earlier if any problems)

## 2014-11-10 NOTE — Progress Notes (Signed)
Subjective:    Patient ID: Kathy Cunningham, female    DOB: 1935/10/25, 79 y.o.   MRN: [id number]  HPI Here for f/u of HTN   Last visit inc her metoprolol 100 bid - it turns out that she did not do it - she got confused /does not know why On losartan hct   BP Readings from Last 3 Encounters:  11/10/14 162/78  10/12/14 168/80  09/23/14 172/82   no headache or edema or cp Feels fine -no side effects -doing ok   Patient Active Problem List   Diagnosis Date Noted  . Breast cancer screening 12/17/2013  . Comedone 06/13/2013  . Tachycardia 04/21/2013  . Other screening mammogram 04/24/2011  . Glaucoma 04/24/2011  . Vitamin D deficiency 04/24/2011  . ANXIETY, SITUATIONAL 10/20/2010  . HYPOKALEMIA 10/22/2009  . OTHER THALASSEMIA 06/10/2008  . UNSPECIFIED OSTEOPOROSIS 05/01/2008  . Diabetes type 2, controlled 07/17/2007  . HYPERCHOLESTEROLEMIA 07/17/2007  . ANEMIA, CHRONIC 07/17/2007  . Essential hypertension 07/17/2007  . PREMATURE VENTRICULAR CONTRACTIONS, FREQUENT 07/17/2007  . UTI'S, RECURRENT 07/17/2007   Past Medical History  Diagnosis Date  . Diabetes mellitus     type II  . Dizziness     or vertigo  . Hypertension   . Hyperlipidemia   . Anemia   . Glaucoma    Past Surgical History  Procedure Laterality Date  . Rotator cuff repair  01/2002  . Bladder papilloma  2006  . Vaginal hysterectomy     History  Substance Use Topics  . Smoking status: Never Smoker   . Smokeless tobacco: Not on file  . Alcohol Use: No   Family History  Problem Relation Age of Onset  . Heart disease Mother     MI  . Heart attack Mother   . Hypertension Mother   . Heart disease Father     MI  . Heart attack Father   . Hypertension Father    Allergies  Allergen Reactions  . Atorvastatin     REACTION: cramps  . Risedronate Sodium     Vomiting   . Sulfonamide Derivatives     REACTION: rash   Current Outpatient Prescriptions on File Prior to Visit  Medication Sig Dispense  Refill  . bimatoprost (LUMIGAN) 0.03 % ophthalmic solution Place 1 drop into both eyes at bedtime.      . brimonidine (ALPHAGAN P) 0.1 % SOLN Place 1 drop into the left eye 2 (two) times daily.      . calcium citrate (CALCITRATE - DOSED IN MG ELEMENTAL CALCIUM) 950 MG tablet Take 1 tablet by mouth 2 (two) times daily.    . cholecalciferol (VITAMIN D) 1000 UNITS tablet Take 2,000 Units by mouth daily.     Marland Kitchen estradiol (ESTRACE) 0.1 MG/GM vaginal cream Use small amount intravaginally twice weekly as directed. 42.5 g 1  . fluticasone (FLONASE) 50 MCG/ACT nasal spray PLACE 2 SPRAYS INTO THE NOSE DAILY AS NEEDED. 16 g 2  . losartan-hydrochlorothiazide (HYZAAR) 100-25 MG per tablet Take 1 tablet by mouth daily. 90 tablet 3  . metFORMIN (GLUCOPHAGE) 500 MG tablet Take 1 tablet (500 mg total) by mouth daily with breakfast. 60 tablet 11  . metoprolol (LOPRESSOR) 50 MG tablet Take 2 tablets (100 mg total) by mouth 2 (two) times daily. 60 tablet 3  . potassium chloride SA (KLOR-CON M20) 20 MEQ tablet Take 1 tablet (20 mEq total) by mouth daily. 30 tablet 11  . rosuvastatin (CRESTOR) 20 MG tablet Take 1 tablet (  20 mg total) by mouth daily. 30 tablet 1   No current facility-administered medications on file prior to visit.    Review of Systems Review of Systems  Constitutional: Negative for fever, appetite change, fatigue and unexpected weight change.  Eyes: Negative for pain and visual disturbance.  Respiratory: Negative for cough and shortness of breath.   Cardiovascular: Negative for cp or palpitations    Gastrointestinal: Negative for nausea, diarrhea and constipation.  Genitourinary: Negative for urgency and frequency.  Skin: Negative for pallor or rash   Neurological: Negative for weakness, light-headedness, numbness and headaches.  Hematological: Negative for adenopathy. Does not bruise/bleed easily.  Psychiatric/Behavioral: Negative for dysphoric mood. The patient is not nervous/anxious.          Objective:   Physical Exam  Constitutional: She appears well-developed and well-nourished. No distress.  HENT:  Head: Normocephalic and atraumatic.  Mouth/Throat: Oropharynx is clear and moist.  Eyes: Conjunctivae and EOM are normal. Pupils are equal, round, and reactive to light. No scleral icterus.  Neck: Normal range of motion. Neck supple. Carotid bruit is not present.  Cardiovascular: Normal rate, regular rhythm and normal heart sounds.   Pulmonary/Chest: Effort normal and breath sounds normal. No respiratory distress.  Musculoskeletal: She exhibits no edema.  Lymphadenopathy:    She has no cervical adenopathy.  Neurological: She is alert.  Skin: Skin is warm and dry. No rash noted.  Psychiatric: She has a normal mood and affect.          Assessment & Plan:   Problem List Items Addressed This Visit      Cardiovascular and Mediastinum   Essential hypertension - Primary    Pt did not inc her metoprolol as prev discussed  bp is the same  She will inc metoprolol to two 50 mg tabs bid (100 mg bid)  If this does not work-consider amlodipine   Disc lifestyle change F/u 4-6 wk for re check  Adv checking at home while relaxed

## 2014-11-10 NOTE — Progress Notes (Signed)
Pre visit review using our clinic review tool, if applicable. No additional management support is needed unless otherwise documented below in the visit note. 

## 2014-12-08 ENCOUNTER — Ambulatory Visit (INDEPENDENT_AMBULATORY_CARE_PROVIDER_SITE_OTHER): Payer: Medicare Other | Admitting: Family Medicine

## 2014-12-08 ENCOUNTER — Encounter: Payer: Self-pay | Admitting: Family Medicine

## 2014-12-08 VITALS — BP 130/80 | HR 70 | Temp 97.4°F | Ht 60.25 in | Wt 142.8 lb

## 2014-12-08 DIAGNOSIS — I10 Essential (primary) hypertension: Secondary | ICD-10-CM

## 2014-12-08 MED ORDER — METOPROLOL TARTRATE 100 MG PO TABS: 100.0000 mg | ORAL_TABLET | Freq: Two times a day (BID) | ORAL | Status: DC

## 2014-12-08 NOTE — Progress Notes (Signed)
Pre visit review using our clinic review tool, if applicable. No additional management support is needed unless otherwise documented below in the visit note. 

## 2014-12-08 NOTE — Assessment & Plan Note (Signed)
Much improved with inc of metoprolol to 100 bid  Nl pulse  Refilled  F/u 6 mo lab prior  bp in fair control at this time  BP Readings from Last 1 Encounters:  12/08/14 130/80   No changes needed Disc lifstyle change with low sodium diet and exercise

## 2014-12-08 NOTE — Progress Notes (Signed)
Subjective:    Patient ID: Kathy Cunningham, female    DOB: 12-14-1935, 79 y.o.   MRN: [id number]  HPI  Here for f/u of HTN  Last visit inc metoprolol to 100 bid BP Readings from Last 3 Encounters:  12/08/14 138/80  11/10/14 162/78  10/12/14 168/80   is feeling good - no side effects from new change in medicine No cp  No edema  Improved Pulse is 70    Chemistry      Component Value Date/Time   NA 137 10/12/2014 0922   NA 144 06/24/2013 1021   K 3.6 10/12/2014 0922   CL 100 10/12/2014 0922   CO2 28 10/12/2014 0922   BUN 18 10/12/2014 0922   BUN 14 06/24/2013 1021   CREATININE 0.90 10/12/2014 0922      Component Value Date/Time   CALCIUM 9.7 10/12/2014 0922   ALKPHOS 49 10/12/2014 0922   AST 19 10/12/2014 0922   ALT 11 10/12/2014 0922   BILITOT 0.7 10/12/2014 7829      Patient Active Problem List   Diagnosis Date Noted  . Breast cancer screening 12/17/2013  . Comedone 06/13/2013  . Tachycardia 04/21/2013  . Other screening mammogram 04/24/2011  . Glaucoma 04/24/2011  . Vitamin D deficiency 04/24/2011  . ANXIETY, SITUATIONAL 10/20/2010  . HYPOKALEMIA 10/22/2009  . OTHER THALASSEMIA 06/10/2008  . UNSPECIFIED OSTEOPOROSIS 05/01/2008  . Diabetes type 2, controlled 07/17/2007  . HYPERCHOLESTEROLEMIA 07/17/2007  . ANEMIA, CHRONIC 07/17/2007  . Essential hypertension 07/17/2007  . PREMATURE VENTRICULAR CONTRACTIONS, FREQUENT 07/17/2007  . UTI'S, RECURRENT 07/17/2007   Past Medical History  Diagnosis Date  . Diabetes mellitus     type II  . Dizziness     or vertigo  . Hypertension   . Hyperlipidemia   . Anemia   . Glaucoma    Past Surgical History  Procedure Laterality Date  . Rotator cuff repair  01/2002  . Bladder papilloma  2006  . Vaginal hysterectomy     History  Substance Use Topics  . Smoking status: Never Smoker   . Smokeless tobacco: Not on file  . Alcohol Use: No   Family History  Problem Relation Age of Onset  . Heart disease Mother      MI  . Heart attack Mother   . Hypertension Mother   . Heart disease Father     MI  . Heart attack Father   . Hypertension Father    Allergies  Allergen Reactions  . Atorvastatin     REACTION: cramps  . Risedronate Sodium     Vomiting   . Sulfonamide Derivatives     REACTION: rash   Current Outpatient Prescriptions on File Prior to Visit  Medication Sig Dispense Refill  . bimatoprost (LUMIGAN) 0.03 % ophthalmic solution Place 1 drop into both eyes at bedtime.      . brimonidine (ALPHAGAN P) 0.1 % SOLN Place 1 drop into the left eye 2 (two) times daily.      . calcium citrate (CALCITRATE - DOSED IN MG ELEMENTAL CALCIUM) 950 MG tablet Take 1 tablet by mouth 2 (two) times daily.    . cholecalciferol (VITAMIN D) 1000 UNITS tablet Take 2,000 Units by mouth daily.     Marland Kitchen estradiol (ESTRACE) 0.1 MG/GM vaginal cream Use small amount intravaginally twice weekly as directed. 42.5 g 1  . fluticasone (FLONASE) 50 MCG/ACT nasal spray PLACE 2 SPRAYS INTO THE NOSE DAILY AS NEEDED. 16 g 2  . losartan-hydrochlorothiazide (HYZAAR) 100-25  MG per tablet Take 1 tablet by mouth daily. 90 tablet 3  . LUMIGAN 0.01 % SOLN   6  . metFORMIN (GLUCOPHAGE) 500 MG tablet Take 1 tablet (500 mg total) by mouth daily with breakfast. 60 tablet 11  . potassium chloride SA (KLOR-CON M20) 20 MEQ tablet Take 1 tablet (20 mEq total) by mouth daily. 30 tablet 11  . rosuvastatin (CRESTOR) 20 MG tablet Take 1 tablet (20 mg total) by mouth daily. 30 tablet 1   No current facility-administered medications on file prior to visit.    Review of Systems Review of Systems  Constitutional: Negative for fever, appetite change, fatigue and unexpected weight change.  Eyes: Negative for pain and visual disturbance.  Respiratory: Negative for cough and shortness of breath.   Cardiovascular: Negative for cp or palpitations    Gastrointestinal: Negative for nausea, diarrhea and constipation.  Genitourinary: Negative for urgency  and frequency.  Skin: Negative for pallor or rash   Neurological: Negative for weakness, light-headedness, numbness and headaches.  Hematological: Negative for adenopathy. Does not bruise/bleed easily.  Psychiatric/Behavioral: Negative for dysphoric mood. The patient is not nervous/anxious.         Objective:   Physical Exam  Constitutional: She appears well-developed and well-nourished. No distress.  HENT:  Head: Normocephalic and atraumatic.  Mouth/Throat: Oropharynx is clear and moist.  Eyes: Conjunctivae and EOM are normal. Pupils are equal, round, and reactive to light.  Neck: Normal range of motion. Neck supple. No JVD present. Carotid bruit is not present. No thyromegaly present.  Cardiovascular: Normal rate, regular rhythm, normal heart sounds and intact distal pulses.  Exam reveals no gallop.   Pulmonary/Chest: Effort normal and breath sounds normal. No respiratory distress. She has no wheezes. She has no rales.  No crackles  Abdominal: Soft. Bowel sounds are normal. She exhibits no distension, no abdominal bruit and no mass. There is no tenderness.  Musculoskeletal: She exhibits no edema.  Lymphadenopathy:    She has no cervical adenopathy.  Neurological: She is alert. She has normal reflexes.  Skin: Skin is warm and dry. No rash noted.  Psychiatric: She has a normal mood and affect.          Assessment & Plan:   Problem List Items Addressed This Visit      Cardiovascular and Mediastinum   Essential hypertension - Primary    Much improved with inc of metoprolol to 100 bid  Nl pulse  Refilled  F/u 6 mo lab prior  bp in fair control at this time  BP Readings from Last 1 Encounters:  12/08/14 130/80   No changes needed Disc lifstyle change with low sodium diet and exercise        Relevant Medications   metoprolol (LOPRESSOR) tablet

## 2014-12-08 NOTE — Patient Instructions (Signed)
Blood pressure is much better!  Continue current medications  Follow up in 6 months with labs prior for annual exam

## 2014-12-14 DIAGNOSIS — H4010X2 Unspecified open-angle glaucoma, moderate stage: Secondary | ICD-10-CM | POA: Diagnosis not present

## 2014-12-14 LAB — HM DIABETES EYE EXAM

## 2015-05-06 ENCOUNTER — Other Ambulatory Visit: Payer: Self-pay | Admitting: Family Medicine

## 2015-06-03 ENCOUNTER — Encounter (HOSPITAL_COMMUNITY): Payer: Self-pay | Admitting: Emergency Medicine

## 2015-06-03 ENCOUNTER — Emergency Department (HOSPITAL_COMMUNITY): Payer: Medicare Other

## 2015-06-03 ENCOUNTER — Emergency Department (HOSPITAL_COMMUNITY)
Admission: EM | Admit: 2015-06-03 | Discharge: 2015-06-03 | Disposition: A | Discharge status: Home / Self Care | Payer: Medicare Other | Attending: Emergency Medicine | Admitting: Emergency Medicine

## 2015-06-03 DIAGNOSIS — Y9301 Activity, walking, marching and hiking: Secondary | ICD-10-CM | POA: Insufficient documentation

## 2015-06-03 DIAGNOSIS — Y999 Unspecified external cause status: Secondary | ICD-10-CM | POA: Diagnosis not present

## 2015-06-03 DIAGNOSIS — S4991XA Unspecified injury of right shoulder and upper arm, initial encounter: Secondary | ICD-10-CM | POA: Diagnosis not present

## 2015-06-03 DIAGNOSIS — W19XXXA Unspecified fall, initial encounter: Secondary | ICD-10-CM

## 2015-06-03 DIAGNOSIS — R42 Dizziness and giddiness: Secondary | ICD-10-CM | POA: Insufficient documentation

## 2015-06-03 DIAGNOSIS — W01198A Fall on same level from slipping, tripping and stumbling with subsequent striking against other object, initial encounter: Secondary | ICD-10-CM | POA: Diagnosis not present

## 2015-06-03 DIAGNOSIS — Z793 Long term (current) use of hormonal contraceptives: Secondary | ICD-10-CM | POA: Diagnosis not present

## 2015-06-03 DIAGNOSIS — I10 Essential (primary) hypertension: Secondary | ICD-10-CM | POA: Diagnosis not present

## 2015-06-03 DIAGNOSIS — Z79899 Other long term (current) drug therapy: Secondary | ICD-10-CM | POA: Diagnosis not present

## 2015-06-03 DIAGNOSIS — M25511 Pain in right shoulder: Secondary | ICD-10-CM

## 2015-06-03 DIAGNOSIS — E119 Type 2 diabetes mellitus without complications: Secondary | ICD-10-CM | POA: Diagnosis not present

## 2015-06-03 DIAGNOSIS — H409 Unspecified glaucoma: Secondary | ICD-10-CM | POA: Insufficient documentation

## 2015-06-03 DIAGNOSIS — Y9289 Other specified places as the place of occurrence of the external cause: Secondary | ICD-10-CM | POA: Insufficient documentation

## 2015-06-03 DIAGNOSIS — E785 Hyperlipidemia, unspecified: Secondary | ICD-10-CM | POA: Diagnosis not present

## 2015-06-03 MED ORDER — HYDROCODONE-ACETAMINOPHEN 5-325 MG PO TABS: 1.0000 | ORAL_TABLET | ORAL | Status: DC | PRN

## 2015-06-03 NOTE — ED Provider Notes (Signed)
CSN: [id number]     Arrival date & time 06/03/15  1042 History   First MD Initiated Contact with Patient 06/03/15 1116     Chief Complaint  Patient presents with  . Fall  . Shoulder Pain     (Consider location/radiation/quality/duration/timing/severity/associated sxs/prior Treatment) Patient is a 79 y.o. female presenting with fall and shoulder pain. The history is provided by the patient and medical records.  Fall Associated symptoms include arthralgias.  Shoulder Pain    This is a 79 year old female with history of diabetes, hypertension, vertigo, hyperlipidemia, anemia, glaucoma, presenting to the ED after a mechanical fall. Patient states she was walking into a store when she tripped and fell forward landing on her right shoulder. She states she did hit her chin on the floor but did not lose consciousness.  She states she was able to get up and walk around the store but began feeling somewhat lightheaded so she went and sat down with resolution of this sensation. She denies any current headache, dizziness, confusion, visual disturbance, changes in speech, or difficulty walking currently. She is not on any type of anticoagulation. She reports continued pain of her right shoulder.  She denies numbness or weakness of right shoulder. Patient does have history of rotator cuff injury in her right shoulder in the past, this was not surgically repaired. Patient is right-hand dominant. BP elevated on arrival, patient admits that she has not taken her home blood pressure medications yet today.  Past Medical History  Diagnosis Date  . Diabetes mellitus     type II  . Dizziness     or vertigo  . Hypertension   . Hyperlipidemia   . Anemia   . Glaucoma    Past Surgical History  Procedure Laterality Date  . Rotator cuff repair  01/2002  . Bladder papilloma  2006  . Vaginal hysterectomy     Family History  Problem Relation Age of Onset  . Heart disease Mother     MI  . Heart attack Mother    . Hypertension Mother   . Heart disease Father     MI  . Heart attack Father   . Hypertension Father    Social History  Substance Use Topics  . Smoking status: Never Smoker   . Smokeless tobacco: None  . Alcohol Use: No   OB History    No data available     Review of Systems  Musculoskeletal: Positive for arthralgias.  All other systems reviewed and are negative.     Allergies  Atorvastatin; Risedronate sodium; and Sulfonamide derivatives  Home Medications   Prior to Admission medications   Medication Sig Start Date End Date Taking? Authorizing Provider  bimatoprost (LUMIGAN) 0.03 % ophthalmic solution Place 1 drop into both eyes at bedtime.      Historical Provider, MD  brimonidine (ALPHAGAN P) 0.1 % SOLN Place 1 drop into the left eye 2 (two) times daily.      Historical Provider, MD  calcium citrate (CALCITRATE - DOSED IN MG ELEMENTAL CALCIUM) 950 MG tablet Take 1 tablet by mouth 2 (two) times daily.    Historical Provider, MD  cholecalciferol (VITAMIN D) 1000 UNITS tablet Take 2,000 Units by mouth daily.     Historical Provider, MD  estradiol (ESTRACE) 0.1 MG/GM vaginal cream Use small amount intravaginally twice weekly as directed. 12/20/11   Abner Greenspan, MD  fluticasone (FLONASE) 50 MCG/ACT nasal spray PLACE 2 SPRAYS INTO THE NOSE DAILY AS NEEDED. 05/06/15  Abner Greenspan, MD  losartan-hydrochlorothiazide (HYZAAR) 100-25 MG per tablet Take 1 tablet by mouth daily. 10/12/14   Abner Greenspan, MD  LUMIGAN 0.01 % SOLN  10/12/14   Historical Provider, MD  metFORMIN (GLUCOPHAGE) 500 MG tablet Take 1 tablet (500 mg total) by mouth daily with breakfast. 10/07/14   Abner Greenspan, MD  metoprolol (LOPRESSOR) 100 MG tablet Take 1 tablet (100 mg total) by mouth 2 (two) times daily. 12/08/14   Abner Greenspan, MD  potassium chloride SA (KLOR-CON M20) 20 MEQ tablet Take 1 tablet (20 mEq total) by mouth daily. 10/07/14   Abner Greenspan, MD  rosuvastatin (CRESTOR) 20 MG tablet Take 1 tablet (20 mg  total) by mouth daily. 10/07/14   Wynelle Fanny Tower, MD   BP 168/54 mmHg  Pulse 80  Temp(Src) 98 F (36.7 C) (Oral)  Resp 18  SpO2 97%   Physical Exam  Constitutional: She is oriented to person, place, and time. She appears well-developed and well-nourished. No distress.  HENT:  Head: Normocephalic and atraumatic.  Mouth/Throat: Oropharynx is clear and moist.  No visible signs of head trauma; mid-face stable; dentition intact; no malocclusion or trismus  Eyes: Conjunctivae and EOM are normal. Pupils are equal, round, and reactive to light.  Neck: Normal range of motion. Neck supple.  Cardiovascular: Normal rate, regular rhythm and normal heart sounds.   Pulmonary/Chest: Effort normal and breath sounds normal. No respiratory distress. She has no wheezes.  Abdominal: Soft. Bowel sounds are normal. There is no tenderness. There is no guarding.  Musculoskeletal:       Right shoulder: She exhibits decreased range of motion, tenderness, bony tenderness and pain. She exhibits no spasm.  TTP of right lateral and anterior shoulder without noted deformity or swelling; limited ROM due to pain; strong radial pulse and cap refill; normal grip strength and sensation throughout arm  Neurological: She is alert and oriented to person, place, and time.  AAOx3, answering questions appropriately; equal strength UE and LE bilaterally; CN grossly intact; moves all extremities appropriately without ataxia; no focal neuro deficits or facial asymmetry appreciated  Skin: Skin is warm and dry. She is not diaphoretic.  Psychiatric: She has a normal mood and affect.  Nursing note and vitals reviewed.   ED Course  ORTHOPEDIC INJURY TREATMENT Date/Time: 06/03/2015 2:01 PM Performed by: Larene Pickett Authorized by: Larene Pickett Consent: Verbal consent obtained. Risks and benefits: risks, benefits and alternatives were discussed Consent given by: patient Patient understanding: patient states understanding of the  procedure being performed Patient identity confirmed: verbally with patient Injury location: shoulder Location details: right shoulder Injury type: soft tissue Pre-procedure neurovascular assessment: neurovascularly intact Immobilization: sling Post-procedure neurovascular assessment: post-procedure neurovascularly intact Patient tolerance: Patient tolerated the procedure well with no immediate complications   (including critical care time) Labs Review Labs Reviewed - No data to display  Imaging Review Dg Shoulder Right  06/03/2015   CLINICAL DATA:  Fall today.  Pain  EXAM: RIGHT SHOULDER - 2+ VIEW  COMPARISON:  None.  FINDINGS: Negative for fracture.  Normal alignment  Degenerative change in spurring in the [facility] joint. Mild degenerative change in the shoulder joint. No rib fracture.  IMPRESSION: Negative for acute fracture.   Electronically Signed   By: Franchot Gallo M.D.   On: 06/03/2015 12:39   I have personally reviewed and evaluated these images and lab results as part of my medical decision-making.   EKG Interpretation None  MDM   Final diagnoses:  Fall, initial encounter  Shoulder pain, right   79 year old female here for mechanical fall that occurred prior to arrival. She landed on her right shoulder. There was minor head injury without loss of consciousness. Patient is neurologically intact without any visible signs of head trauma. Right shoulder is tender to palpation along anterior and lateral aspect without noted deformity. X-ray was obtained which is negative for acute findings. Arm remains neurovascularly intact. Arm sling was applied for comfort, Rx Vicodin for pain. Patient was given orthopedic follow-up for any new or worsening symptoms. She may follow with her PCP otherwise.  Discussed plan with patient, he/she acknowledged understanding and agreed with plan of care.  Return precautions given for new or worsening symptoms.  Case discussed with attending  physician, Dr. Betsey Holiday, who evaluated patient and agrees with assessment and plan of care.  Larene Pickett, PA-C 06/03/15 [place], MD 06/04/15 [phone]

## 2015-06-03 NOTE — ED Provider Notes (Signed)
Patient presented to the ER with right shoulder injury. Patient reports that she tripped over an object walking and fell forward. She reports that she is experiencing pain in her right shoulder when she tries to move it. She does think she hit her head, but there was no loss of consciousness. No headache, neck pain, back pain.  Face to face Exam: HEENT - PERRLA Lungs - CTAB Heart - RRR, no M/R/G Abd - S/NT/ND Neuro - alert, oriented x3  Plan: No concern for intracranial injury. Cervical spine, back clinically cleared. X-ray shoulder to further evaluate.  Orpah Greek, MD 06/03/15 1228

## 2015-06-03 NOTE — ED Notes (Signed)
Pt sts right shoulder pain with movement after trip and fall today; denies LOC

## 2015-06-03 NOTE — Discharge Instructions (Signed)
Take the prescribed medication as directed if needed for pain.  Use caution-- medication may make you drowsy. Wear sling for comfort and progress back to normal movement as tolerated. Follow-up with your primary care physician. If no improvement in the next few days, may wish to follow-up with orthopedics. Return to the ED for new or worsening symptoms.

## 2015-06-04 ENCOUNTER — Other Ambulatory Visit: Payer: Self-pay

## 2015-06-04 DIAGNOSIS — Z1231 Encounter for screening mammogram for malignant neoplasm of breast: Secondary | ICD-10-CM

## 2015-06-08 ENCOUNTER — Telehealth: Payer: Self-pay | Admitting: Family Medicine

## 2015-06-08 DIAGNOSIS — E119 Type 2 diabetes mellitus without complications: Secondary | ICD-10-CM

## 2015-06-08 DIAGNOSIS — E559 Vitamin D deficiency, unspecified: Secondary | ICD-10-CM

## 2015-06-08 DIAGNOSIS — E7800 Pure hypercholesterolemia, unspecified: Secondary | ICD-10-CM

## 2015-06-08 DIAGNOSIS — I10 Essential (primary) hypertension: Secondary | ICD-10-CM

## 2015-06-08 DIAGNOSIS — D649 Anemia, unspecified: Secondary | ICD-10-CM

## 2015-06-08 NOTE — Telephone Encounter (Signed)
-----   Message from Ellamae Sia sent at 06/03/2015  9:59 AM EDT ----- Regarding: Lab orders for Wednesday, 10.5.16 Patient is scheduled for CPX labs, please order future labs, Thanks , Karna Christmas

## 2015-06-09 ENCOUNTER — Other Ambulatory Visit (INDEPENDENT_AMBULATORY_CARE_PROVIDER_SITE_OTHER): Payer: Medicare Other

## 2015-06-09 DIAGNOSIS — D649 Anemia, unspecified: Secondary | ICD-10-CM | POA: Diagnosis not present

## 2015-06-09 DIAGNOSIS — E119 Type 2 diabetes mellitus without complications: Secondary | ICD-10-CM

## 2015-06-09 DIAGNOSIS — I10 Essential (primary) hypertension: Secondary | ICD-10-CM | POA: Diagnosis not present

## 2015-06-09 DIAGNOSIS — E7800 Pure hypercholesterolemia, unspecified: Secondary | ICD-10-CM

## 2015-06-09 DIAGNOSIS — E559 Vitamin D deficiency, unspecified: Secondary | ICD-10-CM | POA: Diagnosis not present

## 2015-06-09 LAB — CBC WITH DIFFERENTIAL/PLATELET
BASOS ABS: 0.1 10*3/uL (ref 0.0–0.1)
BASOS PCT: 1 % (ref 0.0–3.0)
EOS ABS: 0.1 10*3/uL (ref 0.0–0.7)
Eosinophils Relative: 2.3 % (ref 0.0–5.0)
HEMATOCRIT: 35.1 % — AB (ref 36.0–46.0)
Hemoglobin: 11.1 g/dL — ABNORMAL LOW (ref 12.0–15.0)
LYMPHS ABS: 2.1 10*3/uL (ref 0.7–4.0)
Lymphocytes Relative: 36.9 % (ref 12.0–46.0)
MCHC: 31.6 g/dL (ref 30.0–36.0)
MCV: 95 fl (ref 78.0–100.0)
MONO ABS: 0.5 10*3/uL (ref 0.1–1.0)
Monocytes Relative: 9 % (ref 3.0–12.0)
NEUTROS ABS: 2.9 10*3/uL (ref 1.4–7.7)
NEUTROS PCT: 50.8 % (ref 43.0–77.0)
PLATELETS: 271 10*3/uL (ref 150.0–400.0)
RBC: 3.69 Mil/uL — ABNORMAL LOW (ref 3.87–5.11)
RDW: 13.9 % (ref 11.5–15.5)
WBC: 5.8 10*3/uL (ref 4.0–10.5)

## 2015-06-09 LAB — LIPID PANEL
CHOL/HDL RATIO: 2
CHOLESTEROL: 142 mg/dL (ref 0–200)
HDL: 58 mg/dL (ref >39.00)
LDL CALC: 69 mg/dL (ref 0–99)
NonHDL: 83.77
Triglycerides: 73 mg/dL (ref 0.0–149.0)
VLDL: 14.6 mg/dL (ref 0.0–40.0)

## 2015-06-09 LAB — COMPREHENSIVE METABOLIC PANEL
ALT: 9 U/L (ref 0–35)
AST: 18 U/L (ref 0–37)
Albumin: 4 g/dL (ref 3.5–5.2)
Alkaline Phosphatase: 54 U/L (ref 39–117)
BILIRUBIN TOTAL: 0.7 mg/dL (ref 0.2–1.2)
BUN: 15 mg/dL (ref 6–23)
CALCIUM: 9.7 mg/dL (ref 8.4–10.5)
CHLORIDE: 101 meq/L (ref 96–112)
CO2: 33 meq/L — AB (ref 19–32)
CREATININE: 0.94 mg/dL (ref 0.40–1.20)
GFR: 74.15 mL/min (ref >60.00)
GLUCOSE: 117 mg/dL — AB (ref 70–99)
Potassium: 3.6 mEq/L (ref 3.5–5.1)
SODIUM: 141 meq/L (ref 135–145)
Total Protein: 7.1 g/dL (ref 6.0–8.3)

## 2015-06-09 LAB — TSH: TSH: 0.94 u[IU]/mL (ref 0.35–4.50)

## 2015-06-09 LAB — VITAMIN D 25 HYDROXY (VIT D DEFICIENCY, FRACTURES): VITD: 34.39 ng/mL (ref 30.00–100.00)

## 2015-06-09 LAB — HEMOGLOBIN A1C: Hgb A1c MFr Bld: 6.6 % — ABNORMAL HIGH (ref 4.6–6.5)

## 2015-06-14 DIAGNOSIS — H401132 Primary open-angle glaucoma, bilateral, moderate stage: Secondary | ICD-10-CM | POA: Diagnosis not present

## 2015-06-14 LAB — HM DIABETES EYE EXAM

## 2015-06-15 ENCOUNTER — Encounter: Payer: Self-pay | Admitting: Family Medicine

## 2015-06-15 ENCOUNTER — Ambulatory Visit (INDEPENDENT_AMBULATORY_CARE_PROVIDER_SITE_OTHER): Payer: Medicare Other | Admitting: Family Medicine

## 2015-06-15 VITALS — BP 128/68 | HR 66 | Temp 97.4°F | Ht 60.5 in | Wt 145.5 lb

## 2015-06-15 DIAGNOSIS — Z0000 Encounter for general adult medical examination without abnormal findings: Secondary | ICD-10-CM | POA: Insufficient documentation

## 2015-06-15 DIAGNOSIS — E1169 Type 2 diabetes mellitus with other specified complication: Secondary | ICD-10-CM

## 2015-06-15 DIAGNOSIS — E119 Type 2 diabetes mellitus without complications: Secondary | ICD-10-CM | POA: Diagnosis not present

## 2015-06-15 DIAGNOSIS — M810 Age-related osteoporosis without current pathological fracture: Secondary | ICD-10-CM

## 2015-06-15 DIAGNOSIS — E559 Vitamin D deficiency, unspecified: Secondary | ICD-10-CM

## 2015-06-15 DIAGNOSIS — I10 Essential (primary) hypertension: Secondary | ICD-10-CM

## 2015-06-15 DIAGNOSIS — Z23 Encounter for immunization: Secondary | ICD-10-CM

## 2015-06-15 DIAGNOSIS — E2839 Other primary ovarian failure: Secondary | ICD-10-CM

## 2015-06-15 DIAGNOSIS — E785 Hyperlipidemia, unspecified: Secondary | ICD-10-CM

## 2015-06-15 MED ORDER — METFORMIN HCL 500 MG PO TABS: 500.0000 mg | ORAL_TABLET | Freq: Two times a day (BID) | ORAL | Status: DC

## 2015-06-15 NOTE — Progress Notes (Signed)
Pre visit review using our clinic review tool, if applicable. No additional management support is needed unless otherwise documented below in the visit note. 

## 2015-06-15 NOTE — Patient Instructions (Signed)
If your shoulder does not continue to improve- call and make an appointment with Dr Lorelei Pont in this office  Flu shot today  Get your mammogram as planned in November If you are interested in a shingles/zoster vaccine - call your insurance to check on coverage,( you should not get it within 1 month of other vaccines) , then call us for a prescription  for it to take to a pharmacy that gives the shot , or make a nurse visit to get it here depending on your coverage Stop at check out for ref for bone density test  Please work on an advance directive - see the blue handout I gave you

## 2015-06-15 NOTE — Progress Notes (Signed)
Subjective:    Patient ID: Kathy Cunningham, female    DOB: 1936-05-24, 79 y.o.   MRN: [id number]  HPI Here for annual medicare wellness visit as well as chronic/acute medical problems as well as annual preventative exam  I have personally reviewed the Medicare Annual Wellness questionnaire and have noted 1. The patient's medical and social history 2. Their use of alcohol, tobacco or illicit drugs 3. Their current medications and supplements 4. The patient's functional ability including ADL's, fall risks, home safety risks and hearing or visual             impairment. 5. Diet and physical activities 6. Evidence for depression or mood disorders  The patients weight, height, BMI have been recorded in the chart and visual acuity is per eye clinic.  I have made referrals, counseling and provided education to the patient based review of the above and I have provided the pt with a written personalized care plan for preventive services. Reviewed and updated provider list, see scanned forms.  See scanned forms.  Routine anticipatory guidance given to patient.  See health maintenance. Colon cancer screening had 2009 - diverticulosis -rec 10 year recall  Breast cancer screening mammogram 4/15 - is due - has appt nov 4th at the breast center  Self breast exam-no lumps or changes  Flu vaccine -wants to get today  Tetanus vaccine 1/11  Pneumovax -had both/is complete  Zoster vaccine ? If she can afford-will call about the cost  dexa-3/13 - had OP in hip , had did not break a bone , tried bisphosphenate and caused vomiting, she takes her ca and D and also tums for extra calcium , gets a fair amt of exercise and yard work, is interested in another dexa  D level improved - went up to 34.3 (was 21)  [place] 9/29 in a store - she did not lift her foot high enough to get up a stair/fell on her face / hurt her shoulder (xray in ED was ok) Gradually improved rom shoulder  Of note - she tends to shuffle her  feet and also was wearing flip flops  Does not tend to loose her balance (per pt), she takes norco and does not think that affects her  Advance directive does not have living will or POA (knows what they are -had to do brother's recently)  Cognitive function addressed- see scanned forms- and if abnormal then additional documentation follows.  No major issues / occ she forgets what she went to get   PMH and SH reviewed  Meds, vitals, and allergies reviewed.   ROS: See HPI.  Otherwise negative.    Wt is up 3 lb with bmi of 27 Diet is so /so - usually eats healthy (recently ate too much junk at a revival)-plans to get it off   bp is stable today  No cp or palpitations or headaches or edema  No side effects to medicines  BP Readings from Last 3 Encounters:  06/15/15 128/68  06/03/15 121/57  12/08/14 130/80     Hx of anemia from thalassemia Fairly stable Lab Results  Component Value Date   WBC 5.8 06/09/2015   HGB 11.1* 06/09/2015   HCT 35.1* 06/09/2015   MCV 95.0 06/09/2015   PLT 271.0 06/09/2015   No longer using est vaginal cream   DM2 Stable Lab Results  Component Value Date   HGBA1C 6.6* 06/09/2015  last time 6.5  opthy exam - yesterday -no retinopathy  Hyperlipidemia crestor and diet Lab Results  Component Value Date   CHOL 142 06/09/2015   CHOL 150 10/12/2014   CHOL 161 12/11/2012   Lab Results  Component Value Date   HDL 58.00 06/09/2015   HDL 61.40 10/12/2014   HDL 61.10 12/11/2012   Lab Results  Component Value Date   LDLCALC 69 06/09/2015   LDLCALC 76 10/12/2014   LDLCALC 85 12/11/2012   Lab Results  Component Value Date   TRIG 73.0 06/09/2015   TRIG 63.0 10/12/2014   TRIG 73.0 12/11/2012   Lab Results  Component Value Date   CHOLHDL 2 06/09/2015   CHOLHDL 2 10/12/2014   CHOLHDL 3 12/11/2012   No results found for: LDLDIRECT  crestor and diet   Patient Active Problem List   Diagnosis Date Noted  . Initial Medicare annual wellness  visit 06/15/2015  . Routine general medical examination at a health care facility 06/15/2015  . Breast cancer screening 12/17/2013  . [place] 06/13/2013  . Tachycardia 04/21/2013  . Other screening mammogram 04/24/2011  . Glaucoma 04/24/2011  . Vitamin D deficiency 04/24/2011  . ANXIETY, SITUATIONAL 10/20/2010  . HYPOKALEMIA 10/22/2009  . OTHER THALASSEMIA 06/10/2008  . Osteoporosis 05/01/2008  . Diabetes type 2, controlled ([place]) 07/17/2007  . HYPERCHOLESTEROLEMIA 07/17/2007  . Anemia, unspecified 07/17/2007  . Essential hypertension 07/17/2007  . PREMATURE VENTRICULAR CONTRACTIONS, FREQUENT 07/17/2007  . UTI'S, RECURRENT 07/17/2007   Past Medical History  Diagnosis Date  . Diabetes mellitus     type II  . Dizziness     or vertigo  . Hypertension   . Hyperlipidemia   . Anemia   . Glaucoma    Past Surgical History  Procedure Laterality Date  . Rotator cuff repair  01/2002  . Bladder papilloma  2006  . Vaginal hysterectomy     Social History  Substance Use Topics  . Smoking status: Never Smoker   . Smokeless tobacco: None  . Alcohol Use: No   Family History  Problem Relation Age of Onset  . Heart disease Mother     MI  . Heart attack Mother   . Hypertension Mother   . Heart disease Father     MI  . Heart attack Father   . Hypertension Father    Allergies  Allergen Reactions  . Atorvastatin     REACTION: cramps  . Risedronate Sodium     Vomiting   . Sulfonamide Derivatives     REACTION: rash   Current Outpatient Prescriptions on File Prior to Visit  Medication Sig Dispense Refill  . bimatoprost (LUMIGAN) 0.03 % ophthalmic solution Place 1 drop into both eyes at bedtime.      . brimonidine (ALPHAGAN P) 0.1 % SOLN Place 1 drop into the left eye 2 (two) times daily.      . calcium citrate (CALCITRATE - DOSED IN MG ELEMENTAL CALCIUM) 950 MG tablet Take 1 tablet by mouth daily.     . cholecalciferol (VITAMIN D) 1000 UNITS tablet Take 2,000 Units by mouth  daily.     Marland Kitchen estradiol (ESTRACE) 0.1 MG/GM vaginal cream Use small amount intravaginally twice weekly as directed. 42.5 g 1  . fluticasone (FLONASE) 50 MCG/ACT nasal spray PLACE 2 SPRAYS INTO THE NOSE DAILY AS NEEDED. 16 g 2  . HYDROcodone-acetaminophen (NORCO/VICODIN) 5-325 MG tablet Take 1 tablet by mouth every 4 (four) hours as needed. 15 tablet 0  . losartan-hydrochlorothiazide (HYZAAR) 100-25 MG per tablet Take 1 tablet by mouth daily. 90 tablet  3  . LUMIGAN 0.01 % SOLN   6  . metFORMIN (GLUCOPHAGE) 500 MG tablet Take 1 tablet (500 mg total) by mouth daily with breakfast. 60 tablet 11  . metoprolol (LOPRESSOR) 100 MG tablet Take 1 tablet (100 mg total) by mouth 2 (two) times daily. 180 tablet 3  . potassium chloride SA (KLOR-CON M20) 20 MEQ tablet Take 1 tablet (20 mEq total) by mouth daily. (Patient taking differently: Take 20 mEq by mouth every other day. ) 30 tablet 11  . rosuvastatin (CRESTOR) 20 MG tablet Take 1 tablet (20 mg total) by mouth daily. 30 tablet 1   No current facility-administered medications on file prior to visit.     Review of Systems Review of Systems  Constitutional: Negative for fever, appetite change, fatigue and unexpected weight change.  Eyes: Negative for pain and visual disturbance.  Respiratory: Negative for cough and shortness of breath.   Cardiovascular: Negative for cp or palpitations    Gastrointestinal: Negative for nausea, diarrhea and constipation.  Genitourinary: Negative for urgency and frequency.  Skin: Negative for pallor or rash   MSK pos for L shoulder pain  Neurological: Negative for weakness, light-headedness, numbness and headaches.  Hematological: Negative for adenopathy. Does not bruise/bleed easily.  Psychiatric/Behavioral: Negative for dysphoric mood. The patient is not nervous/anxious.         Objective:   Physical Exam  Constitutional: She appears well-developed and well-nourished. No distress.  overwt and well appearing     HENT:  Head: Normocephalic and atraumatic.  Right Ear: External ear normal.  Left Ear: External ear normal.  Mouth/Throat: Oropharynx is clear and moist.  Eyes: Conjunctivae and EOM are normal. Pupils are equal, round, and reactive to light. No scleral icterus.  Neck: Normal range of motion. Neck supple. No JVD present. Carotid bruit is not present. No thyromegaly present.  Cardiovascular: Normal rate, regular rhythm, normal heart sounds and intact distal pulses.  Exam reveals no gallop.   Pulmonary/Chest: Effort normal and breath sounds normal. No respiratory distress. She has no wheezes. She exhibits no tenderness.  Abdominal: Soft. Bowel sounds are normal. She exhibits no distension, no abdominal bruit and no mass. There is no tenderness.  Genitourinary: No breast swelling, tenderness, discharge or bleeding.  Breast exam: No mass, nodules, thickening, tenderness, bulging, retraction, inflamation, nipple discharge or skin changes noted.  No axillary or clavicular LA.      Musculoskeletal: She exhibits no edema or tenderness.  Limited rom both shoulders  No kyphosis   Lymphadenopathy:    She has no cervical adenopathy.  Neurological: She is alert. She has normal reflexes. No cranial nerve deficit. She exhibits normal muscle tone. Coordination normal.  Skin: Skin is warm and dry. No rash noted. No erythema. No pallor.  Psychiatric: She has a normal mood and affect.          Assessment & Plan:   Problem List Items Addressed This Visit      Cardiovascular and Mediastinum   Essential hypertension    bp in fair control at this time  BP Readings from Last 1 Encounters:  06/15/15 128/68   No changes needed Disc lifstyle change with low sodium diet and exercise  Labs reviewed         Endocrine   Diabetes type 2, controlled ([place])    Lab Results  Component Value Date   HGBA1C 6.6* 06/09/2015   This continues to be well controlled  opth exam utd  Enc low glycemic diet and  wt control F/u 6 mo       Relevant Medications   metFORMIN (GLUCOPHAGE) 500 MG tablet     Musculoskeletal and Integument   Osteoporosis    Intol of bisphosphenates  dexa scheduled   One fall/ no fx  D level improved  Disc need for calcium/ vitamin D/ wt bearing exercise and bone density test every 2 y to monitor Disc safety/ fracture risk in detail          Other   Estrogen deficiency   Relevant Orders   DG Bone Density   Hyperlipidemia associated with type 2 diabetes mellitus ([place])    Disc goals for lipids and reasons to control them Rev labs with pt Rev low sat fat diet in detail Well controlled with diet and crestor       Relevant Medications   metFORMIN (GLUCOPHAGE) 500 MG tablet   Initial Medicare annual wellness visit - Primary    Reviewed appropriate screening tests for age  Also reviewed health mt list, fam hx and immunization status , as well as social and family history   See HPI Labs rev Flu shot today  Get your mammogram as planned in November If you are interested in a shingles/zoster vaccine - call your insurance to check on coverage,( you should not get it within 1 month of other vaccines) , then call us for a prescription  for it to take to a pharmacy that gives the shot , or make a nurse visit to get it here depending on your coverage Stop at check out for ref for bone density test  Please work on an advance directive - see the blue handout I gave you      Routine general medical examination at a health care facility    Reviewed health habits including diet and exercise and skin cancer prevention Reviewed appropriate screening tests for age  Also reviewed health mt list, fam hx and immunization status , as well as social and family history   See HPI Labs rev Flu shot today  Get your mammogram as planned in November If you are interested in a shingles/zoster vaccine - call your insurance to check on coverage,( you should not get it within 1 month  of other vaccines) , then call us for a prescription  for it to take to a pharmacy that gives the shot , or make a nurse visit to get it here depending on your coverage Stop at check out for ref for bone density test  Please work on an advance directive - see the blue handout I gave you      Vitamin D deficiency    Improved Vitamin D level is therapeutic with current supplementation Disc importance of this to bone and overall health         Other Visit Diagnoses    Need for influenza vaccination        Relevant Orders    Flu Vaccine QUAD 36+ mos PF IM (Fluarix & Fluzone Quad PF) (Completed)

## 2015-06-17 NOTE — Assessment & Plan Note (Signed)
Improved Vitamin D level is therapeutic with current supplementation Disc importance of this to bone and overall health  

## 2015-06-17 NOTE — Assessment & Plan Note (Signed)
Reviewed health habits including diet and exercise and skin cancer prevention Reviewed appropriate screening tests for age  Also reviewed health mt list, fam hx and immunization status , as well as social and family history   See HPI Labs rev Flu shot today  Get your mammogram as planned in November If you are interested in a shingles/zoster vaccine - call your insurance to check on coverage,( you should not get it within 1 month of other vaccines) , then call us for a prescription  for it to take to a pharmacy that gives the shot , or make a nurse visit to get it here depending on your coverage Stop at check out for ref for bone density test  Please work on an advance directive - see the blue handout I gave you

## 2015-06-17 NOTE — Assessment & Plan Note (Signed)
Lab Results  Component Value Date   HGBA1C 6.6* 06/09/2015   This continues to be well controlled  opth exam utd  Enc low glycemic diet and wt control F/u 6 mo

## 2015-06-17 NOTE — Assessment & Plan Note (Signed)
bp in fair control at this time  BP Readings from Last 1 Encounters:  06/15/15 128/68   No changes needed Disc lifstyle change with low sodium diet and exercise  Labs reviewed

## 2015-06-17 NOTE — Assessment & Plan Note (Signed)
Reviewed appropriate screening tests for age  Also reviewed health mt list, fam hx and immunization status , as well as social and family history   See HPI Labs rev Flu shot today  Get your mammogram as planned in November If you are interested in a shingles/zoster vaccine - call your insurance to check on coverage,( you should not get it within 1 month of other vaccines) , then call us for a prescription  for it to take to a pharmacy that gives the shot , or make a nurse visit to get it here depending on your coverage Stop at check out for ref for bone density test  Please work on an advance directive - see the blue handout I gave you

## 2015-06-17 NOTE — Assessment & Plan Note (Signed)
Disc goals for lipids and reasons to control them Rev labs with pt Rev low sat fat diet in detail Well controlled with diet and crestor  

## 2015-06-17 NOTE — Assessment & Plan Note (Signed)
Intol of bisphosphenates  dexa scheduled   One fall/ no fx  D level improved  Disc need for calcium/ vitamin D/ wt bearing exercise and bone density test every 2 y to monitor Disc safety/ fracture risk in detail

## 2015-06-18 ENCOUNTER — Encounter: Payer: Self-pay | Admitting: Family Medicine

## 2015-06-22 ENCOUNTER — Ambulatory Visit (INDEPENDENT_AMBULATORY_CARE_PROVIDER_SITE_OTHER)
Admission: RE | Admit: 2015-06-22 | Discharge: 2015-06-22 | Disposition: A | Discharge status: Home / Self Care | Payer: Medicare Other | Source: Ambulatory Visit | Attending: Family Medicine | Admitting: Family Medicine

## 2015-06-22 DIAGNOSIS — E2839 Other primary ovarian failure: Secondary | ICD-10-CM

## 2015-06-22 LAB — HM DEXA SCAN

## 2015-06-30 ENCOUNTER — Encounter: Payer: Self-pay | Admitting: *Deleted

## 2015-07-09 ENCOUNTER — Ambulatory Visit
Admission: RE | Admit: 2015-07-09 | Discharge: 2015-07-09 | Disposition: A | Discharge status: Home / Self Care | Payer: Medicare Other | Source: Ambulatory Visit

## 2015-07-09 DIAGNOSIS — Z1231 Encounter for screening mammogram for malignant neoplasm of breast: Secondary | ICD-10-CM

## 2015-07-09 LAB — HM MAMMOGRAPHY: HM MAMMO: NORMAL

## 2015-07-14 ENCOUNTER — Encounter: Payer: Self-pay | Admitting: Family Medicine

## 2015-07-14 ENCOUNTER — Encounter: Payer: Self-pay | Admitting: *Deleted

## 2015-08-24 ENCOUNTER — Other Ambulatory Visit: Payer: Self-pay | Admitting: Family Medicine

## 2015-10-22 ENCOUNTER — Other Ambulatory Visit: Payer: Self-pay | Admitting: Family Medicine

## 2015-11-13 ENCOUNTER — Other Ambulatory Visit: Payer: Self-pay | Admitting: Family Medicine

## 2015-11-23 ENCOUNTER — Other Ambulatory Visit: Payer: Self-pay | Admitting: Family Medicine

## 2015-12-30 ENCOUNTER — Encounter: Payer: Self-pay | Admitting: Family Medicine

## 2015-12-30 ENCOUNTER — Ambulatory Visit (INDEPENDENT_AMBULATORY_CARE_PROVIDER_SITE_OTHER): Payer: Medicare Other | Admitting: Family Medicine

## 2015-12-30 VITALS — BP 150/80 | HR 81 | Temp 97.2°F | Ht 60.5 in | Wt 148.8 lb

## 2015-12-30 DIAGNOSIS — M7501 Adhesive capsulitis of right shoulder: Secondary | ICD-10-CM

## 2015-12-30 NOTE — Progress Notes (Signed)
Pre visit review using our clinic review tool, if applicable. No additional management support is needed unless otherwise documented below in the visit note. 

## 2015-12-30 NOTE — Progress Notes (Signed)
Dr. Frederico Hamman T. Kannon Baum, MD, [facility] [place], [address] Phone: [phone] Fax: [phone]  12/30/2015  Patient: Kathy Cunningham, MRN: [id number], DOB: April 17, 1936, 80 y.o.  Primary Physician:  Loura Pardon, MD   Chief Complaint  Patient presents with  . Shoulder Pain    fell about a month ago.    Subjective:   Kathy Cunningham is a 80 y.o. very pleasant female patient who presents with the following: shoulder pain  DOI 06/02/2016  The patient noted above presents with shoulder pain that has been ongoing for 7 months Fell face down in 05/2016 The patient denies neck pain or radicular symptoms. No shoulder blade pain Denies dislocation, subluxation, separation of the shoulder. The patient does complain of pain with flexion, abduction, and terminal motion.  Significant restriction of motion. she describes a deep ache around the shoulder, and sometimes it will wake the patient up at night.  Going into a store on [address], [employer] and stepped up and fell face down. Drove all the way to [place] - started blacking out then.  Medications Tried: tylenol, nsaids Ice or Heat: minimal help Tried PT: No  Prior shoulder Injury: RTC tear, managed conservatively per report by [facility] Prior surgery: Not to shoulder per patient Prior fracture: No  Past Medical History, Surgical History, Social History, Family History, Medications, and allergies reviewed and updated if relevant.   Patient Active Problem List   Diagnosis Date Noted  . Initial Medicare annual wellness visit 06/15/2015  . Routine general medical examination at a health care facility 06/15/2015  . Estrogen deficiency 06/15/2015  . Breast cancer screening 12/17/2013  . [place] 06/13/2013  . Tachycardia 04/21/2013  . Other screening mammogram 04/24/2011  . Glaucoma 04/24/2011  . Vitamin D deficiency 04/24/2011  . ANXIETY, SITUATIONAL  10/20/2010  . HYPOKALEMIA 10/22/2009  . OTHER THALASSEMIA 06/10/2008  . Osteoporosis 05/01/2008  . Diabetes type 2, controlled ([employer]) 07/17/2007  . Hyperlipidemia associated with type 2 diabetes mellitus ([place]) 07/17/2007  . Anemia, unspecified 07/17/2007  . Essential hypertension 07/17/2007  . PREMATURE VENTRICULAR CONTRACTIONS, FREQUENT 07/17/2007  . UTI'S, RECURRENT 07/17/2007    Past Medical History  Diagnosis Date  . Diabetes mellitus     type II  . Dizziness     or vertigo  . Hypertension   . Hyperlipidemia   . Anemia   . Glaucoma     Past Surgical History  Procedure Laterality Date  . Rotator cuff repair  01/2002  . Bladder papilloma  2006  . Vaginal hysterectomy      Social History   Social History  . Marital Status: Married    Spouse Name: N/A  . Number of Children: N/A  . Years of Education: N/A   Occupational History  . Not on file.   Social History Main Topics  . Smoking status: Never Smoker   . Smokeless tobacco: Not on file  . Alcohol Use: No  . Drug Use: No  . Sexual Activity: Not on file   Other Topics Concern  . Not on file   Social History Narrative    Family History  Problem Relation Age of Onset  . Heart disease Mother     MI  . Heart attack Mother   . Hypertension Mother   . Heart disease Father     MI  . Heart attack Father   . Hypertension Father   . Cancer Brother  pancreatic     Allergies  Allergen Reactions  . Atorvastatin     REACTION: cramps  . Risedronate Sodium     Vomiting   . Sulfonamide Derivatives     REACTION: rash    Medication list reviewed and updated in full in [place].  GEN: No fevers, chills. Nontoxic. Primarily MSK c/o today. MSK: Detailed in the HPI GI: tolerating PO intake without difficulty Neuro: No numbness, parasthesias, or tingling associated. Otherwise the pertinent positives of the ROS are noted above.    Objective:   Blood pressure 150/80, pulse 81, temperature 97.2  F (36.2 C), temperature source Oral, height 5' 0.5" (1.537 m), weight 148 lb 12.8 oz (67.495 kg), SpO2 98 %.   GEN: WDWN, NAD, Non-toxic, Alert & Oriented x 3 HEENT: Atraumatic, Normocephalic.  Ears and Nose: No external deformity. EXTR: No clubbing/cyanosis/edema NEURO: Normal gait.  PSYCH: Normally interactive. Conversant. Not depressed or anxious appearing.  Calm demeanor.   Shoulder: R and L Inspection: No muscle wasting or winging Ecchymosis/edema: neg  AC joint, scapula, clavicle: NT Cervical spine: NT, full ROM Spurling's: neg ABNORMAL SIDE TESTED: R UNLESS OTHERWISE NOTED, THE CONTRALATERAL SIDE HAS FULL RANGE OF MOTION. Abduction: 5/5, LIMITED TO 120 DEGREES Flexion: 5/5, LIMITED TO 120 DEGNO ROM  IR, lift-off: 5/5. TESTED AT 90 DEGREES OF ABDUCTION, LIMITED TO 0 DEGREES ER at neutral:  5/5, TESTED AT 90 DEGREES OF ABDUCTION, LIMITED TO 20 DEGREES AC crossover and compression: PAIN Drop Test: neg Empty Can: neg Supraspinatus insertion: NT Bicipital groove: NT ALL OTHER SPECIAL TESTING EQUIVOCAL GIVEN LOSS OF MOTION C5-T1 intact Sensation intact Grip 5/5    CLINICAL DATA: Fall today. Pain  EXAM: RIGHT SHOULDER - 2+ VIEW  COMPARISON: None.  FINDINGS: Negative for fracture. Normal alignment  Degenerative change in spurring in the [facility] joint. Mild degenerative change in the shoulder joint. No rib fracture.  IMPRESSION: Negative for acute fracture.   Electronically Signed  By: Franchot Gallo M.D.  On: 06/03/2015 12:39  Assessment and Plan:   Frozen shoulder syndrome, right - Plan: Ambulatory referral to Physical Therapy  >25 minutes spent in face to face time with patient, >50% spent in counselling or coordination of care  Patient was given a systematic ROM protocol from [employer] to be done daily. Emphasized importance of adherence, help of PT, daily HEP.  The average length of total symptoms is 12-18 months going through 3 different phases  in the freezing and thawing process. Reviewed all with patient.   Tylenol or NSAID of choice prn for pain relief Intraarticular shoulder injections discussed with patient, which have good evidence for accelerating the thawing phase, but she declined for now.  Patient will be sent for formal PT for aggressive frozen shoulder ROM. Will need RTC str and scapular stabilization to fix underlying mechanics.  Follow-up: 6 weeks  Orders Placed This Encounter  Procedures  . Ambulatory referral to Physical Therapy    Signed,  Frederico Hamman T. Noni Stonesifer, MD   Patient's Medications  New Prescriptions   No medications on file  Previous Medications   BIMATOPROST (LUMIGAN) 0.03 % OPHTHALMIC SOLUTION    Place 1 drop into both eyes at bedtime.     BRIMONIDINE (ALPHAGAN P) 0.1 % SOLN    Place 1 drop into the left eye 2 (two) times daily.     CALCIUM CITRATE (CALCITRATE - DOSED IN MG ELEMENTAL CALCIUM) 950 MG TABLET    Take 1 tablet by mouth daily.    CHOLECALCIFEROL (VITAMIN  D) 1000 UNITS TABLET    Take 2,000 Units by mouth daily.    CRESTOR 20 MG TABLET    Take 1 tablet by mouth  daily   FLUTICASONE (FLONASE) 50 MCG/ACT NASAL SPRAY    PLACE 2 SPRAYS INTO THE NOSE DAILY AS NEEDED.   HYDROCODONE-ACETAMINOPHEN (NORCO/VICODIN) 5-325 MG TABLET    Take 1 tablet by mouth every 4 (four) hours as needed.   LOSARTAN-HYDROCHLOROTHIAZIDE (HYZAAR) 100-25 MG TABLET    Take 1 tablet by mouth  daily   LUMIGAN 0.01 % SOLN       METFORMIN (GLUCOPHAGE) 500 MG TABLET    Take 1 tablet (500 mg total) by mouth 2 (two) times daily with a meal.   METOPROLOL (LOPRESSOR) 100 MG TABLET    Take 1 tablet by mouth two  times daily   POTASSIUM CHLORIDE SA (K-DUR,KLOR-CON) 20 MEQ TABLET    Take 1 tablet by mouth  daily  Modified Medications   No medications on file  Discontinued Medications   No medications on file

## 2015-12-30 NOTE — Patient Instructions (Signed)

## 2015-12-31 DIAGNOSIS — M25611 Stiffness of right shoulder, not elsewhere classified: Secondary | ICD-10-CM | POA: Diagnosis not present

## 2015-12-31 DIAGNOSIS — M25511 Pain in right shoulder: Secondary | ICD-10-CM | POA: Diagnosis not present

## 2015-12-31 DIAGNOSIS — M7501 Adhesive capsulitis of right shoulder: Secondary | ICD-10-CM | POA: Diagnosis not present

## 2016-01-07 DIAGNOSIS — M7501 Adhesive capsulitis of right shoulder: Secondary | ICD-10-CM | POA: Diagnosis not present

## 2016-01-07 DIAGNOSIS — M25611 Stiffness of right shoulder, not elsewhere classified: Secondary | ICD-10-CM | POA: Diagnosis not present

## 2016-01-07 DIAGNOSIS — M25511 Pain in right shoulder: Secondary | ICD-10-CM | POA: Diagnosis not present

## 2016-01-10 DIAGNOSIS — M7501 Adhesive capsulitis of right shoulder: Secondary | ICD-10-CM | POA: Diagnosis not present

## 2016-01-10 DIAGNOSIS — M25511 Pain in right shoulder: Secondary | ICD-10-CM | POA: Diagnosis not present

## 2016-01-10 DIAGNOSIS — M25611 Stiffness of right shoulder, not elsewhere classified: Secondary | ICD-10-CM | POA: Diagnosis not present

## 2016-01-14 DIAGNOSIS — M25511 Pain in right shoulder: Secondary | ICD-10-CM | POA: Diagnosis not present

## 2016-01-14 DIAGNOSIS — M7501 Adhesive capsulitis of right shoulder: Secondary | ICD-10-CM | POA: Diagnosis not present

## 2016-01-14 DIAGNOSIS — M25611 Stiffness of right shoulder, not elsewhere classified: Secondary | ICD-10-CM | POA: Diagnosis not present

## 2016-01-17 DIAGNOSIS — M25611 Stiffness of right shoulder, not elsewhere classified: Secondary | ICD-10-CM | POA: Diagnosis not present

## 2016-01-17 DIAGNOSIS — M25511 Pain in right shoulder: Secondary | ICD-10-CM | POA: Diagnosis not present

## 2016-01-17 DIAGNOSIS — M7501 Adhesive capsulitis of right shoulder: Secondary | ICD-10-CM | POA: Diagnosis not present

## 2016-01-19 DIAGNOSIS — M7501 Adhesive capsulitis of right shoulder: Secondary | ICD-10-CM | POA: Diagnosis not present

## 2016-01-19 DIAGNOSIS — M25611 Stiffness of right shoulder, not elsewhere classified: Secondary | ICD-10-CM | POA: Diagnosis not present

## 2016-01-19 DIAGNOSIS — M25511 Pain in right shoulder: Secondary | ICD-10-CM | POA: Diagnosis not present

## 2016-01-24 DIAGNOSIS — M7501 Adhesive capsulitis of right shoulder: Secondary | ICD-10-CM | POA: Diagnosis not present

## 2016-01-24 DIAGNOSIS — M25511 Pain in right shoulder: Secondary | ICD-10-CM | POA: Diagnosis not present

## 2016-01-24 DIAGNOSIS — M25611 Stiffness of right shoulder, not elsewhere classified: Secondary | ICD-10-CM | POA: Diagnosis not present

## 2016-01-27 DIAGNOSIS — M7501 Adhesive capsulitis of right shoulder: Secondary | ICD-10-CM | POA: Diagnosis not present

## 2016-01-27 DIAGNOSIS — M25611 Stiffness of right shoulder, not elsewhere classified: Secondary | ICD-10-CM | POA: Diagnosis not present

## 2016-01-27 DIAGNOSIS — M25511 Pain in right shoulder: Secondary | ICD-10-CM | POA: Diagnosis not present

## 2016-02-20 ENCOUNTER — Other Ambulatory Visit: Payer: Self-pay | Admitting: Family Medicine

## 2016-04-07 ENCOUNTER — Other Ambulatory Visit: Payer: Self-pay | Admitting: Pharmacist

## 2016-04-07 NOTE — Patient Outreach (Signed)
Outreach call to Kathy Cunningham regarding her request for follow up from the [facility] Medication Adherence Campaign. Left a HIPAA compliant message on the patient's voicemail.   Harlow Asa, PharmD Clinical Pharmacist [facility] [phone]

## 2016-04-09 ENCOUNTER — Other Ambulatory Visit: Payer: Self-pay | Admitting: Family Medicine

## 2016-04-10 NOTE — Telephone Encounter (Signed)
Please schedule f/u in 6 mo with lab prior  30 min appt Refill until then

## 2016-04-10 NOTE — Telephone Encounter (Signed)
Pt has had an acute appt with Dr. Lorelei Pont but no recent/future appts. with you please advise

## 2016-04-10 NOTE — Telephone Encounter (Signed)
Left voicemail requesting pt to call the office back 

## 2016-04-10 NOTE — Telephone Encounter (Signed)
appt scheduled and med refilled 

## 2016-05-30 ENCOUNTER — Other Ambulatory Visit: Payer: Self-pay | Admitting: Family Medicine

## 2016-07-12 ENCOUNTER — Other Ambulatory Visit: Payer: Self-pay | Admitting: Family Medicine

## 2016-07-12 DIAGNOSIS — Z1231 Encounter for screening mammogram for malignant neoplasm of breast: Secondary | ICD-10-CM

## 2016-07-19 ENCOUNTER — Other Ambulatory Visit: Payer: Self-pay | Admitting: Family Medicine

## 2016-07-26 ENCOUNTER — Ambulatory Visit
Admission: RE | Admit: 2016-07-26 | Discharge: 2016-07-26 | Disposition: A | Discharge status: Home / Self Care | Payer: Medicare Other | Source: Ambulatory Visit | Attending: Family Medicine | Admitting: Family Medicine

## 2016-07-26 DIAGNOSIS — Z1231 Encounter for screening mammogram for malignant neoplasm of breast: Secondary | ICD-10-CM

## 2016-07-26 LAB — HM MAMMOGRAPHY

## 2016-08-01 ENCOUNTER — Encounter: Payer: Self-pay | Admitting: *Deleted

## 2016-08-02 DIAGNOSIS — H2512 Age-related nuclear cataract, left eye: Secondary | ICD-10-CM | POA: Diagnosis not present

## 2016-08-02 LAB — HM DIABETES EYE EXAM

## 2016-09-20 ENCOUNTER — Other Ambulatory Visit: Payer: Self-pay | Admitting: Family Medicine

## 2016-09-23 ENCOUNTER — Other Ambulatory Visit: Payer: Self-pay | Admitting: Family Medicine

## 2016-09-29 ENCOUNTER — Ambulatory Visit (INDEPENDENT_AMBULATORY_CARE_PROVIDER_SITE_OTHER): Payer: Medicare Other

## 2016-09-29 ENCOUNTER — Encounter (INDEPENDENT_AMBULATORY_CARE_PROVIDER_SITE_OTHER): Payer: Self-pay

## 2016-09-29 DIAGNOSIS — Z23 Encounter for immunization: Secondary | ICD-10-CM | POA: Diagnosis not present

## 2016-10-01 ENCOUNTER — Telehealth: Payer: Self-pay | Admitting: Family Medicine

## 2016-10-01 DIAGNOSIS — E119 Type 2 diabetes mellitus without complications: Secondary | ICD-10-CM

## 2016-10-01 DIAGNOSIS — I10 Essential (primary) hypertension: Secondary | ICD-10-CM

## 2016-10-01 DIAGNOSIS — E1169 Type 2 diabetes mellitus with other specified complication: Secondary | ICD-10-CM

## 2016-10-01 DIAGNOSIS — E785 Hyperlipidemia, unspecified: Secondary | ICD-10-CM

## 2016-10-01 DIAGNOSIS — E559 Vitamin D deficiency, unspecified: Secondary | ICD-10-CM

## 2016-10-01 NOTE — Telephone Encounter (Signed)
-----   Message from Ellamae Sia sent at 09/26/2016  4:13 PM EST ----- Regarding: lab orders for Friday,2.2.18 Lab orders for f/u

## 2016-10-06 ENCOUNTER — Other Ambulatory Visit: Payer: Self-pay

## 2016-10-10 ENCOUNTER — Other Ambulatory Visit (INDEPENDENT_AMBULATORY_CARE_PROVIDER_SITE_OTHER): Payer: Medicare Other

## 2016-10-10 DIAGNOSIS — E119 Type 2 diabetes mellitus without complications: Secondary | ICD-10-CM | POA: Diagnosis not present

## 2016-10-10 DIAGNOSIS — E1169 Type 2 diabetes mellitus with other specified complication: Secondary | ICD-10-CM | POA: Diagnosis not present

## 2016-10-10 DIAGNOSIS — I10 Essential (primary) hypertension: Secondary | ICD-10-CM | POA: Diagnosis not present

## 2016-10-10 DIAGNOSIS — E785 Hyperlipidemia, unspecified: Secondary | ICD-10-CM

## 2016-10-10 DIAGNOSIS — E559 Vitamin D deficiency, unspecified: Secondary | ICD-10-CM

## 2016-10-10 LAB — CBC WITH DIFFERENTIAL/PLATELET
BASOS ABS: 0.1 10*3/uL (ref 0.0–0.1)
Basophils Relative: 1 % (ref 0.0–3.0)
EOS ABS: 0.1 10*3/uL (ref 0.0–0.7)
EOS PCT: 2.9 % (ref 0.0–5.0)
HCT: 33.6 % — ABNORMAL LOW (ref 36.0–46.0)
Hemoglobin: 11.2 g/dL — ABNORMAL LOW (ref 12.0–15.0)
LYMPHS ABS: 1.7 10*3/uL (ref 0.7–4.0)
Lymphocytes Relative: 35.3 % (ref 12.0–46.0)
MCHC: 33.3 g/dL (ref 30.0–36.0)
MCV: 93.5 fl (ref 78.0–100.0)
MONO ABS: 0.5 10*3/uL (ref 0.1–1.0)
Monocytes Relative: 10.4 % (ref 3.0–12.0)
NEUTROS PCT: 50.4 % (ref 43.0–77.0)
Neutro Abs: 2.5 10*3/uL (ref 1.4–7.7)
Platelets: 284 10*3/uL (ref 150.0–400.0)
RBC: 3.59 Mil/uL — AB (ref 3.87–5.11)
RDW: 13.5 % (ref 11.5–15.5)
WBC: 4.9 10*3/uL (ref 4.0–10.5)

## 2016-10-10 LAB — COMPREHENSIVE METABOLIC PANEL
ALK PHOS: 54 U/L (ref 39–117)
ALT: 8 U/L (ref 0–35)
AST: 14 U/L (ref 0–37)
Albumin: 4.1 g/dL (ref 3.5–5.2)
BUN: 19 mg/dL (ref 6–23)
CO2: 32 mEq/L (ref 19–32)
Calcium: 9.7 mg/dL (ref 8.4–10.5)
Chloride: 100 mEq/L (ref 96–112)
Creatinine, Ser: 0.9 mg/dL (ref 0.40–1.20)
GFR: 77.69 mL/min (ref >60.00)
GLUCOSE: 124 mg/dL — AB (ref 70–99)
POTASSIUM: 4.2 meq/L (ref 3.5–5.1)
SODIUM: 139 meq/L (ref 135–145)
TOTAL PROTEIN: 6.8 g/dL (ref 6.0–8.3)
Total Bilirubin: 0.7 mg/dL (ref 0.2–1.2)

## 2016-10-10 LAB — LIPID PANEL
CHOL/HDL RATIO: 3
Cholesterol: 166 mg/dL (ref 0–200)
HDL: 56.3 mg/dL (ref >39.00)
LDL Cholesterol: 94 mg/dL (ref 0–99)
NONHDL: 109.39
Triglycerides: 78 mg/dL (ref 0.0–149.0)
VLDL: 15.6 mg/dL (ref 0.0–40.0)

## 2016-10-10 LAB — HEMOGLOBIN A1C: Hgb A1c MFr Bld: 6.6 % — ABNORMAL HIGH (ref 4.6–6.5)

## 2016-10-10 LAB — TSH: TSH: 0.81 u[IU]/mL (ref 0.35–4.50)

## 2016-10-10 LAB — VITAMIN D 25 HYDROXY (VIT D DEFICIENCY, FRACTURES): VITD: 35.56 ng/mL (ref 30.00–100.00)

## 2016-10-11 ENCOUNTER — Ambulatory Visit (INDEPENDENT_AMBULATORY_CARE_PROVIDER_SITE_OTHER): Payer: Medicare Other

## 2016-10-11 VITALS — BP 154/80 | HR 67 | Temp 97.6°F | Ht 60.25 in | Wt 143.5 lb

## 2016-10-11 DIAGNOSIS — Z0000 Encounter for general adult medical examination without abnormal findings: Secondary | ICD-10-CM | POA: Diagnosis not present

## 2016-10-11 NOTE — Progress Notes (Signed)
Pre visit review using our clinic review tool, if applicable. No additional management support is needed unless otherwise documented below in the visit note. 

## 2016-10-11 NOTE — Progress Notes (Signed)
PCP notes:   Health maintenance:  Foot exam - PCP will complete at next appt  Abnormal screenings:   Hearing - failed Fall risk - accidental fall with injury Mini-Cog score: 19/20  Patient concerns:   Pt states she has been experiencing sinus drainage and feels fullness in right ear. Pt states she has increased phlegm that has been clear in color.   Pt has concerns with insomnia. Pt states she is worried someone may break in her home at night. Pt denied ever being the victim of a violent act or crime.   Nurse concerns:  Pt's BP was elevated. 154/80. Pt stated she does experience intermittent dizziness. Pt denied blurred vision or headaches.   Next PCP appt:   10/13/16 @ 0900  I reviewed health advisor's note, was available for consultation, and agree with documentation and plan. Loura Pardon MD

## 2016-10-11 NOTE — Patient Instructions (Signed)
Kathy Cunningham , Thank you for taking time to come for your Medicare Wellness Visit. I appreciate your ongoing commitment to your health goals. Please review the following plan we discussed and let me know if I can assist you in the future.   These are the goals we discussed: Goals    . Increase physical activity          Starting 10/11/2016, I will continue to exercise at least 30 min 3 days per week.        This is a list of the screening recommended for you and due dates:  Health Maintenance  Topic Date Due  . Complete foot exam   06/13/2017*  . Shingles Vaccine  10/06/2020*  . Hemoglobin A1C  04/09/2017  . Mammogram  07/26/2017  . Eye exam for diabetics  08/02/2017  . Tetanus Vaccine  10/05/2019  . Flu Shot  Completed  . DEXA scan (bone density measurement)  Completed  . Pneumonia vaccines  Completed  *Topic was postponed. The date shown is not the original due date.   Preventive Care for Adults  A healthy lifestyle and preventive care can promote health and wellness. Preventive health guidelines for adults include the following key practices.  . A routine yearly physical is a good way to check with your health care provider about your health and preventive screening. It is a chance to share any concerns and updates on your health and to receive a thorough exam.  . Visit your dentist for a routine exam and preventive care every 6 months. Brush your teeth twice a day and floss once a day. Good oral hygiene prevents tooth decay and gum disease.  . The frequency of eye exams is based on your age, health, family medical history, use  of contact lenses, and other factors. Follow your health care provider's ecommendations for frequency of eye exams.  . Eat a healthy diet. Foods like vegetables, fruits, whole grains, low-fat dairy products, and lean protein foods contain the nutrients you need without too many calories. Decrease your intake of foods high in solid fats, added sugars, and  salt. Eat the right amount of calories for you. Get information about a proper diet from your health care provider, if necessary.  . Regular physical exercise is one of the most important things you can do for your health. Most adults should get at least 150 minutes of moderate-intensity exercise (any activity that increases your heart rate and causes you to sweat) each week. In addition, most adults need muscle-strengthening exercises on 2 or more days a week.  Silver Sneakers may be a benefit available to you. To determine eligibility, you may visit the website: www.silversneakers.com or contact program at (478)759-8128 Mon-Fri between 8AM-8PM.   . Maintain a healthy weight. The body mass index (BMI) is a screening tool to identify possible weight problems. It provides an estimate of body fat based on height and weight. Your health care provider can find your BMI and can help you achieve or maintain a healthy weight.   For adults 20 years and older: ? A BMI below 18.5 is considered underweight. ? A BMI of 18.5 to 24.9 is normal. ? A BMI of 25 to 29.9 is considered overweight. ? A BMI of 30 and above is considered obese.   . Maintain normal blood lipids and cholesterol levels by exercising and minimizing your intake of saturated fat. Eat a balanced diet with plenty of fruit and vegetables. Blood tests for lipids and  cholesterol should begin at age 71 and be repeated every 5 years. If your lipid or cholesterol levels are high, you are over 50, or you are at high risk for heart disease, you may need your cholesterol levels checked more frequently. Ongoing high lipid and cholesterol levels should be treated with medicines if diet and exercise are not working.  . If you smoke, find out from your health care provider how to quit. If you do not use tobacco, please do not start.  . If you choose to drink alcohol, please do not consume more than 2 drinks per day. One drink is considered to be 12 ounces  (355 mL) of beer, 5 ounces (148 mL) of wine, or 1.5 ounces (44 mL) of liquor.  . If you are 68-48 years old, ask your health care provider if you should take aspirin to prevent strokes.  . Use sunscreen. Apply sunscreen liberally and repeatedly throughout the day. You should seek shade when your shadow is shorter than you. Protect yourself by wearing long sleeves, pants, a wide-brimmed hat, and sunglasses year round, whenever you are outdoors.  . Once a month, do a whole body skin exam, using a mirror to look at the skin on your back. Tell your health care provider of new moles, moles that have irregular borders, moles that are larger than a pencil eraser, or moles that have changed in shape or color.

## 2016-10-11 NOTE — Progress Notes (Signed)
Subjective:   Kathy Cunningham is a 81 y.o. female who presents for Medicare Annual (Subsequent) preventive examination.  Review of Systems:  N/A Cardiac Risk Factors include: advanced age (>44men, >30 women);diabetes mellitus;dyslipidemia;hypertension     Objective:     Vitals: BP (!) 154/80 (BP Location: Right Arm, Patient Position: Sitting, Cuff Size: Normal)   Pulse 67   Temp 97.6 F (36.4 C) (Oral)   Ht 5' 0.25" (1.53 m) Comment: no shoes  Wt 143 lb 8 oz (65.1 kg)   SpO2 100%   BMI 27.79 kg/m   Body mass index is 27.79 kg/m.   Tobacco History  Smoking Status  . Never Smoker  Smokeless Tobacco  . Never Used     Counseling given: No   Past Medical History:  Diagnosis Date  . Anemia   . Diabetes mellitus    type II  . Dizziness    or vertigo  . Glaucoma   . Hyperlipidemia   . Hypertension    Past Surgical History:  Procedure Laterality Date  . bladder papilloma  2006  . ROTATOR CUFF REPAIR  01/2002  . vaginal hysterectomy     Family History  Problem Relation Age of Onset  . Heart disease Mother     MI  . Heart attack Mother   . Hypertension Mother   . Heart disease Father     MI  . Heart attack Father   . Hypertension Father   . Cancer Brother     pancreatic    History  Sexual Activity  . Sexual activity: No    Outpatient Encounter Prescriptions as of 10/11/2016  Medication Sig  . bimatoprost (LUMIGAN) 0.03 % ophthalmic solution Place 1 drop into both eyes at bedtime.    . brimonidine (ALPHAGAN P) 0.1 % SOLN Place 1 drop into the left eye 2 (two) times daily.    . calcium citrate (CALCITRATE - DOSED IN MG ELEMENTAL CALCIUM) 950 MG tablet Take 1 tablet by mouth daily.   . cholecalciferol (VITAMIN D) 1000 UNITS tablet Take 2,000 Units by mouth daily.   . CRESTOR 20 MG tablet Take 1 tablet by mouth  daily  . fluticasone (FLONASE) 50 MCG/ACT nasal spray PLACE 2 SPRAYS INTO THE NOSE DAILY AS NEEDED.  Marland Kitchen losartan-hydrochlorothiazide (HYZAAR)  100-25 MG tablet TAKE 1 TABLET BY MOUTH  DAILY  . metFORMIN (GLUCOPHAGE) 500 MG tablet TAKE 1 TABLET BY MOUTH TWO  TIMES DAILY WITH MEALS  . metoprolol (LOPRESSOR) 100 MG tablet TAKE 1 TABLET BY MOUTH TWO  TIMES DAILY  . potassium chloride SA (K-DUR,KLOR-CON) 20 MEQ tablet Take 1 tablet by mouth  daily  . [DISCONTINUED] HYDROcodone-acetaminophen (NORCO/VICODIN) 5-325 MG tablet Take 1 tablet by mouth every 4 (four) hours as needed.  . [DISCONTINUED] LUMIGAN 0.01 % SOLN    No facility-administered encounter medications on file as of 10/11/2016.     Activities of Daily Living In your present state of health, do you have any difficulty performing the following activities: 10/11/2016  Hearing? Y  Vision? N  Difficulty concentrating or making decisions? N  Walking or climbing stairs? N  Dressing or bathing? N  Doing errands, shopping? N  Preparing Food and eating ? N  Using the Toilet? N  In the past six months, have you accidently leaked urine? Y  Do you have problems with loss of bowel control? N  Managing your Medications? N  Managing your Finances? N  Housekeeping or managing your Housekeeping? N  Some  recent data might be hidden    Patient Care Team: Abner Greenspan, MD as PCP - General Birder Robson, MD as Referring Physician (Ophthalmology)    Assessment:     Hearing Screening   125Hz  250Hz  500Hz  1000Hz  2000Hz  3000Hz  4000Hz  6000Hz  8000Hz   Right ear:   40 0 0  0    Left ear:   0 0 40  0    Vision Screening Comments: Last vision exam in Nov 2017 with Dr. George Ina   Exercise Activities and Dietary recommendations Current Exercise Habits: Home exercise routine, Type of exercise: walking;stretching;Other - see comments (floor exercises), Time (Minutes): 30, Frequency (Times/Week): 3, Weekly Exercise (Minutes/Week): 90, Exercise limited by: None identified  Goals    . Increase physical activity          Starting 10/11/2016, I will continue to exercise at least 30 min 3 days per  week.       Fall Risk Fall Risk  10/11/2016 06/15/2015  Falls in the past year? Yes Yes  Number falls in past yr: 1 1  Injury with Fall? Yes Yes   Depression Screen PHQ 2/9 Scores 10/11/2016 06/15/2015  PHQ - 2 Score 0 0     Cognitive Function MMSE - Mini Mental State Exam 10/11/2016  Orientation to time 5  Orientation to Place 5  Registration 3  Attention/ Calculation 0  Recall 2 pt was unable to recall 1 of 3 words  Language- name 2 objects 0  Language- repeat 1  Language- follow 3 step command 3  Language- read & follow direction 0  Write a sentence 0  Copy design 0  Total score 19     PLEASE NOTE: A Mini-Cog screen was completed. Maximum score is 20. A value of 0 denotes this part of [place] MMSE was not completed or the patient failed this part of the Mini-Cog screening.   Mini-Cog Screening Orientation to Time - Max 5 pts Orientation to Place - Max 5 pts Registration - Max 3 pts Recall - Max 3 pts Language Repeat - Max 1 pts Language Follow 3 Step Command - Max 3 pts     Immunization History  Administered Date(s) Administered  . Influenza,inj,Quad PF,36+ Mos 06/13/2013, 09/11/2014, 06/15/2015, 09/29/2016  . Pneumococcal Conjugate-13 09/11/2014  . Pneumococcal Polysaccharide-23 06/13/2013  . Td 09/04/1993, 10/04/2009   Screening Tests Health Maintenance  Topic Date Due  . FOOT EXAM  06/13/2017 (Originally 06/14/2016)  . ZOSTAVAX  10/06/2020 (Originally 11/09/1997)  . HEMOGLOBIN A1C  04/09/2017  . MAMMOGRAM  07/26/2017  . OPHTHALMOLOGY EXAM  08/02/2017  . TETANUS/TDAP  10/05/2019  . INFLUENZA VACCINE  Completed  . DEXA SCAN  Completed  . PNA vac Low Risk Adult  Completed      Plan:     I have personally reviewed and addressed the Medicare Annual Wellness questionnaire and have noted the following in the patient's chart:  A. Medical and social history B. Use of alcohol, tobacco or illicit drugs  C. Current medications and supplements D. Functional  ability and status E.  Nutritional status F.  Physical activity G. Advance directives H. List of other physicians I.  Hospitalizations, surgeries, and ER visits in previous 12 months J.  [place] to include hearing, vision, cognitive, depression L. Referrals and appointments - none  In addition, I have reviewed and discussed with patient certain preventive protocols, quality metrics, and best practice recommendations. A written personalized care plan for preventive services as well as general preventive  health recommendations were provided to patient.  See attached scanned questionnaire for additional information.   Signed,   Lindell Noe, MHA, BS, LPN Health Coach

## 2016-10-13 ENCOUNTER — Ambulatory Visit (INDEPENDENT_AMBULATORY_CARE_PROVIDER_SITE_OTHER): Payer: Medicare Other | Admitting: Family Medicine

## 2016-10-13 ENCOUNTER — Encounter: Payer: Self-pay | Admitting: Family Medicine

## 2016-10-13 VITALS — BP 140/68 | HR 84 | Temp 99.1°F | Ht 60.25 in | Wt 143.5 lb

## 2016-10-13 DIAGNOSIS — J111 Influenza due to unidentified influenza virus with other respiratory manifestations: Secondary | ICD-10-CM | POA: Diagnosis not present

## 2016-10-13 DIAGNOSIS — E119 Type 2 diabetes mellitus without complications: Secondary | ICD-10-CM

## 2016-10-13 DIAGNOSIS — I10 Essential (primary) hypertension: Secondary | ICD-10-CM

## 2016-10-13 DIAGNOSIS — E785 Hyperlipidemia, unspecified: Secondary | ICD-10-CM

## 2016-10-13 DIAGNOSIS — E559 Vitamin D deficiency, unspecified: Secondary | ICD-10-CM

## 2016-10-13 DIAGNOSIS — E1169 Type 2 diabetes mellitus with other specified complication: Secondary | ICD-10-CM | POA: Diagnosis not present

## 2016-10-13 DIAGNOSIS — F419 Anxiety disorder, unspecified: Secondary | ICD-10-CM

## 2016-10-13 DIAGNOSIS — D649 Anemia, unspecified: Secondary | ICD-10-CM

## 2016-10-13 DIAGNOSIS — D568 Other thalassemias: Secondary | ICD-10-CM

## 2016-10-13 MED ORDER — OSELTAMIVIR PHOSPHATE 75 MG PO CAPS: 75.0000 mg | ORAL_CAPSULE | Freq: Two times a day (BID) | ORAL | 0 refills | Status: DC

## 2016-10-13 MED ORDER — BENZONATATE 200 MG PO CAPS: 200.0000 mg | ORAL_CAPSULE | Freq: Three times a day (TID) | ORAL | 0 refills | Status: DC | PRN

## 2016-10-13 NOTE — Patient Instructions (Addendum)
I think you have the flu  Drink fluids and rest  Take acetaminophen or ibuprofen (with food)  for fever and body aches  Wear a mask if you are in public  Take the tamiflu as directed  Tessalon for cough as needed  You can also get mucinex DM over the counter for congestion and cough  Update if not starting to improve in a week or if worsening     Labs are all stable     Influenza, Adult Influenza, more commonly known as "the flu," is a viral infection that primarily affects the respiratory tract. The respiratory tract includes organs that help you breathe, such as the lungs, nose, and throat. The flu causes many common cold symptoms, as well as a high fever and body aches. The flu spreads easily from person to person (is contagious). Getting a flu shot (influenza vaccination) every year is the best way to prevent influenza. What are the causes? Influenza is caused by a virus. You can catch the virus by:  Breathing in droplets from an infected person's cough or sneeze.  Touching something that was recently contaminated with the virus and then touching your mouth, nose, or eyes. What increases the risk? The following factors may make you more likely to get the flu:  Not cleaning your hands frequently with soap and water or alcohol-based hand sanitizer.  Having close contact with many people during cold and flu season.  Touching your mouth, eyes, or nose without washing or sanitizing your hands first.  Not drinking enough fluids or not eating a healthy diet.  Not getting enough sleep or exercise.  Being under a high amount of stress.  Not getting a yearly (annual) flu shot. You may be at a higher risk of complications from the flu, such as a severe lung infection (pneumonia), if you:  Are over the age of 31.  Are pregnant.  Have a weakened disease-fighting system (immune system). You may have a weakened immune system if you:  Have HIV or AIDS.  Are undergoing  chemotherapy.  Aretaking medicines that reduce the activity of (suppress) the immune system.  Have a long-term (chronic) illness, such as heart disease, kidney disease, diabetes, or lung disease.  Have a liver disorder.  Are obese.  Have anemia. What are the signs or symptoms? Symptoms of this condition typically last 4-10 days and may include:  Fever.  Chills.  Headache, body aches, or muscle aches.  Sore throat.  Cough.  Runny or congested nose.  Chest discomfort and cough.  Poor appetite.  Weakness or tiredness (fatigue).  Dizziness.  Nausea or vomiting. How is this diagnosed? This condition may be diagnosed based on your medical history and a physical exam. Your health care provider may do a nose or throat swab test to confirm the diagnosis. How is this treated? If influenza is detected early, you can be treated with antiviral medicine that can reduce the length of your illness and the severity of your symptoms. This medicine may be given by mouth (orally) or through an IV tube that is inserted in one of your veins. The goal of treatment is to relieve symptoms by taking care of yourself at home. This may include taking over-the-counter medicines, drinking plenty of fluids, and adding humidity to the air in your home. In some cases, influenza goes away on its own. Severe influenza or complications from influenza may be treated in a hospital. Follow these instructions at home:  Take over-the-counter and prescription medicines  only as told by your health care provider.  Use a cool mist humidifier to add humidity to the air in your home. This can make breathing easier.  Rest as needed.  Drink enough fluid to keep your urine clear or pale yellow.  Cover your mouth and nose when you cough or sneeze.  Wash your hands with soap and water often, especially after you cough or sneeze. If soap and water are not available, use hand sanitizer.  Stay home from work or  school as told by your health care provider. Unless you are visiting your health care provider, try to avoid leaving home until your fever has been gone for 24 hours without the use of medicine.  Keep all follow-up visits as told by your health care provider. This is important. How is this prevented?  Getting an annual flu shot is the best way to avoid getting the flu. You may get the flu shot in late summer, fall, or winter. Ask your health care provider when you should get your flu shot.  Wash your hands often or use hand sanitizer often.  Avoid contact with people who are sick during cold and flu season.  Eat a healthy diet, drink plenty of fluids, get enough sleep, and exercise regularly. Contact a health care provider if:  You develop new symptoms.  You have:  Chest pain.  Diarrhea.  A fever.  Your cough gets worse.  You produce more mucus.  You feel nauseous or you vomit. Get help right away if:  You develop shortness of breath or difficulty breathing.  Your skin or nails turn a bluish color.  You have severe pain or stiffness in your neck.  You develop a sudden headache or sudden pain in your face or ear.  You cannot stop vomiting. This information is not intended to replace advice given to you by your health care provider. Make sure you discuss any questions you have with your health care provider. Document Released: 08/18/2000 Document Revised: 01/27/2016 Document Reviewed: 06/15/2015 Elsevier Interactive Patient Education  2017 [employer].

## 2016-10-13 NOTE — Progress Notes (Signed)
Subjective:    Patient ID: Kathy Cunningham, female    DOB: July 06, 1936, 81 y.o.   MRN: [id number]  HPI Here for f/u appt and uri   She also has a low grade temp  Yesterday started getting chills- and also body aches  Nose running and sore throat  R ear feels full also  Cough- yellow mucous Exp to daughter with flu   Flu shot 1/26   Wt Readings from Last 3 Encounters:  10/13/16 143 lb 8 oz (65.1 kg)  10/11/16 143 lb 8 oz (65.1 kg)  12/30/15 148 lb 12.8 oz (67.5 kg)     AMW was 2/7 Failed hearing  Mini cog 19/20   Concerned with insomnia  Has fear that someone may break in at night   bp was also elevated  Improved now  bp is stable today  No cp or palpitations or headaches or edema  No side effects to medicines  BP Readings from Last 3 Encounters:  10/13/16 140/68  10/11/16 (!) 154/80  12/30/15 (!) 150/80      DM2 Lab Results  Component Value Date   HGBA1C 6.6 (H) 10/10/2016   Well controled  Eye exam is up to date No low or high glucose readings   Hyperlipidemia Lab Results  Component Value Date   CHOL 166 10/10/2016   CHOL 142 06/09/2015   CHOL 150 10/12/2014   Lab Results  Component Value Date   HDL 56.30 10/10/2016   HDL 58.00 06/09/2015   HDL 61.40 10/12/2014   Lab Results  Component Value Date   LDLCALC 94 10/10/2016   LDLCALC 69 06/09/2015   LDLCALC 76 10/12/2014   Lab Results  Component Value Date   TRIG 78.0 10/10/2016   TRIG 73.0 06/09/2015   TRIG 63.0 10/12/2014   Lab Results  Component Value Date   CHOLHDL 3 10/10/2016   CHOLHDL 2 06/09/2015   CHOLHDL 2 10/12/2014   No results found for: LDLDIRECT crestor and diet   Hx of D def 35.5 - ok  Taking her vit D   Anemia -hx of thalassemia This is stable from the past  Not symptomatic  Lab Results  Component Value Date   WBC 4.9 10/10/2016   HGB 11.2 (L) 10/10/2016   HCT 33.6 (L) 10/10/2016   MCV 93.5 10/10/2016   PLT 284.0 10/10/2016      Patient Active Problem  List   Diagnosis Date Noted  . Influenza 10/13/2016  . Initial Medicare annual wellness visit 06/15/2015  . Routine general medical examination at a health care facility 06/15/2015  . Estrogen deficiency 06/15/2015  . Breast cancer screening 12/17/2013  . [place] 06/13/2013  . Tachycardia 04/21/2013  . Other screening mammogram 04/24/2011  . Glaucoma 04/24/2011  . Vitamin D deficiency 04/24/2011  . Anxiety 10/20/2010  . HYPOKALEMIA 10/22/2009  . OTHER THALASSEMIA 06/10/2008  . Osteoporosis 05/01/2008  . Diabetes type 2, controlled ([place]) 07/17/2007  . Hyperlipidemia associated with type 2 diabetes mellitus ([place]) 07/17/2007  . Anemia 07/17/2007  . Essential hypertension 07/17/2007  . PREMATURE VENTRICULAR CONTRACTIONS, FREQUENT 07/17/2007  . UTI'S, RECURRENT 07/17/2007   Past Medical History:  Diagnosis Date  . Anemia   . Diabetes mellitus    type II  . Dizziness    or vertigo  . Glaucoma   . Hyperlipidemia   . Hypertension    Past Surgical History:  Procedure Laterality Date  . bladder papilloma  2006  . ROTATOR CUFF REPAIR  01/2002  .  vaginal hysterectomy     Social History  Substance Use Topics  . Smoking status: Never Smoker  . Smokeless tobacco: Never Used  . Alcohol use No   Family History  Problem Relation Age of Onset  . Heart disease Mother     MI  . Heart attack Mother   . Hypertension Mother   . Heart disease Father     MI  . Heart attack Father   . Hypertension Father   . Cancer Brother     pancreatic    Allergies  Allergen Reactions  . Atorvastatin     REACTION: cramps  . Risedronate Sodium     Vomiting   . Sulfonamide Derivatives     REACTION: rash   Current Outpatient Prescriptions on File Prior to Visit  Medication Sig Dispense Refill  . bimatoprost (LUMIGAN) 0.03 % ophthalmic solution Place 1 drop into both eyes at bedtime.      . brimonidine (ALPHAGAN P) 0.1 % SOLN Place 1 drop into the left eye 2 (two) times daily.      .  calcium citrate (CALCITRATE - DOSED IN MG ELEMENTAL CALCIUM) 950 MG tablet Take 1 tablet by mouth daily.     . cholecalciferol (VITAMIN D) 1000 UNITS tablet Take 2,000 Units by mouth daily.     . CRESTOR 20 MG tablet Take 1 tablet by mouth  daily 90 tablet 3  . fluticasone (FLONASE) 50 MCG/ACT nasal spray PLACE 2 SPRAYS INTO THE NOSE DAILY AS NEEDED. 16 g 2  . losartan-hydrochlorothiazide (HYZAAR) 100-25 MG tablet TAKE 1 TABLET BY MOUTH  DAILY 90 tablet 0  . metFORMIN (GLUCOPHAGE) 500 MG tablet TAKE 1 TABLET BY MOUTH TWO  TIMES DAILY WITH MEALS 180 tablet 0  . metoprolol (LOPRESSOR) 100 MG tablet TAKE 1 TABLET BY MOUTH TWO  TIMES DAILY 180 tablet 0  . potassium chloride SA (K-DUR,KLOR-CON) 20 MEQ tablet Take 1 tablet by mouth  daily 90 tablet 1   No current facility-administered medications on file prior to visit.     Review of Systems  Constitutional: Positive for appetite change, chills, fatigue and fever.  HENT: Positive for congestion, postnasal drip, rhinorrhea, sinus pressure, sneezing and sore throat. Negative for ear pain.   Eyes: Negative for pain and discharge.  Respiratory: Positive for cough. Negative for shortness of breath, wheezing and stridor.   Cardiovascular: Negative for chest pain.  Gastrointestinal: Negative for diarrhea, nausea and vomiting.  Genitourinary: Negative for frequency, hematuria and urgency.  Musculoskeletal: Negative for arthralgias and myalgias.  Skin: Negative for rash.  Neurological: Positive for headaches. Negative for dizziness, weakness and light-headedness.  Psychiatric/Behavioral: Negative for confusion and dysphoric mood.       Objective:   Physical Exam  Constitutional: She appears well-developed and well-nourished. No distress.  Appears fatigued  HENT:  Head: Normocephalic and atraumatic.  Right Ear: External ear normal.  Left Ear: External ear normal.  Mouth/Throat: Oropharynx is clear and moist.  Nares are injected and congested  No  sinus tenderness Clear rhinorrhea and post nasal drip   Eyes: Conjunctivae and EOM are normal. Pupils are equal, round, and reactive to light. Right eye exhibits no discharge. Left eye exhibits no discharge.  Neck: Normal range of motion. Neck supple. No JVD present. Carotid bruit is not present. No thyromegaly present.  Cardiovascular: Normal rate, regular rhythm, normal heart sounds and intact distal pulses.  Exam reveals no gallop.   Pulmonary/Chest: Effort normal and breath sounds normal. No respiratory distress. She  has no wheezes. She has no rales. She exhibits no tenderness.  No crackles  Abdominal: Soft. Bowel sounds are normal. She exhibits no distension, no abdominal bruit and no mass. There is no tenderness.  Musculoskeletal: She exhibits no edema.  Lymphadenopathy:    She has no cervical adenopathy.  Neurological: She is alert. She has normal reflexes.  Skin: Skin is warm and dry. No rash noted.  Psychiatric: She has a normal mood and affect.          Assessment & Plan:   Problem List Items Addressed This Visit      Cardiovascular and Mediastinum   Essential hypertension - Primary    bp in fair control at this time  Baseline likely lower as she is febrile and not feeling well today  F/u planned for annual exam BP Readings from Last 1 Encounters:  10/13/16 140/68   No changes needed Disc lifstyle change with low sodium diet and exercise          Respiratory   Influenza    With fever and uri symptoms  Temp: 99.1 F (37.3 C)  She has had chills and aches at home  Also exp to daughter with influenza Will tx with tamiflu 5 d  Disc symptomatic care - see instructions on AVS  Update if not starting to improve in a week or if worsening        Relevant Medications   oseltamivir (TAMIFLU) 75 MG capsule     Endocrine   Diabetes type 2, controlled ([place])    Lab Results  Component Value Date   HGBA1C 6.6 (H) 10/10/2016   Well controlled with metformin and diet   Rev eye and foot care      Hyperlipidemia associated with type 2 diabetes mellitus ([place])    Disc goals for lipids and reasons to control them Rev labs with pt Rev low sat fat diet in detail Well controlled with crestor and diet         Other   Anemia    Hx of thalassemia -no change Cbc stable Not symptomatic       Anxiety   OTHER THALASSEMIA    Cbc is stable with hb of 11.2 No symptoms or pallor       Vitamin D deficiency    Vitamin D level is therapeutic with current supplementation Disc importance of this to bone and overall health Level in mid 30s now -better

## 2016-10-13 NOTE — Progress Notes (Signed)
Pre visit review using our clinic review tool, if applicable. No additional management support is needed unless otherwise documented below in the visit note. 

## 2016-10-15 NOTE — Assessment & Plan Note (Signed)
Disc goals for lipids and reasons to control them Rev labs with pt Rev low sat fat diet in detail Well controlled with crestor and diet  

## 2016-10-15 NOTE — Assessment & Plan Note (Signed)
Cbc is stable with hb of 11.2 No symptoms or pallor

## 2016-10-15 NOTE — Assessment & Plan Note (Signed)
Vitamin D level is therapeutic with current supplementation Disc importance of this to bone and overall health Level in mid 30s now -better

## 2016-10-15 NOTE — Assessment & Plan Note (Signed)
Lab Results  Component Value Date   HGBA1C 6.6 (H) 10/10/2016   Well controlled with metformin and diet  Rev eye and foot care

## 2016-10-15 NOTE — Assessment & Plan Note (Signed)
With fever and uri symptoms  Temp: 99.1 F (37.3 C)  She has had chills and aches at home  Also exp to daughter with influenza Will tx with tamiflu 5 d  Disc symptomatic care - see instructions on AVS  Update if not starting to improve in a week or if worsening

## 2016-10-15 NOTE — Assessment & Plan Note (Signed)
bp in fair control at this time  Baseline likely lower as she is febrile and not feeling well today  F/u planned for annual exam BP Readings from Last 1 Encounters:  10/13/16 140/68   No changes needed Disc lifstyle change with low sodium diet and exercise

## 2016-10-15 NOTE — Assessment & Plan Note (Signed)
Hx of thalassemia -no change Cbc stable Not symptomatic

## 2016-10-24 ENCOUNTER — Other Ambulatory Visit: Payer: Self-pay | Admitting: *Deleted

## 2016-10-24 MED ORDER — METOPROLOL TARTRATE 100 MG PO TABS: 100.0000 mg | ORAL_TABLET | Freq: Two times a day (BID) | ORAL | 2 refills | Status: DC

## 2016-12-20 ENCOUNTER — Other Ambulatory Visit: Payer: Self-pay | Admitting: Family Medicine

## 2017-02-07 ENCOUNTER — Other Ambulatory Visit: Payer: Self-pay | Admitting: Family Medicine

## 2017-02-07 ENCOUNTER — Ambulatory Visit (INDEPENDENT_AMBULATORY_CARE_PROVIDER_SITE_OTHER): Payer: Medicare Other | Admitting: Family Medicine

## 2017-02-07 ENCOUNTER — Encounter: Payer: Self-pay | Admitting: Physician Assistant

## 2017-02-07 VITALS — BP 150/80 | HR 75 | Temp 98.2°F | Ht 60.25 in | Wt 140.4 lb

## 2017-02-07 DIAGNOSIS — B029 Zoster without complications: Secondary | ICD-10-CM

## 2017-02-07 MED ORDER — HYDROCODONE-ACETAMINOPHEN 5-325 MG PO TABS: 1.0000 | ORAL_TABLET | Freq: Four times a day (QID) | ORAL | 0 refills | Status: DC | PRN

## 2017-02-07 MED ORDER — VALACYCLOVIR HCL 1 G PO TABS: 1000.0000 mg | ORAL_TABLET | Freq: Three times a day (TID) | ORAL | 0 refills | Status: AC

## 2017-02-07 NOTE — Progress Notes (Signed)
Kathy Cunningham is a 81 y.o. female here for blisters on back.  I acted as a Education administrator for [employer], DO Anselmo Pickler, LPN  History of Present Illness:   Rash  This is a new problem. Episode onset: Pt started having pain on Sunday on right side mid back area and one blister. The problem has been gradually worsening (blisters are spreading) since onset. The affected locations include the back. The rash is characterized by blistering and itchiness. She was exposed to nothing. Pertinent negatives include no anorexia, congestion, cough, diarrhea, eye pain, facial edema, fatigue, fever, joint pain, nail changes, rhinorrhea, shortness of breath, sore throat or vomiting. Treatments tried: pt applied Neosporin. The treatment provided no relief. Her past medical history is significant for varicella. (As a child)   PMHx, SurgHx, SocialHx, Medications, and Allergies were reviewed in the Visit Navigator and updated as appropriate.  Current Medications:    .  bimatoprost (LUMIGAN) 0.03 % ophthalmic solution, Place 1 drop into both eyes at bedtime.  , Disp: , Rfl:  .  brimonidine (ALPHAGAN P) 0.1 % SOLN, Place 1 drop into the left eye 2 (two) times daily.  , Disp: , Rfl:  .  calcium citrate (CALCITRATE - DOSED IN MG ELEMENTAL CALCIUM) 950 MG tablet, Take 1 tablet by mouth daily. , Disp: , Rfl:  .  cholecalciferol (VITAMIN D) 1000 UNITS tablet, Take 2,000 Units by mouth daily. , Disp: , Rfl:  .  fluticasone (FLONASE) 50 MCG/ACT nasal spray, PLACE 2 SPRAYS INTO THE NOSE DAILY AS NEEDED., Disp: 16 g, Rfl: 2 .  metoprolol (LOPRESSOR) 100 MG tablet, Take 1 tablet (100 mg total) by mouth 2 (two) times daily., Disp: 180 tablet, Rfl: 2 .  potassium chloride SA (K-DUR,KLOR-CON) 20 MEQ tablet, Take 1 tablet by mouth  daily, Disp: 90 tablet, Rfl: 1 .  rosuvastatin (CRESTOR) 20 MG tablet, TAKE 1 TABLET BY MOUTH  DAILY, Disp: 90 tablet, Rfl: 2 .  losartan-hydrochlorothiazide (HYZAAR) 100-25 MG tablet, TAKE 1 TABLET BY  MOUTH  DAILY, Disp: 90 tablet, Rfl: 1 .  metFORMIN (GLUCOPHAGE) 500 MG tablet, TAKE 1 TABLET BY MOUTH TWO  TIMES DAILY WITH MEALS, Disp: 180 tablet, Rfl: 1  Review of Systems:   Review of Systems  Constitutional: Negative for fatigue and fever.  HENT: Negative for congestion, rhinorrhea and sore throat.   Eyes: Negative for pain.  Respiratory: Negative for cough and shortness of breath.   Gastrointestinal: Negative for anorexia, diarrhea and vomiting.  Musculoskeletal: Negative for joint pain.  Skin: Positive for rash. Negative for nail changes.   Vitals:   Vitals:   02/07/17 1109  BP: (!) 150/80  Pulse: 75  Temp: 98.2 F (36.8 C)  TempSrc: Oral  SpO2: 98%  Weight: 140 lb 6.1 oz (63.7 kg)  Height: 5' 0.25" (1.53 m)     Body mass index is 27.19 kg/m.  Physical Exam:   Physical Exam  Constitutional: She appears well-nourished.  HENT:  Head: Normocephalic and atraumatic.  Eyes: EOM are normal. Pupils are equal, round, and reactive to light.  Neck: Normal range of motion. Neck supple.  Cardiovascular: Normal rate, regular rhythm, normal heart sounds and intact distal pulses.   Pulmonary/Chest: Effort normal.  Abdominal: Soft.  Skin: Skin is warm. Rash noted. Rash is vesicular.     Psychiatric: She has a normal mood and affect. Her behavior is normal.  Nursing note and vitals reviewed.  Assessment and Plan:   Kathy Cunningham was seen today for blisters  on back.  Diagnoses and all orders for this visit:  Herpes zoster without complication -     HYDROcodone-acetaminophen (NORCO/VICODIN) 5-325 MG tablet; Take 1 tablet by mouth every 6 (six) hours as needed for moderate pain. -     valACYclovir (VALTREX) 1000 MG tablet; Take 1 tablet (1,000 mg total) by mouth 3 (three) times daily.   . Reviewed expectations re: course of current medical issues. . Discussed self-management of symptoms. . Outlined signs and symptoms indicating need for more acute intervention. . Patient  verbalized understanding and all questions were answered. Marland Kitchen Health Maintenance issues including appropriate healthy diet, exercise, and smoking avoidance were discussed with patient. . See orders for this visit as documented in the electronic medical record. . Patient received an After Visit Summary.  CMA served as Education administrator during this visit. History, Physical, and Plan performed by medical provider. The above documentation has been reviewed and is accurate and complete. Briscoe Deutscher, D.O.  Briscoe Deutscher, DO [place] [place] 02/07/2017  No future appointments.

## 2017-02-19 DIAGNOSIS — H401132 Primary open-angle glaucoma, bilateral, moderate stage: Secondary | ICD-10-CM | POA: Diagnosis not present

## 2017-02-19 LAB — HM DIABETES EYE EXAM

## 2017-05-21 ENCOUNTER — Other Ambulatory Visit: Payer: Self-pay | Admitting: Family Medicine

## 2017-05-29 ENCOUNTER — Other Ambulatory Visit: Payer: Self-pay | Admitting: *Deleted

## 2017-05-29 MED ORDER — POTASSIUM CHLORIDE CRYS ER 20 MEQ PO TBCR: 20.0000 meq | EXTENDED_RELEASE_TABLET | Freq: Every day | ORAL | 1 refills | Status: DC

## 2017-05-29 NOTE — Telephone Encounter (Signed)
Last Rx 04/2016 #90 1R. Last CMP 10/2016. pls advise

## 2017-05-29 NOTE — Telephone Encounter (Signed)
appt scheduled and med refill 

## 2017-05-29 NOTE — Telephone Encounter (Signed)
Please schedule f/u for Feb and refill until then

## 2017-07-05 ENCOUNTER — Other Ambulatory Visit: Payer: Self-pay | Admitting: Family Medicine

## 2017-07-19 DIAGNOSIS — H401132 Primary open-angle glaucoma, bilateral, moderate stage: Secondary | ICD-10-CM | POA: Diagnosis not present

## 2017-07-19 LAB — HM DIABETES EYE EXAM

## 2017-08-06 ENCOUNTER — Other Ambulatory Visit: Payer: Self-pay | Admitting: Family Medicine

## 2017-08-07 ENCOUNTER — Other Ambulatory Visit: Payer: Self-pay | Admitting: *Deleted

## 2017-08-07 MED ORDER — METFORMIN HCL 500 MG PO TABS: ORAL_TABLET | ORAL | 0 refills | Status: DC

## 2017-09-01 ENCOUNTER — Emergency Department (HOSPITAL_COMMUNITY)
Admission: EM | Admit: 2017-09-01 | Discharge: 2017-09-01 | Disposition: A | Discharge status: Home / Self Care | Payer: Medicare Other | Attending: Emergency Medicine | Admitting: Emergency Medicine

## 2017-09-01 ENCOUNTER — Encounter (HOSPITAL_COMMUNITY): Payer: Self-pay | Admitting: *Deleted

## 2017-09-01 ENCOUNTER — Emergency Department (HOSPITAL_COMMUNITY): Payer: Medicare Other

## 2017-09-01 ENCOUNTER — Other Ambulatory Visit: Payer: Self-pay

## 2017-09-01 DIAGNOSIS — R002 Palpitations: Secondary | ICD-10-CM | POA: Diagnosis not present

## 2017-09-01 DIAGNOSIS — Z79899 Other long term (current) drug therapy: Secondary | ICD-10-CM | POA: Insufficient documentation

## 2017-09-01 DIAGNOSIS — D649 Anemia, unspecified: Secondary | ICD-10-CM | POA: Diagnosis not present

## 2017-09-01 DIAGNOSIS — E876 Hypokalemia: Secondary | ICD-10-CM

## 2017-09-01 DIAGNOSIS — I10 Essential (primary) hypertension: Secondary | ICD-10-CM | POA: Diagnosis not present

## 2017-09-01 DIAGNOSIS — R000 Tachycardia, unspecified: Secondary | ICD-10-CM | POA: Diagnosis not present

## 2017-09-01 DIAGNOSIS — R079 Chest pain, unspecified: Secondary | ICD-10-CM | POA: Diagnosis not present

## 2017-09-01 DIAGNOSIS — Z7984 Long term (current) use of oral hypoglycemic drugs: Secondary | ICD-10-CM | POA: Diagnosis not present

## 2017-09-01 DIAGNOSIS — R0602 Shortness of breath: Secondary | ICD-10-CM | POA: Diagnosis not present

## 2017-09-01 DIAGNOSIS — E119 Type 2 diabetes mellitus without complications: Secondary | ICD-10-CM | POA: Insufficient documentation

## 2017-09-01 LAB — BASIC METABOLIC PANEL
Anion gap: 9 (ref 5–15)
BUN: 11 mg/dL (ref 6–20)
CHLORIDE: 100 mmol/L — AB (ref 101–111)
CO2: 27 mmol/L (ref 22–32)
CREATININE: 0.98 mg/dL (ref 0.44–1.00)
Calcium: 9.6 mg/dL (ref 8.9–10.3)
GFR calc non Af Amer: 53 mL/min — ABNORMAL LOW (ref >60)
Glucose, Bld: 193 mg/dL — ABNORMAL HIGH (ref 65–99)
POTASSIUM: 2.9 mmol/L — AB (ref 3.5–5.1)
Sodium: 136 mmol/L (ref 135–145)

## 2017-09-01 LAB — CBC
HEMATOCRIT: 32.6 % — AB (ref 36.0–46.0)
HEMOGLOBIN: 10.4 g/dL — AB (ref 12.0–15.0)
MCH: 29.7 pg (ref 26.0–34.0)
MCHC: 31.9 g/dL (ref 30.0–36.0)
MCV: 93.1 fL (ref 78.0–100.0)
Platelets: 281 10*3/uL (ref 150–400)
RBC: 3.5 MIL/uL — AB (ref 3.87–5.11)
RDW: 13.1 % (ref 11.5–15.5)
WBC: 6.5 10*3/uL (ref 4.0–10.5)

## 2017-09-01 LAB — I-STAT TROPONIN, ED: Troponin i, poc: 0.01 ng/mL (ref 0.00–0.08)

## 2017-09-01 MED ORDER — METOPROLOL TARTRATE 5 MG/5ML IV SOLN
10.0000 mg | Freq: Once | INTRAVENOUS | Status: AC
  Administered 2017-09-01: 10 mg via INTRAVENOUS
  Filled 2017-09-01: qty 10

## 2017-09-01 MED ORDER — POTASSIUM CHLORIDE CRYS ER 20 MEQ PO TBCR
40.0000 meq | EXTENDED_RELEASE_TABLET | Freq: Once | ORAL | Status: AC
  Administered 2017-09-01: 40 meq via ORAL
  Filled 2017-09-01: qty 2

## 2017-09-01 NOTE — ED Notes (Signed)
Pt ambulatory to the restroom.  

## 2017-09-01 NOTE — ED Provider Notes (Signed)
I saw and evaluated the patient, reviewed the resident's note and I agree with the findings and plan.  Pertinent History: no cardiac history - onset of palpitations 3 hours ago - no CP, no SOB and no light headedness.  She has had this before.  Has hx of low K.  In the waiting room, the sx resolved but came back when coming back.  Did not take her metoprolol at night or today (not sure why).  Review of the electronic medical record shows that the patient had consulted with cardiology, Dr. Acie Fredrickson in 2015 after having an admission to the hospital for tachycardia, she was started on metoprolol and has done well ever since.  There is been no changes in her therapy per the patient or per the cardiology notes and in fact there is no cardiology visit since that time.  She has been cared for solely by her family doctor for this condition.  Pertinent Exam findings: Hypertensive, but otherwise well appearing, tachycardic with normal pulses, no JVD and no edema.    Lopressor given.   EKG Interpretation  Date/Time:  Saturday September 01 2017 15:04:52 EST Ventricular Rate:  138 PR Interval:  130 QRS Duration: 66 QT Interval:  298 QTC Calculation: 451 R Axis:   24 Text Interpretation:  Sinus tachycardia with Premature supraventricular complexes Low voltage QRS ST & T wave abnormality, consider inferolateral ischemia Abnormal ECG Since last tracing rate faster Confirmed by Noemi Chapel [phone]) on 09/01/2017 4:26:47 PM       I personally interpreted the EKG as well as the resident and agree with the interpretation on the resident's chart.  Final diagnoses:  Palpitations  Tachycardia  Hypokalemia      Noemi Chapel, MD 09/01/17 2314

## 2017-09-01 NOTE — ED Provider Notes (Signed)
[facility] EMERGENCY DEPARTMENT Provider Note   CSN: [id number] Arrival date & time: 09/01/17  1500     History   Chief Complaint Chief Complaint  Patient presents with  . Palpitations    HPI Kathy Cunningham is a 81 y.o. female.  The history is provided by the patient.  Palpitations   This is a recurrent problem. The current episode started 3 to 5 hours ago. Episode frequency: happened, abated w/o intervention, recurred. The problem has not changed since onset.The problem is associated with an unknown (did not take her Metoprolol today) factor. Pertinent negatives include no diaphoresis, no fever, no chest pain, no chest pressure, no irregular heartbeat, no near-syncope, no abdominal pain, no nausea, no vomiting, no headaches, no back pain, no dizziness, no weakness, no cough and no shortness of breath. She has tried nothing for the symptoms. Risk factors: hypokalemia, previous episode.    Past Medical History:  Diagnosis Date  . Anemia   . Diabetes mellitus    type II  . Dizziness    or vertigo  . Glaucoma   . Hyperlipidemia   . Hypertension     Patient Active Problem List   Diagnosis Date Noted  . Influenza 10/13/2016  . Initial Medicare annual wellness visit 06/15/2015  . Routine general medical examination at a health care facility 06/15/2015  . Estrogen deficiency 06/15/2015  . Breast cancer screening 12/17/2013  . Tachycardia 04/21/2013  . Other screening mammogram 04/24/2011  . Glaucoma 04/24/2011  . Vitamin D deficiency 04/24/2011  . Anxiety 10/20/2010  . HYPOKALEMIA 10/22/2009  . OTHER THALASSEMIA 06/10/2008  . Osteoporosis 05/01/2008  . Diabetes type 2, controlled ([place]) 07/17/2007  . Hyperlipidemia associated with type 2 diabetes mellitus ([place]) 07/17/2007  . Anemia 07/17/2007  . Essential hypertension 07/17/2007  . PREMATURE VENTRICULAR CONTRACTIONS, FREQUENT 07/17/2007  . UTI'S, RECURRENT 07/17/2007    Past Surgical History:    Procedure Laterality Date  . bladder papilloma  2006  . ROTATOR CUFF REPAIR  01/2002  . vaginal hysterectomy      OB History    No data available       Home Medications    Prior to Admission medications   Medication Sig Start Date End Date Taking? Authorizing Provider  bimatoprost (LUMIGAN) 0.01 % SOLN Place 1 drop into both eyes at bedtime.   Yes [provider]  brimonidine (ALPHAGAN P) 0.1 % SOLN Place 1 drop into both eyes 2 (two) times daily.    Yes [provider]  calcium citrate (CALCITRATE - DOSED IN MG ELEMENTAL CALCIUM) 950 MG tablet Take 1 tablet by mouth daily.    Yes [provider]  cholecalciferol (VITAMIN D) 1000 UNITS tablet Take 2,000 Units by mouth daily.    Yes [provider]  fluticasone (FLONASE) 50 MCG/ACT nasal spray PLACE 2 SPRAYS INTO THE NOSE DAILY AS NEEDED. Patient taking differently: PLACE 2 SPRAYS INTO THE NOSE DAILY AS NEEDED FOR SEASONAL ALLERGIES 05/06/15  Yes Tower, Wynelle Fanny, MD  losartan-hydrochlorothiazide (HYZAAR) 100-25 MG tablet TAKE 1 TABLET BY MOUTH  DAILY 07/06/17  Yes Tower, Wynelle Fanny, MD  metFORMIN (GLUCOPHAGE) 500 MG tablet TAKE 1 TABLET BY MOUTH TWO  TIMES DAILY WITH MEALS Patient taking differently: Take 500 mg by mouth 2 (two) times daily with a meal.  08/07/17  Yes Tower, Wynelle Fanny, MD  metoprolol (LOPRESSOR) 100 MG tablet Take 1 tablet (100 mg total) by mouth 2 (two) times daily. 10/24/16  Yes Tower, [place]  A, MD  potassium chloride SA (K-DUR,KLOR-CON) 20 MEQ tablet TAKE 1 TABLET BY MOUTH  DAILY Patient taking differently: TAKE 1 TABLET (20 MEQ) BY MOUTH  DAILY 08/06/17  Yes Tower, Mar[place] A, MD  rosuvastatin (CRESTOR) 20 MG tablet TAKE 1 TABLET BY MOUTH  DAILY Patient taking differently: TAKE 1 TABLET (20 MG) BY MOUTH  DAILY AT NOON 12/21/16  Yes Tower, Wynelle Fanny, MD  HYDROcodone-acetaminophen (NORCO/VICODIN) 5-325 MG tablet Take 1 tablet by mouth every 6 (six) hours as needed for moderate pain. Patient not  taking: Reported on 09/01/2017 02/07/17   Briscoe Deutscher, DO    Family History Family History  Problem Relation Age of Onset  . Heart disease Mother        MI  . Heart attack Mother   . Hypertension Mother   . Heart disease Father        MI  . Heart attack Father   . Hypertension Father   . Cancer Brother        pancreatic     Social History Social History   Tobacco Use  . Smoking status: Never Smoker  . Smokeless tobacco: Never Used  Substance Use Topics  . Alcohol use: No    Alcohol/week: 0.0 oz  . Drug use: No     Allergies   Atorvastatin; Risedronate sodium; and Sulfonamide derivatives   Review of Systems Review of Systems  Constitutional: Negative for chills, diaphoresis and fever.  HENT: Negative for rhinorrhea and sore throat.   Eyes: Negative for visual disturbance.  Respiratory: Negative for cough and shortness of breath.   Cardiovascular: Positive for palpitations. Negative for chest pain and near-syncope.  Gastrointestinal: Negative for abdominal pain, nausea and vomiting.  Genitourinary: Negative for dysuria.  Musculoskeletal: Negative for back pain and neck pain.  Skin: Negative for rash.  Neurological: Negative for dizziness, syncope, weakness and headaches.  All other systems reviewed and are negative.    Physical Exam Updated Vital Signs BP (!) 144/59   Pulse 90   Temp 99 F (37.2 C) (Oral)   Resp (!) 23   SpO2 99%   Physical Exam  Constitutional: She is oriented to person, place, and time. She appears well-developed and well-nourished. No distress.  HENT:  Head: Normocephalic and atraumatic.  Eyes: Conjunctivae are normal.  Neck: Neck supple. No JVD present.  Cardiovascular: Regular rhythm.  No murmur heard. Tachycardia, P-waves easily visible on in-room monitor  Pulmonary/Chest: Effort normal and breath sounds normal. No respiratory distress.  Abdominal: Soft. There is no tenderness.  Musculoskeletal: Normal range of motion. She  exhibits no edema.  Neurological: She is alert and oriented to person, place, and time.  Skin: Skin is warm and dry.  Psychiatric: She has a normal mood and affect.  Nursing note and vitals reviewed.    ED Treatments / Results  Labs (all labs ordered are listed, but only abnormal results are displayed) Labs Reviewed  BASIC METABOLIC PANEL - Abnormal; Notable for the following components:      Result Value   Potassium 2.9 (*)    Chloride 100 (*)    Glucose, Bld 193 (*)    GFR calc non Af Amer 53 (*)    All other components within normal limits  CBC - Abnormal; Notable for the following components:   RBC 3.50 (*)    Hemoglobin 10.4 (*)    HCT 32.6 (*)    All other components within normal limits  I-STAT TROPONIN, ED    EKG  EKG Interpretation  Date/Time:  Saturday September 01 2017 15:04:52 EST Ventricular Rate:  138 PR Interval:  130 QRS Duration: 66 QT Interval:  298 QTC Calculation: 451 R Axis:   24 Text Interpretation:  Sinus tachycardia with Premature supraventricular complexes Low voltage QRS ST & T wave abnormality, consider inferolateral ischemia Abnormal ECG Since last tracing rate faster Confirmed by Noemi Chapel [phone]) on 09/01/2017 4:26:47 PM       Radiology Dg Chest 2 View  Result Date: 09/01/2017 CLINICAL DATA:  Short of breath and chest pain for 1 day EXAM: CHEST  2 VIEW COMPARISON:  04/17/2013 FINDINGS: Normal heart size. Lungs clear. No pneumothorax. Tiny pleural effusions are best seen on the lateral view. IMPRESSION: Tiny bilateral pleural effusions. Electronically Signed   By: Marybelle Killings M.D.   On: 09/01/2017 15:55    Procedures Procedures (including critical care time)  Medications Ordered in ED Medications  metoprolol tartrate (LOPRESSOR) injection 10 mg (10 mg Intravenous Given 09/01/17 1651)  potassium chloride SA (K-DUR,KLOR-CON) CR tablet 40 mEq (40 mEq Oral Given 09/01/17 1652)     Initial Impression / Assessment and Plan / ED  Course  I have reviewed the triage vital signs and the nursing notes.  Pertinent labs & imaging results that were available during my care of the patient were reviewed by me and considered in my medical decision making (see chart for details).     Pt with h/o hypokalemia presents with palpitations. Says they started suddenly this afternoon @ 1330, so she came to the ED for evaluation; her palpitations abated while in the waiting room, but recurred on the way to her room. Denies associated, HA, lightheadedness, CP, SOB, N/V/D, urinary symptoms, or recent illness. Pt notes that she missed her doses of metoprolol last night and this morning (she got busy & forgot).  VS & exam as above. EKG: ST @ 138bpm w/TWI in V5-6 no present on prior tracing; suspect these changes are rate related as the Pt has no CP/SOB. 10mg  IV Lopressor given for tachycardia.  CXR w/tiny b/l pleural effusions. Labs remarkable for K 2.9, troponin 0.01; oral potassium supplement provided in the ED.  On re-evaluation after medication administration, Pt's HR in the 80s & she feels much better. Suspect the Pt's symptoms related to combination of hypokalemia and not taking her metoprolol.  Explained all results to the Pt. Will discharge the Pt home. Recommending follow-up with PCP. ED return precautions provided. Pt acknowledged understanding of, and concurrence with the plan. All questions answered to her satisfaction. In stable condition at the time of discharge.  Final Clinical Impressions(s) / ED Diagnoses   Final diagnoses:  Palpitations  Tachycardia  Hypokalemia    ED Discharge Orders    None       Jenny Reichmann, MD 09/01/17 2240    Noemi Chapel, MD 09/01/17 2314

## 2017-09-01 NOTE — ED Triage Notes (Signed)
Pt reports onset this afternoon of palpitations with sob. Denies cp. HR 140 at triage.

## 2017-09-11 ENCOUNTER — Ambulatory Visit (INDEPENDENT_AMBULATORY_CARE_PROVIDER_SITE_OTHER): Payer: Medicare Other

## 2017-09-11 DIAGNOSIS — Z23 Encounter for immunization: Secondary | ICD-10-CM

## 2017-09-24 ENCOUNTER — Other Ambulatory Visit: Payer: Self-pay | Admitting: Family Medicine

## 2017-09-24 DIAGNOSIS — Z139 Encounter for screening, unspecified: Secondary | ICD-10-CM

## 2017-09-27 ENCOUNTER — Ambulatory Visit
Admission: RE | Admit: 2017-09-27 | Discharge: 2017-09-27 | Disposition: A | Discharge status: Home / Self Care | Payer: Medicare Other | Source: Ambulatory Visit | Attending: Family Medicine | Admitting: Family Medicine

## 2017-09-27 DIAGNOSIS — Z1231 Encounter for screening mammogram for malignant neoplasm of breast: Secondary | ICD-10-CM | POA: Diagnosis not present

## 2017-09-27 DIAGNOSIS — Z139 Encounter for screening, unspecified: Secondary | ICD-10-CM

## 2017-10-09 ENCOUNTER — Ambulatory Visit: Payer: Medicare Other | Admitting: Family Medicine

## 2017-10-09 ENCOUNTER — Encounter: Payer: Self-pay | Admitting: Family Medicine

## 2017-10-09 VITALS — BP 138/60 | HR 69 | Temp 97.5°F | Ht 60.25 in | Wt 134.8 lb

## 2017-10-09 DIAGNOSIS — E119 Type 2 diabetes mellitus without complications: Secondary | ICD-10-CM

## 2017-10-09 DIAGNOSIS — E785 Hyperlipidemia, unspecified: Secondary | ICD-10-CM | POA: Diagnosis not present

## 2017-10-09 DIAGNOSIS — D568 Other thalassemias: Secondary | ICD-10-CM

## 2017-10-09 DIAGNOSIS — D649 Anemia, unspecified: Secondary | ICD-10-CM

## 2017-10-09 DIAGNOSIS — E1169 Type 2 diabetes mellitus with other specified complication: Secondary | ICD-10-CM | POA: Diagnosis not present

## 2017-10-09 DIAGNOSIS — E559 Vitamin D deficiency, unspecified: Secondary | ICD-10-CM | POA: Diagnosis not present

## 2017-10-09 DIAGNOSIS — I10 Essential (primary) hypertension: Secondary | ICD-10-CM | POA: Diagnosis not present

## 2017-10-09 LAB — COMPREHENSIVE METABOLIC PANEL
ALT: 12 U/L (ref 0–35)
AST: 17 U/L (ref 0–37)
Albumin: 4.1 g/dL (ref 3.5–5.2)
Alkaline Phosphatase: 51 U/L (ref 39–117)
BUN: 18 mg/dL (ref 6–23)
CHLORIDE: 101 meq/L (ref 96–112)
CO2: 31 meq/L (ref 19–32)
CREATININE: 0.95 mg/dL (ref 0.40–1.20)
Calcium: 9.4 mg/dL (ref 8.4–10.5)
GFR: 72.81 mL/min (ref >60.00)
Glucose, Bld: 160 mg/dL — ABNORMAL HIGH (ref 70–99)
Potassium: 3.8 mEq/L (ref 3.5–5.1)
Sodium: 139 mEq/L (ref 135–145)
Total Bilirubin: 0.7 mg/dL (ref 0.2–1.2)
Total Protein: 7.1 g/dL (ref 6.0–8.3)

## 2017-10-09 LAB — CBC WITH DIFFERENTIAL/PLATELET
BASOS ABS: 0.1 10*3/uL (ref 0.0–0.1)
BASOS PCT: 1.2 % (ref 0.0–3.0)
EOS ABS: 0.1 10*3/uL (ref 0.0–0.7)
Eosinophils Relative: 2.5 % (ref 0.0–5.0)
HCT: 33.4 % — ABNORMAL LOW (ref 36.0–46.0)
HEMOGLOBIN: 11.1 g/dL — AB (ref 12.0–15.0)
Lymphocytes Relative: 31.1 % (ref 12.0–46.0)
Lymphs Abs: 1.6 10*3/uL (ref 0.7–4.0)
MCHC: 33.2 g/dL (ref 30.0–36.0)
MCV: 93.7 fl (ref 78.0–100.0)
MONO ABS: 0.5 10*3/uL (ref 0.1–1.0)
Monocytes Relative: 9.6 % (ref 3.0–12.0)
NEUTROS ABS: 2.8 10*3/uL (ref 1.4–7.7)
Neutrophils Relative %: 55.6 % (ref 43.0–77.0)
PLATELETS: 300 10*3/uL (ref 150.0–400.0)
RBC: 3.57 Mil/uL — ABNORMAL LOW (ref 3.87–5.11)
RDW: 13.7 % (ref 11.5–15.5)
WBC: 5.1 10*3/uL (ref 4.0–10.5)

## 2017-10-09 LAB — HEMOGLOBIN A1C: Hgb A1c MFr Bld: 6.5 % (ref 4.6–6.5)

## 2017-10-09 LAB — LIPID PANEL
CHOL/HDL RATIO: 2
Cholesterol: 136 mg/dL (ref 0–200)
HDL: 71.2 mg/dL (ref >39.00)
LDL CALC: 51 mg/dL (ref 0–99)
NonHDL: 65.25
Triglycerides: 73 mg/dL (ref 0.0–149.0)
VLDL: 14.6 mg/dL (ref 0.0–40.0)

## 2017-10-09 LAB — TSH: TSH: 1.44 u[IU]/mL (ref 0.35–4.50)

## 2017-10-09 LAB — VITAMIN D 25 HYDROXY (VIT D DEFICIENCY, FRACTURES): VITD: 35.58 ng/mL (ref 30.00–100.00)

## 2017-10-09 MED ORDER — METOPROLOL TARTRATE 100 MG PO TABS: 100.0000 mg | ORAL_TABLET | Freq: Two times a day (BID) | ORAL | 3 refills | Status: DC

## 2017-10-09 MED ORDER — ROSUVASTATIN CALCIUM 20 MG PO TABS: ORAL_TABLET | ORAL | 3 refills | Status: DC

## 2017-10-09 MED ORDER — POTASSIUM CHLORIDE CRYS ER 20 MEQ PO TBCR: EXTENDED_RELEASE_TABLET | ORAL | 3 refills | Status: DC

## 2017-10-09 MED ORDER — METFORMIN HCL 500 MG PO TABS: 500.0000 mg | ORAL_TABLET | Freq: Two times a day (BID) | ORAL | 3 refills | Status: DC

## 2017-10-09 MED ORDER — LOSARTAN POTASSIUM-HCTZ 100-25 MG PO TABS: 1.0000 | ORAL_TABLET | Freq: Every day | ORAL | 3 refills | Status: DC

## 2017-10-09 NOTE — Patient Instructions (Addendum)
Force yourself to socialize a bit-get out with other people This is the most important for brain health with aging  Exercise helps this also  Go to [employer]!!!- do your regular exercise   Make reminders or set alarms to take your medicines so you do not forget doses  At check out we will schedule your next appointment and send for a copy of eye exam for the chart

## 2017-10-09 NOTE — Progress Notes (Signed)
Subjective:    Patient ID: Kathy Cunningham, female    DOB: [birth year], 82 y.o.   MRN: [id number]  HPI Here for f/u of chronic health problems   Not doing a whole lot these days  Tries to take care of herself   Wt Readings from Last 3 Encounters:  10/09/17 134 lb 12 oz (61.1 kg)  02/07/17 140 lb 6.1 oz (63.7 kg)  10/13/16 143 lb 8 oz (65.1 kg)  wt is down 6 lb  Has been trying to cut her portion sizes  She works in the yard when she can with weather  She signed up for silver sneakers - putting it off   (some fear of being around other people)-nervous  26.10 kg/m   bp is up systolic - she took her losartan but not metoprolol (forgot it) No cp or palpitations or headaches or edema  No side effects to medicines  BP Readings from Last 3 Encounters:  09/01/17 (!) 144/59  02/07/17 (!) 150/80  10/13/16 140/68    BP: 138/60  Re check bp was better after sitting  Takes metropolol and losartan hct and K She occ forgets medicine (? Unsure) - she takes it at different times    Pulse Readings from Last 3 Encounters:  10/09/17 69  09/01/17 90  02/07/17 75    DM2 Has not been back in a year  Lab Results  Component Value Date   HGBA1C 6.6 (H) 10/10/2016  lost some weight and eating smaller portions  No high or low blood sugars at home  This is overdue to be checked Metformin and diet  Eye care -was in November  Foot care -no problems   Hyperlipidemia On crestor Lab Results  Component Value Date   CHOL 166 10/10/2016   HDL 56.30 10/10/2016   LDLCALC 94 10/10/2016   TRIG 78.0 10/10/2016   CHOLHDL 3 10/10/2016  expects it will look good No missed doses of crestor  No fatty foods  Due for labs   Hx of thalassemia  Due for cbc  Asymptomatic (no fatigue or dizziness or palpitations)  Last Hb 11.2  Hx of vit D def Supplementing  Last level in low 30s  Taking her vit D daily in addition to ca plus D   Patient Active Problem List   Diagnosis Date Noted  . Initial  Medicare annual wellness visit 06/15/2015  . Routine general medical examination at a health care facility 06/15/2015  . Estrogen deficiency 06/15/2015  . Breast cancer screening 12/17/2013  . Tachycardia 04/21/2013  . Other screening mammogram 04/24/2011  . Glaucoma 04/24/2011  . Vitamin D deficiency 04/24/2011  . Anxiety 10/20/2010  . HYPOKALEMIA 10/22/2009  . OTHER THALASSEMIA 06/10/2008  . Osteoporosis 05/01/2008  . Diabetes type 2, controlled ([place]) 07/17/2007  . Hyperlipidemia associated with type 2 diabetes mellitus ([place]) 07/17/2007  . Anemia 07/17/2007  . Essential hypertension 07/17/2007  . PREMATURE VENTRICULAR CONTRACTIONS, FREQUENT 07/17/2007  . UTI'S, RECURRENT 07/17/2007   Past Medical History:  Diagnosis Date  . Anemia   . Diabetes mellitus    type II  . Dizziness    or vertigo  . Glaucoma   . Hyperlipidemia   . Hypertension    Past Surgical History:  Procedure Laterality Date  . bladder papilloma  2006  . ROTATOR CUFF REPAIR  01/2002  . vaginal hysterectomy     Social History   Tobacco Use  . Smoking status: Never Smoker  . Smokeless tobacco: Never Used  Substance Use Topics  . Alcohol use: No    Alcohol/week: 0.0 oz  . Drug use: No   Family History  Problem Relation Age of Onset  . Heart disease Mother        MI  . Heart attack Mother   . Hypertension Mother   . Heart disease Father        MI  . Heart attack Father   . Hypertension Father   . Cancer Brother        pancreatic    Allergies  Allergen Reactions  . Atorvastatin Other (See Comments)    REACTION: cramps  . Risedronate Sodium Nausea And Vomiting       . Sulfonamide Derivatives Rash   Current Outpatient Medications on File Prior to Visit  Medication Sig Dispense Refill  . bimatoprost (LUMIGAN) 0.01 % SOLN Place 1 drop into both eyes at bedtime.    . brimonidine (ALPHAGAN P) 0.1 % SOLN Place 1 drop into both eyes 2 (two) times daily.     . calcium citrate (CALCITRATE -  DOSED IN MG ELEMENTAL CALCIUM) 950 MG tablet Take 1 tablet by mouth daily.     . cholecalciferol (VITAMIN D) 1000 UNITS tablet Take 2,000 Units by mouth daily.     . fluticasone (FLONASE) 50 MCG/ACT nasal spray PLACE 2 SPRAYS INTO THE NOSE DAILY AS NEEDED. (Patient taking differently: PLACE 2 SPRAYS INTO THE NOSE DAILY AS NEEDED FOR SEASONAL ALLERGIES) 16 g 2  . HYDROcodone-acetaminophen (NORCO/VICODIN) 5-325 MG tablet Take 1 tablet by mouth every 6 (six) hours as needed for moderate pain. 20 tablet 0   No current facility-administered medications on file prior to visit.     Review of Systems  Constitutional: Negative for activity change, appetite change, fatigue, fever and unexpected weight change.  HENT: Negative for congestion, ear pain, rhinorrhea, sinus pressure and sore throat.   Eyes: Negative for pain, redness and visual disturbance.  Respiratory: Negative for cough, shortness of breath and wheezing.   Cardiovascular: Negative for chest pain and palpitations.  Gastrointestinal: Negative for abdominal pain, blood in stool, constipation and diarrhea.  Endocrine: Negative for polydipsia and polyuria.  Genitourinary: Negative for dysuria, frequency and urgency.  Musculoskeletal: Negative for arthralgias, back pain and myalgias.  Skin: Negative for pallor and rash.  Allergic/Immunologic: Negative for environmental allergies.  Neurological: Negative for dizziness, syncope and headaches.  Hematological: Negative for adenopathy. Does not bruise/bleed easily.  Psychiatric/Behavioral: Negative for decreased concentration and dysphoric mood. The patient is not nervous/anxious.        Objective:   Physical Exam  Constitutional: She appears well-developed and well-nourished. No distress.  Well appearing   HENT:  Head: Normocephalic and atraumatic.  Mouth/Throat: Oropharynx is clear and moist.  Eyes: Conjunctivae and EOM are normal. Pupils are equal, round, and reactive to light.  Neck:  Normal range of motion. Neck supple. No JVD present. Carotid bruit is not present. No thyromegaly present.  Cardiovascular: Normal rate, regular rhythm, normal heart sounds and intact distal pulses. Exam reveals no gallop.  Pulmonary/Chest: Effort normal and breath sounds normal. No respiratory distress. She has no wheezes. She has no rales.  No crackles  Abdominal: Soft. Bowel sounds are normal. She exhibits no distension, no abdominal bruit and no mass. There is no tenderness.  Musculoskeletal: She exhibits no edema.  No kyphosis   Lymphadenopathy:    She has no cervical adenopathy.  Neurological: She is alert. She has normal reflexes.  Skin: Skin is warm and  dry. No rash noted.  Psychiatric: She has a normal mood and affect.          Assessment & Plan:   Problem List Items Addressed This Visit      Cardiovascular and Mediastinum   Essential hypertension    bp in fair control at this time  BP Readings from Last 1 Encounters:  10/09/17 138/60   No changes needed Disc lifstyle change with low sodium diet and exercise  Has not had metoprolol yet today  Labs today Enc exercise       Relevant Medications   rosuvastatin (CRESTOR) 20 MG tablet   metoprolol tartrate (LOPRESSOR) 100 MG tablet   losartan-hydrochlorothiazide (HYZAAR) 100-25 MG tablet   Other Relevant Orders   CBC with Differential/Platelet (Completed)   Comprehensive metabolic panel (Completed)   Lipid panel (Completed)   TSH (Completed)     Endocrine   Diabetes type 2, controlled ([place]) - Primary    A1C today  Diet is fairly good per pt  On arb for renal protection  Sent for eye exam report  Enc exercise       Relevant Medications   rosuvastatin (CRESTOR) 20 MG tablet   metFORMIN (GLUCOPHAGE) 500 MG tablet   losartan-hydrochlorothiazide (HYZAAR) 100-25 MG tablet   Other Relevant Orders   Hemoglobin A1c (Completed)   Hyperlipidemia associated with type 2 diabetes mellitus ([facility])    Lipid panel today    Disc goals for lipids and reasons to control them Rev labs with pt-last one  Rev low sat fat diet in detail  Continue crestor and diet       Relevant Medications   rosuvastatin (CRESTOR) 20 MG tablet   metoprolol tartrate (LOPRESSOR) 100 MG tablet   metFORMIN (GLUCOPHAGE) 500 MG tablet   losartan-hydrochlorothiazide (HYZAAR) 100-25 MG tablet   Other Relevant Orders   Lipid panel (Completed)     Other   Anemia    From thalassemia  Cbc today  No symptoms       OTHER THALASSEMIA    Cbc today      Relevant Orders   CBC with Differential/Platelet (Completed)   Vitamin D deficiency    D level today  Disc imp of D supplement to bone and overall health      Relevant Orders   VITAMIN D 25 Hydroxy (Vit-D Deficiency, Fractures) (Completed)

## 2017-10-10 ENCOUNTER — Encounter: Payer: Self-pay | Admitting: *Deleted

## 2017-10-10 NOTE — Assessment & Plan Note (Signed)
From thalassemia  Cbc today  No symptoms

## 2017-10-10 NOTE — Assessment & Plan Note (Signed)
bp in fair control at this time  BP Readings from Last 1 Encounters:  10/09/17 138/60   No changes needed Disc lifstyle change with low sodium diet and exercise  Has not had metoprolol yet today  Labs today Enc exercise

## 2017-10-10 NOTE — Assessment & Plan Note (Signed)
D level today  Disc imp of D supplement to bone and overall health

## 2017-10-10 NOTE — Assessment & Plan Note (Signed)
Lipid panel today  Disc goals for lipids and reasons to control them Rev labs with pt-last one  Rev low sat fat diet in detail  Continue crestor and diet

## 2017-10-10 NOTE — Assessment & Plan Note (Signed)
Cbc today

## 2017-10-10 NOTE — Assessment & Plan Note (Signed)
A1C today  Diet is fairly good per pt  On arb for renal protection  Sent for eye exam report  Enc exercise

## 2017-10-16 ENCOUNTER — Encounter: Payer: Self-pay | Admitting: Family Medicine

## 2018-01-21 DIAGNOSIS — H401132 Primary open-angle glaucoma, bilateral, moderate stage: Secondary | ICD-10-CM | POA: Diagnosis not present

## 2018-04-06 ENCOUNTER — Telehealth: Payer: Self-pay | Admitting: Family Medicine

## 2018-04-06 DIAGNOSIS — I10 Essential (primary) hypertension: Secondary | ICD-10-CM

## 2018-04-06 DIAGNOSIS — E559 Vitamin D deficiency, unspecified: Secondary | ICD-10-CM

## 2018-04-06 DIAGNOSIS — E785 Hyperlipidemia, unspecified: Secondary | ICD-10-CM

## 2018-04-06 DIAGNOSIS — Z0000 Encounter for general adult medical examination without abnormal findings: Secondary | ICD-10-CM

## 2018-04-06 DIAGNOSIS — E119 Type 2 diabetes mellitus without complications: Secondary | ICD-10-CM

## 2018-04-06 DIAGNOSIS — E1169 Type 2 diabetes mellitus with other specified complication: Secondary | ICD-10-CM

## 2018-04-06 DIAGNOSIS — D649 Anemia, unspecified: Secondary | ICD-10-CM

## 2018-04-06 DIAGNOSIS — M810 Age-related osteoporosis without current pathological fracture: Secondary | ICD-10-CM

## 2018-04-06 NOTE — Telephone Encounter (Signed)
-----   Message from Eustace Pen, LPN sent at 02/10/8613  1:50 PM EDT ----- Regarding: Labs 8/6 Lab orders needed. Thank you.  Insurance:  [facility] Medicare

## 2018-04-09 ENCOUNTER — Ambulatory Visit: Payer: Self-pay

## 2018-04-09 ENCOUNTER — Ambulatory Visit (INDEPENDENT_AMBULATORY_CARE_PROVIDER_SITE_OTHER): Payer: Medicare Other

## 2018-04-09 VITALS — BP 130/82 | HR 64 | Temp 98.4°F | Ht 60.0 in | Wt 132.0 lb

## 2018-04-09 DIAGNOSIS — E785 Hyperlipidemia, unspecified: Secondary | ICD-10-CM

## 2018-04-09 DIAGNOSIS — E1169 Type 2 diabetes mellitus with other specified complication: Secondary | ICD-10-CM

## 2018-04-09 DIAGNOSIS — Z0000 Encounter for general adult medical examination without abnormal findings: Secondary | ICD-10-CM

## 2018-04-09 DIAGNOSIS — I10 Essential (primary) hypertension: Secondary | ICD-10-CM

## 2018-04-09 DIAGNOSIS — E559 Vitamin D deficiency, unspecified: Secondary | ICD-10-CM | POA: Diagnosis not present

## 2018-04-09 DIAGNOSIS — D649 Anemia, unspecified: Secondary | ICD-10-CM | POA: Diagnosis not present

## 2018-04-09 DIAGNOSIS — E119 Type 2 diabetes mellitus without complications: Secondary | ICD-10-CM | POA: Diagnosis not present

## 2018-04-09 LAB — COMPREHENSIVE METABOLIC PANEL
ALK PHOS: 49 U/L (ref 39–117)
ALT: 8 U/L (ref 0–35)
AST: 17 U/L (ref 0–37)
Albumin: 4.3 g/dL (ref 3.5–5.2)
BILIRUBIN TOTAL: 0.7 mg/dL (ref 0.2–1.2)
BUN: 15 mg/dL (ref 6–23)
CO2: 31 mEq/L (ref 19–32)
CREATININE: 0.95 mg/dL (ref 0.40–1.20)
Calcium: 9.9 mg/dL (ref 8.4–10.5)
Chloride: 102 mEq/L (ref 96–112)
GFR: 72.72 mL/min (ref >60.00)
GLUCOSE: 121 mg/dL — AB (ref 70–99)
Potassium: 4 mEq/L (ref 3.5–5.1)
Sodium: 139 mEq/L (ref 135–145)
TOTAL PROTEIN: 7.1 g/dL (ref 6.0–8.3)

## 2018-04-09 LAB — LIPID PANEL
CHOLESTEROL: 137 mg/dL (ref 0–200)
HDL: 57.7 mg/dL (ref >39.00)
LDL Cholesterol: 64 mg/dL (ref 0–99)
NonHDL: 79.71
Total CHOL/HDL Ratio: 2
Triglycerides: 77 mg/dL (ref 0.0–149.0)
VLDL: 15.4 mg/dL (ref 0.0–40.0)

## 2018-04-09 LAB — CBC WITH DIFFERENTIAL/PLATELET
BASOS ABS: 0.1 10*3/uL (ref 0.0–0.1)
Basophils Relative: 1.1 % (ref 0.0–3.0)
Eosinophils Absolute: 0.2 10*3/uL (ref 0.0–0.7)
Eosinophils Relative: 3.1 % (ref 0.0–5.0)
HCT: 32.7 % — ABNORMAL LOW (ref 36.0–46.0)
Hemoglobin: 10.9 g/dL — ABNORMAL LOW (ref 12.0–15.0)
LYMPHS ABS: 1.7 10*3/uL (ref 0.7–4.0)
Lymphocytes Relative: 33.5 % (ref 12.0–46.0)
MCHC: 33.5 g/dL (ref 30.0–36.0)
MCV: 93 fl (ref 78.0–100.0)
MONO ABS: 0.5 10*3/uL (ref 0.1–1.0)
MONOS PCT: 9.9 % (ref 3.0–12.0)
NEUTROS PCT: 52.4 % (ref 43.0–77.0)
Neutro Abs: 2.7 10*3/uL (ref 1.4–7.7)
PLATELETS: 288 10*3/uL (ref 150.0–400.0)
RBC: 3.51 Mil/uL — AB (ref 3.87–5.11)
RDW: 13.8 % (ref 11.5–15.5)
WBC: 5.1 10*3/uL (ref 4.0–10.5)

## 2018-04-09 LAB — VITAMIN D 25 HYDROXY (VIT D DEFICIENCY, FRACTURES): VITD: 50.02 ng/mL (ref 30.00–100.00)

## 2018-04-09 LAB — TSH: TSH: 1.11 u[IU]/mL (ref 0.35–4.50)

## 2018-04-09 LAB — HEMOGLOBIN A1C: HEMOGLOBIN A1C: 6.3 % (ref 4.6–6.5)

## 2018-04-09 NOTE — Progress Notes (Signed)
PCP notes:   Health maintenance:  Flu vaccine - addressed A1C - completed  Abnormal screenings:   None  Patient concerns:   None  Nurse concerns:  None  Next PCP appt:   04/16/18 @ 0930  I reviewed health advisor's note, was available for consultation, and agree with documentation and plan. Loura Pardon MD

## 2018-04-09 NOTE — Progress Notes (Signed)
Subjective:   Kathy Cunningham is a 82 y.o. female who presents for Medicare Annual (Subsequent) preventive examination.  Review of Systems:  N/A Cardiac Risk Factors include: advanced age (>30men, >57 women);diabetes mellitus;dyslipidemia;hypertension     Objective:     Vitals: BP 130/82 (BP Location: Right Arm, Patient Position: Sitting, Cuff Size: Normal)   Pulse 64   Temp 98.4 F (36.9 C) (Oral)   Ht 5' (1.524 m) Comment: no shoes  Wt 132 lb (59.9 kg)   SpO2 99%   BMI 25.78 kg/m   Body mass index is 25.78 kg/m.  Advanced Directives 04/09/2018 10/11/2016 06/03/2015  Does Patient Have a Medical Advance Directive? No No No  Would patient like information on creating a medical advance directive? No - Patient declined - No - patient declined information    Tobacco Social History   Tobacco Use  Smoking Status Never Smoker  Smokeless Tobacco Never Used     Counseling given: No   Clinical Intake:  Pre-visit preparation completed: Yes  Pain : No/denies pain Pain Score: 0-No pain     Nutritional Status: BMI 25 -29 Overweight Nutritional Risks: None Diabetes: Yes CBG done?: No Did pt. bring in CBG monitor from home?: No  How often do you need to have someone help you when you read instructions, pamphlets, or other written materials from your doctor or pharmacy?: 1 - Never  Interpreter Needed?: No  Comments: pt is a widow and lives alone Information entered by :: LPinson, LPN  Past Medical History:  Diagnosis Date  . Anemia   . Diabetes mellitus    type II  . Dizziness    or vertigo  . Glaucoma   . Hyperlipidemia   . Hypertension    Past Surgical History:  Procedure Laterality Date  . bladder papilloma  2006  . ROTATOR CUFF REPAIR  01/2002  . vaginal hysterectomy     Family History  Problem Relation Age of Onset  . Heart disease Mother        MI  . Heart attack Mother   . Hypertension Mother   . Heart disease Father        MI  . Heart attack  Father   . Hypertension Father   . Cancer Brother        pancreatic    Social History   Socioeconomic History  . Marital status: Married    Spouse name: Not on file  . Number of children: Not on file  . Years of education: Not on file  . Highest education level: Not on file  Occupational History  . Not on file  Social Needs  . Financial resource strain: Not on file  . Food insecurity:    Worry: Not on file    Inability: Not on file  . Transportation needs:    Medical: Not on file    Non-medical: Not on file  Tobacco Use  . Smoking status: Never Smoker  . Smokeless tobacco: Never Used  Substance and Sexual Activity  . Alcohol use: No    Alcohol/week: 0.0 oz  . Drug use: No  . Sexual activity: Never  Lifestyle  . Physical activity:    Days per week: Not on file    Minutes per session: Not on file  . Stress: Not on file  Relationships  . Social connections:    Talks on phone: Not on file    Gets together: Not on file    Attends religious service: Not  on file    Active member of club or organization: Not on file    Attends meetings of clubs or organizations: Not on file    Relationship status: Not on file  Other Topics Concern  . Not on file  Social History Narrative  . Not on file    Outpatient Encounter Medications as of 04/09/2018  Medication Sig  . bimatoprost (LUMIGAN) 0.01 % SOLN Place 1 drop into both eyes at bedtime.  . brimonidine (ALPHAGAN P) 0.1 % SOLN Place 1 drop into both eyes 2 (two) times daily.   . calcium citrate (CALCITRATE - DOSED IN MG ELEMENTAL CALCIUM) 950 MG tablet Take 1 tablet by mouth daily.   . cholecalciferol (VITAMIN D) 1000 UNITS tablet Take 2,000 Units by mouth daily.   . fluticasone (FLONASE) 50 MCG/ACT nasal spray PLACE 2 SPRAYS INTO THE NOSE DAILY AS NEEDED. (Patient taking differently: PLACE 2 SPRAYS INTO THE NOSE DAILY AS NEEDED FOR SEASONAL ALLERGIES)  . losartan-hydrochlorothiazide (HYZAAR) 100-25 MG tablet Take 1 tablet by  mouth daily.  . metFORMIN (GLUCOPHAGE) 500 MG tablet Take 1 tablet (500 mg total) by mouth 2 (two) times daily with a meal.  . metoprolol tartrate (LOPRESSOR) 100 MG tablet Take 1 tablet (100 mg total) by mouth 2 (two) times daily.  . potassium chloride SA (K-DUR,KLOR-CON) 20 MEQ tablet TAKE 1 TABLET (20 MEQ) BY MOUTH  DAILY  . rosuvastatin (CRESTOR) 20 MG tablet TAKE 1 TABLET (20 MG) BY MOUTH  DAILY AT NOON  . [DISCONTINUED] HYDROcodone-acetaminophen (NORCO/VICODIN) 5-325 MG tablet Take 1 tablet by mouth every 6 (six) hours as needed for moderate pain.   No facility-administered encounter medications on file as of 04/09/2018.     Activities of Daily Living In your present state of health, do you have any difficulty performing the following activities: 04/09/2018  Hearing? N  Vision? N  Difficulty concentrating or making decisions? Y  Walking or climbing stairs? N  Dressing or bathing? N  Doing errands, shopping? N  Preparing Food and eating ? N  Using the Toilet? N  In the past six months, have you accidently leaked urine? N  Do you have problems with loss of bowel control? N  Managing your Medications? N  Managing your Finances? N  Housekeeping or managing your Housekeeping? N  Some recent data might be hidden    Patient Care Team: Tower, Wynelle Fanny, MD as PCP - General Birder Robson, MD as Referring Physician (Ophthalmology)    Assessment:   This is a routine wellness examination for Kathy Cunningham.   Hearing Screening   125Hz  250Hz  500Hz  1000Hz  2000Hz  3000Hz  4000Hz  6000Hz  8000Hz   Right ear:   40 40 40  40    Left ear:   40 40 40  40    Vision Screening Comments: March 2019 with Dr. George Ina    Exercise Activities and Dietary recommendations Current Exercise Habits: Home exercise routine, Type of exercise: walking, Time (Minutes): 30, Frequency (Times/Week): 3, Weekly Exercise (Minutes/Week): 90, Intensity: Mild, Exercise limited by: None identified  Goals    . Patient Stated      Starting 04/09/18, I will continue to take medications as prescribed.        Fall Risk Fall Risk  04/09/2018 10/11/2016 06/15/2015  Falls in the past year? No Yes Yes  Comment - ER treatment sought after fall in parking lot fell on 06/03/15  Number falls in past yr: - 1 1  Injury with Fall? - Yes Yes  Depression Screen PHQ 2/9 Scores 04/09/2018 10/11/2016 06/15/2015  PHQ - 2 Score 2 0 0  PHQ- 9 Score 3 - -     Cognitive Function MMSE - Mini Mental State Exam 04/09/2018 10/11/2016  Orientation to time 5 5  Orientation to Place 5 5  Registration 3 3  Attention/ Calculation 0 0  Recall 3 2  Recall-comments - pt was unable to recall 1 of 3 words  Language- name 2 objects 0 0  Language- repeat 1 1  Language- follow 3 step command 3 3  Language- read & follow direction 0 0  Write a sentence 0 0  Copy design 0 0  Total score 20 19        Immunization History  Administered Date(s) Administered  . Influenza,inj,Quad PF,6+ Mos 06/13/2013, 09/11/2014, 06/15/2015, 09/29/2016, 09/11/2017  . Pneumococcal Conjugate-13 09/11/2014  . Pneumococcal Polysaccharide-23 06/13/2013  . Td 09/04/1993, 10/04/2009   Screening Tests Health Maintenance  Topic Date Due  . INFLUENZA VACCINE  12/03/2018 (Originally 04/04/2018)  . OPHTHALMOLOGY EXAM  07/19/2018  . MAMMOGRAM  09/27/2018  . FOOT EXAM  10/09/2018  . HEMOGLOBIN A1C  10/10/2018  . TETANUS/TDAP  10/05/2019  . DEXA SCAN  Completed  . PNA vac Low Risk Adult  Completed     Plan:   I have personally reviewed, addressed, and noted the following in the patient's chart:  A. Medical and social history B. Use of alcohol, tobacco or illicit drugs  C. Current medications and supplements D. Functional ability and status E.  Nutritional status F.  Physical activity G. Advance directives H. List of other physicians I.  Hospitalizations, surgeries, and ER visits in previous 12 months J.  [place] to include hearing, vision, cognitive,  depression L. Referrals and appointments - none  In addition, I have reviewed and discussed with patient certain preventive protocols, quality metrics, and best practice recommendations. A written personalized care plan for preventive services as well as general preventive health recommendations were provided to patient.  See attached scanned questionnaire for additional information.   Signed,   Lindell Noe, MHA, BS, LPN Health Coach

## 2018-04-09 NOTE — Patient Instructions (Signed)
Kathy Cunningham , Thank you for taking time to come for your Medicare Wellness Visit. I appreciate your ongoing commitment to your health goals. Please review the following plan we discussed and let me know if I can assist you in the future.   These are the goals we discussed: Goals    . Patient Stated     Starting 04/09/18, I will continue to take medications as prescribed.        This is a list of the screening recommended for you and due dates:  Health Maintenance  Topic Date Due  . Flu Shot  12/03/2018*  . Eye exam for diabetics  07/19/2018  . Mammogram  09/27/2018  . Complete foot exam   10/09/2018  . Hemoglobin A1C  10/10/2018  . Tetanus Vaccine  10/05/2019  . DEXA scan (bone density measurement)  Completed  . Pneumonia vaccines  Completed  *Topic was postponed. The date shown is not the original due date.   Preventive Care for Adults  A healthy lifestyle and preventive care can promote health and wellness. Preventive health guidelines for adults include the following key practices.  . A routine yearly physical is a good way to check with your health care provider about your health and preventive screening. It is a chance to share any concerns and updates on your health and to receive a thorough exam.  . Visit your dentist for a routine exam and preventive care every 6 months. Brush your teeth twice a day and floss once a day. Good oral hygiene prevents tooth decay and gum disease.  . The frequency of eye exams is based on your age, health, family medical history, use  of contact lenses, and other factors. Follow your health care provider's recommendations for frequency of eye exams.  . Eat a healthy diet. Foods like vegetables, fruits, whole grains, low-fat dairy products, and lean protein foods contain the nutrients you need without too many calories. Decrease your intake of foods high in solid fats, added sugars, and salt. Eat the right amount of calories for you. Get information  about a proper diet from your health care provider, if necessary.  . Regular physical exercise is one of the most important things you can do for your health. Most adults should get at least 150 minutes of moderate-intensity exercise (any activity that increases your heart rate and causes you to sweat) each week. In addition, most adults need muscle-strengthening exercises on 2 or more days a week.  Silver Sneakers may be a benefit available to you. To determine eligibility, you may visit the website: www.silversneakers.com or contact program at [phone]) 625-6904 Mon-Fri between 8AM-8PM.   . Maintain a healthy weight. The body mass index (BMI) is a screening tool to identify possible weight problems. It provides an estimate of body fat based on height and weight. Your health care provider can find your BMI and can help you achieve or maintain a healthy weight.   For adults 20 years and older: ? A BMI below 18.5 is considered underweight. ? A BMI of 18.5 to 24.9 is normal. ? A BMI of 25 to 29.9 is considered overweight. ? A BMI of 30 and above is considered obese.   . Maintain normal blood lipids and cholesterol levels by exercising and minimizing your intake of saturated fat. Eat a balanced diet with plenty of fruit and vegetables. Blood tests for lipids and cholesterol should begin at age 72 and be repeated every 5 years. If your lipid or cholesterol  levels are high, you are over 50, or you are at high risk for heart disease, you may need your cholesterol levels checked more frequently. Ongoing high lipid and cholesterol levels should be treated with medicines if diet and exercise are not working.  . If you smoke, find out from your health care provider how to quit. If you do not use tobacco, please do not start.  . If you choose to drink alcohol, please do not consume more than 2 drinks per day. One drink is considered to be 12 ounces (355 mL) of beer, 5 ounces (148 mL) of wine, or 1.5 ounces (44  mL) of liquor.  . If you are 78-64 years old, ask your health care provider if you should take aspirin to prevent strokes.  . Use sunscreen. Apply sunscreen liberally and repeatedly throughout the day. You should seek shade when your shadow is shorter than you. Protect yourself by wearing long sleeves, pants, a wide-brimmed hat, and sunglasses year round, whenever you are outdoors.  . Once a month, do a whole body skin exam, using a mirror to look at the skin on your back. Tell your health care provider of new moles, moles that have irregular borders, moles that are larger than a pencil eraser, or moles that have changed in shape or color.

## 2018-04-16 ENCOUNTER — Ambulatory Visit (INDEPENDENT_AMBULATORY_CARE_PROVIDER_SITE_OTHER): Payer: Medicare Other | Admitting: Family Medicine

## 2018-04-16 ENCOUNTER — Encounter: Payer: Self-pay | Admitting: Family Medicine

## 2018-04-16 VITALS — BP 139/60 | HR 76 | Temp 97.6°F | Ht 60.0 in | Wt 130.2 lb

## 2018-04-16 DIAGNOSIS — E1169 Type 2 diabetes mellitus with other specified complication: Secondary | ICD-10-CM

## 2018-04-16 DIAGNOSIS — I10 Essential (primary) hypertension: Secondary | ICD-10-CM

## 2018-04-16 DIAGNOSIS — E559 Vitamin D deficiency, unspecified: Secondary | ICD-10-CM

## 2018-04-16 DIAGNOSIS — E119 Type 2 diabetes mellitus without complications: Secondary | ICD-10-CM

## 2018-04-16 DIAGNOSIS — E785 Hyperlipidemia, unspecified: Secondary | ICD-10-CM

## 2018-04-16 DIAGNOSIS — Z0000 Encounter for general adult medical examination without abnormal findings: Secondary | ICD-10-CM

## 2018-04-16 DIAGNOSIS — M810 Age-related osteoporosis without current pathological fracture: Secondary | ICD-10-CM

## 2018-04-16 DIAGNOSIS — D649 Anemia, unspecified: Secondary | ICD-10-CM

## 2018-04-16 DIAGNOSIS — E2839 Other primary ovarian failure: Secondary | ICD-10-CM

## 2018-04-16 MED ORDER — ROSUVASTATIN CALCIUM 20 MG PO TABS: ORAL_TABLET | ORAL | 3 refills | Status: DC

## 2018-04-16 MED ORDER — FLUTICASONE PROPIONATE 50 MCG/ACT NA SUSP: NASAL | 3 refills | Status: DC

## 2018-04-16 MED ORDER — POTASSIUM CHLORIDE CRYS ER 20 MEQ PO TBCR: EXTENDED_RELEASE_TABLET | ORAL | 3 refills | Status: DC

## 2018-04-16 MED ORDER — METOPROLOL TARTRATE 100 MG PO TABS: 100.0000 mg | ORAL_TABLET | Freq: Two times a day (BID) | ORAL | 3 refills | Status: DC

## 2018-04-16 MED ORDER — METFORMIN HCL 500 MG PO TABS: 500.0000 mg | ORAL_TABLET | Freq: Two times a day (BID) | ORAL | 3 refills | Status: DC

## 2018-04-16 MED ORDER — LOSARTAN POTASSIUM-HCTZ 100-25 MG PO TABS: 1.0000 | ORAL_TABLET | Freq: Every day | ORAL | 3 refills | Status: DC

## 2018-04-16 NOTE — Patient Instructions (Addendum)
Don't forget to get a flu shot in the fall  We will sign you up for the cologuard program for colon cancer screening   If you are interested in the new shingles vaccine (Shingrix) - call your local pharmacy to check on coverage and availability  If affordable - have your pharmacy put you on a wait list   At check out we will set up bone density test   Keep taking good care of yourself Labs are stable

## 2018-04-16 NOTE — Progress Notes (Signed)
Subjective:    Patient ID: Kathy Cunningham, female    DOB: 1936-04-19, 82 y.o.   MRN: [id number]  HPI  Here for health maintenance exam and to review chronic medical problems    Doing well  Good summer   Wt Readings from Last 3 Encounters:  04/16/18 130 lb 4 oz (59.1 kg)  04/09/18 132 lb (59.9 kg)  10/09/17 134 lb 12 oz (61.1 kg)  weight is great  Eats healthy and gets regular exercise (does a chair exercise program from TV) 25.44 kg/m   Had amw on 8/6 No concerns  Mammogram 1/19 -negative  Self breast exam-no lumps or changes   dexa 10/16 Mild osteoporosis Tried bisphosphonate -caused vomiting  Takes ca and D Also exercises regularly  No falls or fractures   (last fall over a year ago)  D level is good at 50    Colon cancer screening -colonoscopy 10/09 Neg ifob kit 3/11  Zoster status - interested in shingrix if affordable   bp is up today  No cp or palpitations or headaches or edema  No side effects to medicines  BP Readings from Last 3 Encounters:  04/16/18 (!) 162/78  04/09/18 130/82  10/09/17 138/60    Losartan hct  Metoprolol 100 bid Took them this am - but was rushing to get here    Hx of anemia (thalassemia) Lab Results  Component Value Date   WBC 5.1 04/09/2018   HGB 10.9 (L) 04/09/2018   HCT 32.7 (L) 04/09/2018   MCV 93.0 04/09/2018   PLT 288.0 04/09/2018   fairly stable -last 11.1   DM2 Lab Results  Component Value Date   HGBA1C 6.3 04/09/2018  metformin  Down from 6.5  She is eating smart  Eye exam is up to date  Takes care of feet On arb for renal protection  Good diet   No low glucose readings   Hyperlipidemia Lab Results  Component Value Date   CHOL 137 04/09/2018   CHOL 136 10/09/2017   CHOL 166 10/10/2016   Lab Results  Component Value Date   HDL 57.70 04/09/2018   HDL 71.20 10/09/2017   HDL 56.30 10/10/2016   Lab Results  Component Value Date   LDLCALC 64 04/09/2018   LDLCALC 51 10/09/2017   LDLCALC 94  10/10/2016   Lab Results  Component Value Date   TRIG 77.0 04/09/2018   TRIG 73.0 10/09/2017   TRIG 78.0 10/10/2016   Lab Results  Component Value Date   CHOLHDL 2 04/09/2018   CHOLHDL 2 10/09/2017   CHOLHDL 3 10/10/2016   No results found for: LDLDIRECT crestor and diet  Still well controlled   Lab Results  Component Value Date   TSH 1.11 04/09/2018    Lab Results  Component Value Date   CREATININE 0.95 04/09/2018   BUN 15 04/09/2018   NA 139 04/09/2018   K 4.0 04/09/2018   CL 102 04/09/2018   CO2 31 04/09/2018   Lab Results  Component Value Date   ALT 8 04/09/2018   AST 17 04/09/2018   ALKPHOS 49 04/09/2018   BILITOT 0.7 04/09/2018    Patient Active Problem List   Diagnosis Date Noted  . Initial Medicare annual wellness visit 06/15/2015  . Routine general medical examination at a health care facility 06/15/2015  . Estrogen deficiency 06/15/2015  . Breast cancer screening 12/17/2013  . Tachycardia 04/21/2013  . Other screening mammogram 04/24/2011  . Glaucoma 04/24/2011  . Vitamin D deficiency  04/24/2011  . Anxiety 10/20/2010  . HYPOKALEMIA 10/22/2009  . OTHER THALASSEMIA 06/10/2008  . Osteoporosis 05/01/2008  . Diabetes type 2, controlled ([facility]) 07/17/2007  . Hyperlipidemia associated with type 2 diabetes mellitus ([place]) 07/17/2007  . Anemia 07/17/2007  . Essential hypertension 07/17/2007  . PREMATURE VENTRICULAR CONTRACTIONS, FREQUENT 07/17/2007  . UTI'S, RECURRENT 07/17/2007   Past Medical History:  Diagnosis Date  . Anemia   . Diabetes mellitus    type II  . Dizziness    or vertigo  . Glaucoma   . Hyperlipidemia   . Hypertension    Past Surgical History:  Procedure Laterality Date  . bladder papilloma  2006  . ROTATOR CUFF REPAIR  01/2002  . vaginal hysterectomy     Social History   Tobacco Use  . Smoking status: Never Smoker  . Smokeless tobacco: Never Used  Substance Use Topics  . Alcohol use: No    Alcohol/week: 0.0 standard  drinks  . Drug use: No   Family History  Problem Relation Age of Onset  . Heart disease Mother        MI  . Heart attack Mother   . Hypertension Mother   . Heart disease Father        MI  . Heart attack Father   . Hypertension Father   . Cancer Brother        pancreatic    Allergies  Allergen Reactions  . Atorvastatin Other (See Comments)    REACTION: cramps  . Risedronate Sodium Nausea And Vomiting       . Sulfonamide Derivatives Rash   Current Outpatient Medications on File Prior to Visit  Medication Sig Dispense Refill  . bimatoprost (LUMIGAN) 0.01 % SOLN Place 1 drop into both eyes at bedtime.    . brimonidine (ALPHAGAN P) 0.1 % SOLN Place 1 drop into both eyes 2 (two) times daily.     . calcium citrate (CALCITRATE - DOSED IN MG ELEMENTAL CALCIUM) 950 MG tablet Take 1 tablet by mouth daily.     . cholecalciferol (VITAMIN D) 1000 UNITS tablet Take 2,000 Units by mouth daily.      No current facility-administered medications on file prior to visit.     Review of Systems  Constitutional: Negative for activity change, appetite change, fatigue, fever and unexpected weight change.  HENT: Negative for congestion, ear pain, rhinorrhea, sinus pressure and sore throat.   Eyes: Negative for pain, redness and visual disturbance.       Will need cataract surgery soon  Respiratory: Negative for cough, shortness of breath and wheezing.   Cardiovascular: Negative for chest pain and palpitations.  Gastrointestinal: Negative for abdominal pain, blood in stool, constipation and diarrhea.  Endocrine: Negative for polydipsia and polyuria.  Genitourinary: Negative for dysuria, frequency and urgency.  Musculoskeletal: Negative for arthralgias, back pain and myalgias.       Occ aches and pains  Skin: Negative for pallor and rash.  Allergic/Immunologic: Negative for environmental allergies.  Neurological: Negative for dizziness, syncope and headaches.  Hematological: Negative for  adenopathy. Does not bruise/bleed easily.  Psychiatric/Behavioral: Negative for decreased concentration and dysphoric mood. The patient is not nervous/anxious.        Objective:   Physical Exam  Constitutional: She appears well-developed and well-nourished. No distress.  Well appearing   HENT:  Head: Normocephalic and atraumatic.  Right Ear: External ear normal.  Left Ear: External ear normal.  Mouth/Throat: Oropharynx is clear and moist.  Eyes: Pupils are equal, round,  and reactive to light. Conjunctivae and EOM are normal. No scleral icterus.  Neck: Normal range of motion. Neck supple. No JVD present. Carotid bruit is not present. No thyromegaly present.  Cardiovascular: Normal rate, regular rhythm, normal heart sounds and intact distal pulses. Exam reveals no gallop.  Pulmonary/Chest: Effort normal and breath sounds normal. No respiratory distress. She has no wheezes. She exhibits no tenderness. No breast tenderness, discharge or bleeding.  Abdominal: Soft. Bowel sounds are normal. She exhibits no distension, no abdominal bruit and no mass. There is no tenderness.  Genitourinary: No breast tenderness, discharge or bleeding.  Genitourinary Comments: Breast exam: No mass, nodules, thickening, tenderness, bulging, retraction, inflamation, nipple discharge or skin changes noted.  No axillary or clavicular LA.       Musculoskeletal: Normal range of motion. She exhibits no edema or tenderness.  No kyphosis or acute joint changes   Lymphadenopathy:    She has no cervical adenopathy.  Neurological: She is alert. She has normal reflexes. She displays normal reflexes. No cranial nerve deficit. She exhibits normal muscle tone. Coordination normal.  Skin: Skin is warm and dry. No rash noted. No erythema. No pallor.  Few lentigines  Psychiatric: She has a normal mood and affect.  Cheerful Mentally sharp           Assessment & Plan:   Problem List Items Addressed This Visit       Cardiovascular and Mediastinum   Essential hypertension    bp was better on 2nd check bp in fair control at this time  BP Readings from Last 1 Encounters:  04/16/18 139/60   No changes needed Most recent labs reviewed  Disc lifstyle change with low sodium diet and exercise   Continue losartan hct and metoprolol       Relevant Medications   losartan-hydrochlorothiazide (HYZAAR) 100-25 MG tablet   metoprolol tartrate (LOPRESSOR) 100 MG tablet   rosuvastatin (CRESTOR) 20 MG tablet     Endocrine   Diabetes type 2, controlled ([place])    Well controlled with metformin Lab Results  Component Value Date   HGBA1C 6.3 04/09/2018   Disc eye and foot care  Doing well  F/u 6 mo       Relevant Medications   losartan-hydrochlorothiazide (HYZAAR) 100-25 MG tablet   metFORMIN (GLUCOPHAGE) 500 MG tablet   rosuvastatin (CRESTOR) 20 MG tablet   Hyperlipidemia associated with type 2 diabetes mellitus ([facility])    Well controlled Disc goals for lipids and reasons to control them Rev last labs with pt Rev low sat fat diet in detail Continue crestor and diet  Enc exercise       Relevant Medications   losartan-hydrochlorothiazide (HYZAAR) 100-25 MG tablet   metFORMIN (GLUCOPHAGE) 500 MG tablet   metoprolol tartrate (LOPRESSOR) 100 MG tablet   rosuvastatin (CRESTOR) 20 MG tablet     Musculoskeletal and Integument   Osteoporosis    OP 2016 Intol of bisphosphonate  On ca and D D level is tx at 50 No falls or fx Planning next dexa /ref done        Other   Anemia    Hb 10.9  Fairly stable No symptoms Continue to follow       Estrogen deficiency   Relevant Orders   DG Bone Density   Routine general medical examination at a health care facility - Primary    Reviewed health habits including diet and exercise and skin cancer prevention Reviewed appropriate screening tests for age  Also reviewed  health mt list, fam hx and immunization status , as well as social and family history     See HPI Labs reviewed amw reviewed  Enc a flu shot in the fall Signed up for cologuard program  dexa ordered        Vitamin D deficiency    D level of 50 Vitamin D level is therapeutic with current supplementation Disc importance of this to bone and overall health

## 2018-04-16 NOTE — Assessment & Plan Note (Signed)
Well controlled with metformin Lab Results  Component Value Date   HGBA1C 6.3 04/09/2018   Disc eye and foot care  Doing well  F/u 6 mo

## 2018-04-16 NOTE — Assessment & Plan Note (Signed)
Reviewed health habits including diet and exercise and skin cancer prevention Reviewed appropriate screening tests for age  Also reviewed health mt list, fam hx and immunization status , as well as social and family history   See HPI Labs reviewed amw reviewed  Enc a flu shot in the fall Signed up for cologuard program  dexa ordered

## 2018-04-16 NOTE — Assessment & Plan Note (Signed)
OP 2016 Intol of bisphosphonate  On ca and D D level is tx at 50 No falls or fx Planning next dexa /ref done

## 2018-04-16 NOTE — Assessment & Plan Note (Signed)
D level of 50 Vitamin D level is therapeutic with current supplementation Disc importance of this to bone and overall health

## 2018-04-16 NOTE — Assessment & Plan Note (Signed)
Well controlled Disc goals for lipids and reasons to control them Rev last labs with pt Rev low sat fat diet in detail Continue crestor and diet  Enc exercise

## 2018-04-16 NOTE — Assessment & Plan Note (Signed)
Hb 10.9  Fairly stable No symptoms Continue to follow

## 2018-04-16 NOTE — Assessment & Plan Note (Signed)
bp was better on 2nd check bp in fair control at this time  BP Readings from Last 1 Encounters:  04/16/18 139/60   No changes needed Most recent labs reviewed  Disc lifstyle change with low sodium diet and exercise   Continue losartan hct and metoprolol

## 2018-04-22 ENCOUNTER — Ambulatory Visit (INDEPENDENT_AMBULATORY_CARE_PROVIDER_SITE_OTHER)
Admission: RE | Admit: 2018-04-22 | Discharge: 2018-04-22 | Disposition: A | Discharge status: Home / Self Care | Payer: Medicare Other | Source: Ambulatory Visit | Attending: Family Medicine | Admitting: Family Medicine

## 2018-04-22 DIAGNOSIS — E2839 Other primary ovarian failure: Secondary | ICD-10-CM | POA: Diagnosis not present

## 2018-04-30 ENCOUNTER — Telehealth: Payer: Self-pay | Admitting: *Deleted

## 2018-04-30 ENCOUNTER — Encounter: Payer: Self-pay | Admitting: *Deleted

## 2018-04-30 NOTE — Telephone Encounter (Signed)
error 

## 2018-05-12 ENCOUNTER — Encounter: Payer: Self-pay | Admitting: Family Medicine

## 2018-05-24 LAB — COLOGUARD

## 2018-06-11 ENCOUNTER — Telehealth: Payer: Self-pay | Admitting: Family Medicine

## 2018-06-11 MED ORDER — OLMESARTAN MEDOXOMIL 20 MG PO TABS: 20.0000 mg | ORAL_TABLET | Freq: Every day | ORAL | 3 refills | Status: DC

## 2018-06-11 MED ORDER — HYDROCHLOROTHIAZIDE 25 MG PO TABS: 25.0000 mg | ORAL_TABLET | Freq: Every day | ORAL | 3 refills | Status: DC

## 2018-06-11 NOTE — Telephone Encounter (Signed)
Copied from [place] [phone]. Topic: Quick Communication - Rx Refill/Question >> Jun 11, 2018  9:58 AM Margot Ables wrote: Medication: losartan-hydrochlorothiazide (HYZAAR) 100-25 MG tablet  - pt states that she received a letter from [facility] and a call from [facility] that her medication was recalled - she states that they advised her to continue taking until she is notified otherwise from her doctor - Her medications go to [facility] in 90 day supply -  Please advise.

## 2018-06-11 NOTE — Telephone Encounter (Signed)
Pt notified Rx's sent to pharmacy. 

## 2018-06-11 NOTE — Telephone Encounter (Signed)
Called pharmacy and she said that Losartan by its self is also on recall (all strengths) she said the 2 options they have right now that's not or recall or on a back order are Telmisartan and Olmesartan

## 2018-06-11 NOTE — Telephone Encounter (Signed)
Left VM requesting pt to call the office back, CRM created 

## 2018-06-11 NOTE — Telephone Encounter (Signed)
PT called back

## 2018-06-11 NOTE — Telephone Encounter (Signed)
I lined up to send in the losartan and hctz separately  We or she may need to find out if they have the individual components   I pended the px- send if it sounds like it will work

## 2018-06-11 NOTE — Telephone Encounter (Signed)
I sent olmesartan Thanks

## 2018-06-17 LAB — COLOGUARD

## 2018-07-22 DIAGNOSIS — H401132 Primary open-angle glaucoma, bilateral, moderate stage: Secondary | ICD-10-CM | POA: Diagnosis not present

## 2018-07-22 DIAGNOSIS — H2511 Age-related nuclear cataract, right eye: Secondary | ICD-10-CM | POA: Diagnosis not present

## 2018-07-22 LAB — HM DIABETES EYE EXAM

## 2018-09-03 ENCOUNTER — Telehealth: Payer: Self-pay | Admitting: Family Medicine

## 2018-09-03 NOTE — Telephone Encounter (Signed)
Copied from [place]. Topic: General - Other >> Sep 03, 2018  4:16 PM Valla Leaver wrote: Reason for CRM: Nidhi NP with [facility] did PAD screening with quantaflo machine and pt had severe reading for both legs and so patient needs to be evaluated. Has benn experiencing leg pain for about a year.

## 2018-09-05 NOTE — Telephone Encounter (Signed)
Spoke to pts daughter Anne Ng (on [place]) and offered 1615 appt 09/06/17. Anne Ng states pt is not available and will have her contact office to schedule OV

## 2018-09-06 ENCOUNTER — Encounter: Payer: Self-pay | Admitting: Family Medicine

## 2018-09-06 ENCOUNTER — Ambulatory Visit (INDEPENDENT_AMBULATORY_CARE_PROVIDER_SITE_OTHER): Payer: Medicare Other | Admitting: Family Medicine

## 2018-09-06 VITALS — BP 182/86 | HR 85 | Temp 98.0°F | Ht 60.0 in | Wt 134.5 lb

## 2018-09-06 DIAGNOSIS — I10 Essential (primary) hypertension: Secondary | ICD-10-CM | POA: Diagnosis not present

## 2018-09-06 DIAGNOSIS — E785 Hyperlipidemia, unspecified: Secondary | ICD-10-CM

## 2018-09-06 DIAGNOSIS — I739 Peripheral vascular disease, unspecified: Secondary | ICD-10-CM | POA: Diagnosis not present

## 2018-09-06 DIAGNOSIS — E1169 Type 2 diabetes mellitus with other specified complication: Secondary | ICD-10-CM

## 2018-09-06 DIAGNOSIS — J069 Acute upper respiratory infection, unspecified: Secondary | ICD-10-CM | POA: Diagnosis not present

## 2018-09-06 MED ORDER — OLMESARTAN MEDOXOMIL 40 MG PO TABS: 40.0000 mg | ORAL_TABLET | Freq: Every day | ORAL | 3 refills | Status: DC

## 2018-09-06 MED ORDER — GLUCOSE BLOOD VI STRP: ORAL_STRIP | 3 refills | Status: DC

## 2018-09-06 NOTE — Progress Notes (Addendum)
Subjective:    Patient ID: Kathy Cunningham, female    DOB: 1936/05/25, 83 y.o.   MRN: [id number]  HPI Here for evaluation of legs bp is also elevated   Has also felt bad  She has fluid in her ears (since Sunday)  No fever  Runny and stuffy nose  Headache over front of her head  No otc medicines    She had [facility] house call visit  Did PAD screening with quantaflo machine Had severe reading of both legs  She has had leg pain for about a year   Describes pain in her calves - more in the R than the L  On and off  Does bother her when walking  It stops usually after bed - occasionally bothers her  No swelling    Wt Readings from Last 3 Encounters:  09/06/18 134 lb 8 oz (61 kg)  04/16/18 130 lb 4 oz (59.1 kg)  04/09/18 132 lb (59.9 kg)   26.27 kg/m   HTN BP Readings from Last 3 Encounters:  09/06/18 (!) 182/86  04/16/18 139/60  04/09/18 130/82  metoprolol 100 mg bid (missed her dose last night)  benicar 20 mg daily  hctz 25 mg  klor con 10 meq daily   Pulse Readings from Last 3 Encounters:  09/06/18 85  04/16/18 76  04/09/18 64     Hyperlipidemia Lab Results  Component Value Date   CHOL 137 04/09/2018   HDL 57.70 04/09/2018   LDLCALC 64 04/09/2018   TRIG 77.0 04/09/2018   CHOLHDL 2 04/09/2018   Takes crestor with good control   DM2 Lab Results  Component Value Date   HGBA1C 6.3 04/09/2018   Well controlled with metformin   She has never smoked  Patient Active Problem List   Diagnosis Date Noted  . Viral URI 09/06/2018  . Intermittent claudication ([place]) 09/06/2018  . Initial Medicare annual wellness visit 06/15/2015  . Routine general medical examination at a health care facility 06/15/2015  . Estrogen deficiency 06/15/2015  . Breast cancer screening 12/17/2013  . Tachycardia 04/21/2013  . Other screening mammogram 04/24/2011  . Glaucoma 04/24/2011  . Vitamin D deficiency 04/24/2011  . Anxiety 10/20/2010  . HYPOKALEMIA 10/22/2009  . OTHER  THALASSEMIA 06/10/2008  . Osteoporosis 05/01/2008  . Diabetes type 2, controlled ([place]) 07/17/2007  . Hyperlipidemia associated with type 2 diabetes mellitus ([place]) 07/17/2007  . Anemia 07/17/2007  . Essential hypertension 07/17/2007  . PREMATURE VENTRICULAR CONTRACTIONS, FREQUENT 07/17/2007  . UTI'S, RECURRENT 07/17/2007   Past Medical History:  Diagnosis Date  . Anemia   . Diabetes mellitus    type II  . Dizziness    or vertigo  . Glaucoma   . Hyperlipidemia   . Hypertension    Past Surgical History:  Procedure Laterality Date  . bladder papilloma  2006  . ROTATOR CUFF REPAIR  01/2002  . vaginal hysterectomy     Social History   Tobacco Use  . Smoking status: Never Smoker  . Smokeless tobacco: Never Used  Substance Use Topics  . Alcohol use: No    Alcohol/week: 0.0 standard drinks  . Drug use: No   Family History  Problem Relation Age of Onset  . Heart disease Mother        MI  . Heart attack Mother   . Hypertension Mother   . Heart disease Father        MI  . Heart attack Father   . Hypertension Father   .  Cancer Brother        pancreatic    Allergies  Allergen Reactions  . Atorvastatin Other (See Comments)    REACTION: cramps  . Risedronate Sodium Nausea And Vomiting       . Sulfonamide Derivatives Rash   Current Outpatient Medications on File Prior to Visit  Medication Sig Dispense Refill  . bimatoprost (LUMIGAN) 0.01 % SOLN Place 1 drop into both eyes at bedtime.    . brimonidine (ALPHAGAN P) 0.1 % SOLN Place 1 drop into both eyes 2 (two) times daily.     . calcium citrate (CALCITRATE - DOSED IN MG ELEMENTAL CALCIUM) 950 MG tablet Take 1 tablet by mouth daily.     . cholecalciferol (VITAMIN D) 1000 UNITS tablet Take 2,000 Units by mouth daily.     . fluticasone (FLONASE) 50 MCG/ACT nasal spray PLACE 2 SPRAYS INTO THE NOSE DAILY AS NEEDED. 48 g 3  . hydrochlorothiazide (HYDRODIURIL) 25 MG tablet Take 1 tablet (25 mg total) by mouth daily. 90 tablet  3  . metFORMIN (GLUCOPHAGE) 500 MG tablet Take 1 tablet (500 mg total) by mouth 2 (two) times daily with a meal. 180 tablet 3  . metoprolol tartrate (LOPRESSOR) 100 MG tablet Take 1 tablet (100 mg total) by mouth 2 (two) times daily. 180 tablet 3  . potassium chloride SA (K-DUR,KLOR-CON) 20 MEQ tablet TAKE 1 TABLET (20 MEQ) BY MOUTH  DAILY 90 tablet 3  . rosuvastatin (CRESTOR) 20 MG tablet TAKE 1 TABLET (20 MG) BY MOUTH  DAILY AT NOON 90 tablet 3   No current facility-administered medications on file prior to visit.      Review of Systems  Constitutional: Negative for activity change, appetite change, fatigue, fever and unexpected weight change.  HENT: Positive for congestion, postnasal drip and rhinorrhea. Negative for ear pain, sinus pressure, sinus pain, sore throat, trouble swallowing and voice change.   Eyes: Negative for pain, redness and visual disturbance.  Respiratory: Positive for cough. Negative for shortness of breath and wheezing.        Mild cough  Cardiovascular: Negative for chest pain, palpitations and leg swelling.  Gastrointestinal: Negative for abdominal pain, blood in stool, constipation and diarrhea.  Endocrine: Negative for polydipsia and polyuria.  Genitourinary: Negative for dysuria, frequency and urgency.  Musculoskeletal: Negative for arthralgias, back pain and myalgias.       Leg pain worse with activity    Skin: Negative for pallor and rash.  Allergic/Immunologic: Negative for environmental allergies.  Neurological: Negative for dizziness, syncope and headaches.  Hematological: Negative for adenopathy. Does not bruise/bleed easily.  Psychiatric/Behavioral: Negative for decreased concentration and dysphoric mood. The patient is not nervous/anxious.        Objective:   Physical Exam Constitutional:      General: She is not in acute distress.    Appearance: Normal appearance. She is well-developed. She is not ill-appearing, toxic-appearing or diaphoretic.    HENT:     Head: Normocephalic and atraumatic.     Comments: Nares are injected and congested      Right Ear: Tympanic membrane, ear canal and external ear normal.     Left Ear: Tympanic membrane, ear canal and external ear normal.     Nose: Congestion and rhinorrhea present.     Mouth/Throat:     Mouth: Mucous membranes are moist.     Pharynx: Oropharynx is clear. No oropharyngeal exudate or posterior oropharyngeal erythema.     Comments: Clear pnd  Eyes:  General:        Right eye: No discharge.        Left eye: No discharge.     Conjunctiva/sclera: Conjunctivae normal.     Pupils: Pupils are equal, round, and reactive to light.  Neck:     Musculoskeletal: Normal range of motion and neck supple.     Thyroid: No thyromegaly.     Vascular: No carotid bruit or JVD.  Cardiovascular:     Rate and Rhythm: Normal rate and regular rhythm.     Heart sounds: Normal heart sounds. No gallop.      Comments: DP and PT pulses full in R foot  Much less in the left  Pulmonary:     Effort: Pulmonary effort is normal. No respiratory distress.     Breath sounds: Normal breath sounds. No stridor. No wheezing, rhonchi or rales.     Comments: Good air exch No rales or rhonchi Chest:     Chest wall: No tenderness.  Abdominal:     General: Bowel sounds are normal. There is no distension or abdominal bruit.     Palpations: Abdomen is soft. There is no mass.     Tenderness: There is no abdominal tenderness.  Lymphadenopathy:     Cervical: No cervical adenopathy.  Skin:    General: Skin is warm and dry.     Capillary Refill: Capillary refill takes less than 2 seconds.     Findings: No rash.  Neurological:     General: No focal deficit present.     Mental Status: She is alert. Mental status is at baseline.     Cranial Nerves: No cranial nerve deficit.     Coordination: Coordination normal.     Deep Tendon Reflexes: Reflexes are normal and symmetric. Reflexes normal.  Psychiatric:         Mood and Affect: Mood normal.           Assessment & Plan:   Problem List Items Addressed This Visit      Cardiovascular and Mediastinum   Essential hypertension - Primary    bp trending up BP: (!) 182/86   will inc benicar from 20 to 40 mg daily  Continue metoprolol/hctz and K  Update if problems or symptoms  F/u 2-3 wk  Disc DASH eating       Relevant Medications   olmesartan (BENICAR) 40 MG tablet     Respiratory   Viral URI    Early/mild Recommend inc flonase to bid for 1 wk for congestion  Supportive care  Fluids/rest Update if not starting to improve in a week or if worsening          Endocrine   Hyperlipidemia associated with type 2 diabetes mellitus ([place])    Very good control with statin  Disc goals for lipids and reasons to control them Rev last labs with pt Rev low sat fat diet in detail  LDL is 64 with crestor      Relevant Medications   olmesartan (BENICAR) 40 MG tablet     Other   Intermittent claudication ([place])    Per pt- [facility] housecalls had pos screen for PAD  On exam- strong DP and PT pulses on R foot- not on L  She describes calf pain with walking bilat We will ref for ABI studies  Disc imp of exercise  Working on bp control Cholesterol and DM well controlled

## 2018-09-06 NOTE — Patient Instructions (Addendum)
For ear congestion from a cold- increase flonase to twice daily for a week (then go back to once daily)   For blood pressure increase your generic benicar from 20 to 40 mg daily  The new px will be for the 40 mg pill Follow up in about 2-3 weeks   I will refer you for a vascular study of legs (ABI) We will call you about that

## 2018-09-07 NOTE — Assessment & Plan Note (Signed)
bp trending up BP: (!) 182/86   will inc benicar from 20 to 40 mg daily  Continue metoprolol/hctz and K  Update if problems or symptoms  F/u 2-3 wk  Disc DASH eating

## 2018-09-07 NOTE — Assessment & Plan Note (Signed)
Per pt- [facility] housecalls had pos screen for PAD  On exam- strong DP and PT pulses on R foot- not on L  She describes calf pain with walking bilat We will ref for ABI studies  Disc imp of exercise  Working on bp control Cholesterol and DM well controlled

## 2018-09-07 NOTE — Addendum Note (Signed)
Addended by: Loura Pardon A on: 09/07/2018 03:27 PM   Modules accepted: Orders

## 2018-09-07 NOTE — Assessment & Plan Note (Signed)
Early/mild Recommend inc flonase to bid for 1 wk for congestion  Supportive care  Fluids/rest Update if not starting to improve in a week or if worsening

## 2018-09-07 NOTE — Assessment & Plan Note (Signed)
Very good control with statin  Disc goals for lipids and reasons to control them Rev last labs with pt Rev low sat fat diet in detail  LDL is 64 with crestor

## 2018-09-09 NOTE — Addendum Note (Signed)
Addended by: Loura Pardon A on: 09/09/2018 09:04 AM   Modules accepted: Orders

## 2018-09-13 IMAGING — MG 2D DIGITAL SCREENING BILATERAL MAMMOGRAM WITH 3D TOMO WITH CAD
8 of 13 series · 8 of 29 positions shown · non-contrast
Comparison: Previous exam(s).

CLINICAL DATA: Screening.

EXAM:
2D DIGITAL SCREENING BILATERAL MAMMOGRAM WITH 3D TOMO WITH CAD

[R MLO (1 of 2)]
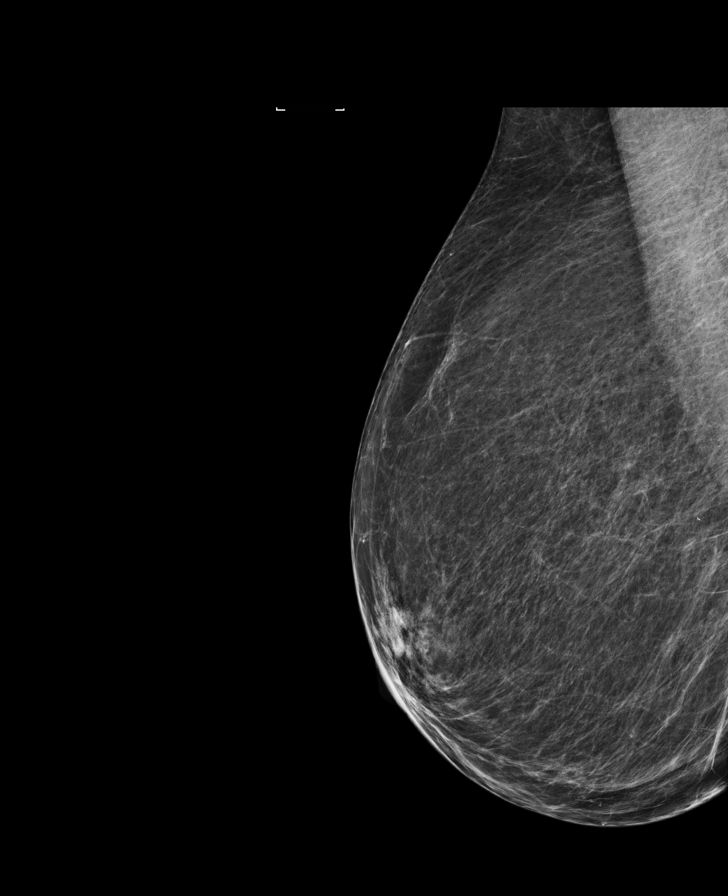

[R MLO (2 of 2)]
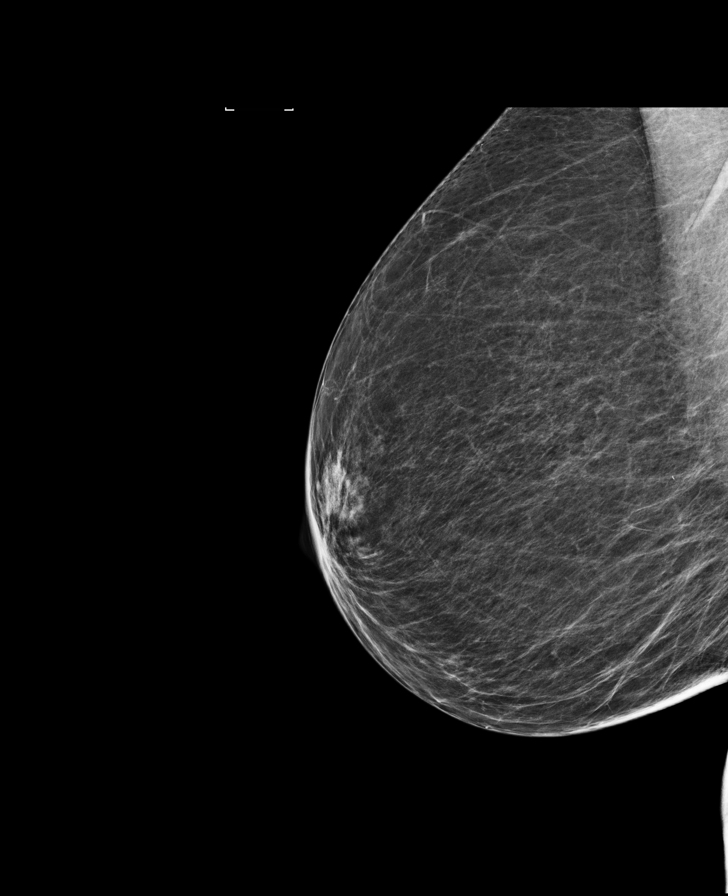

[R CC synth-2D]
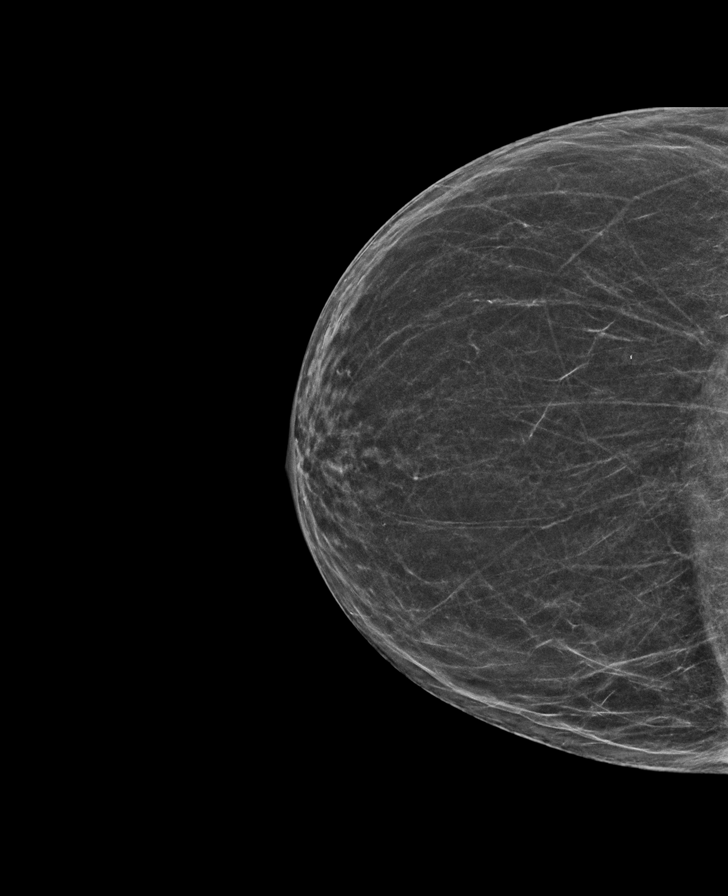

[L MLO]
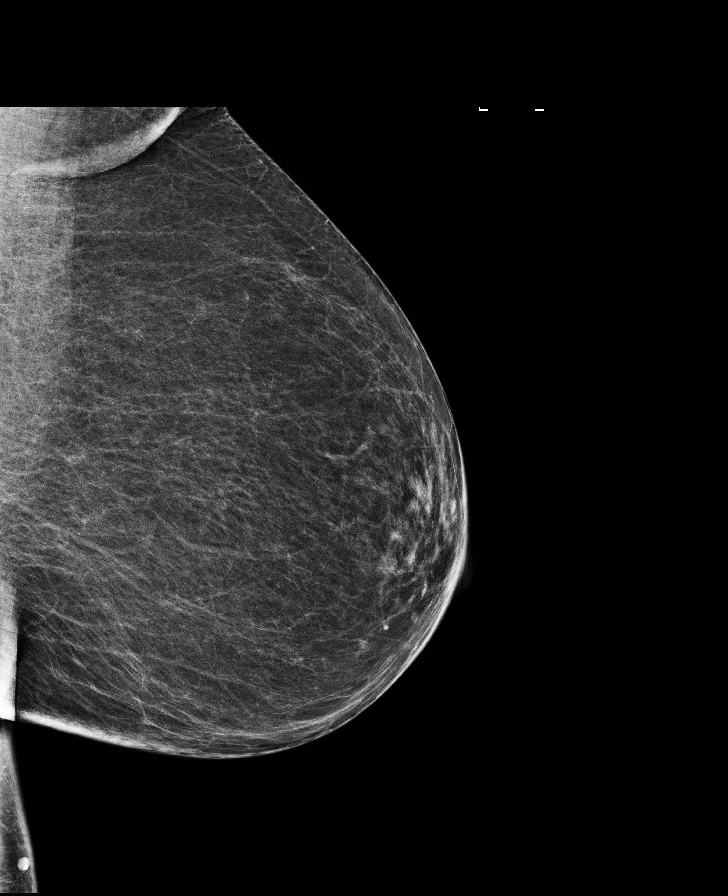

[R MLO synth-2D]
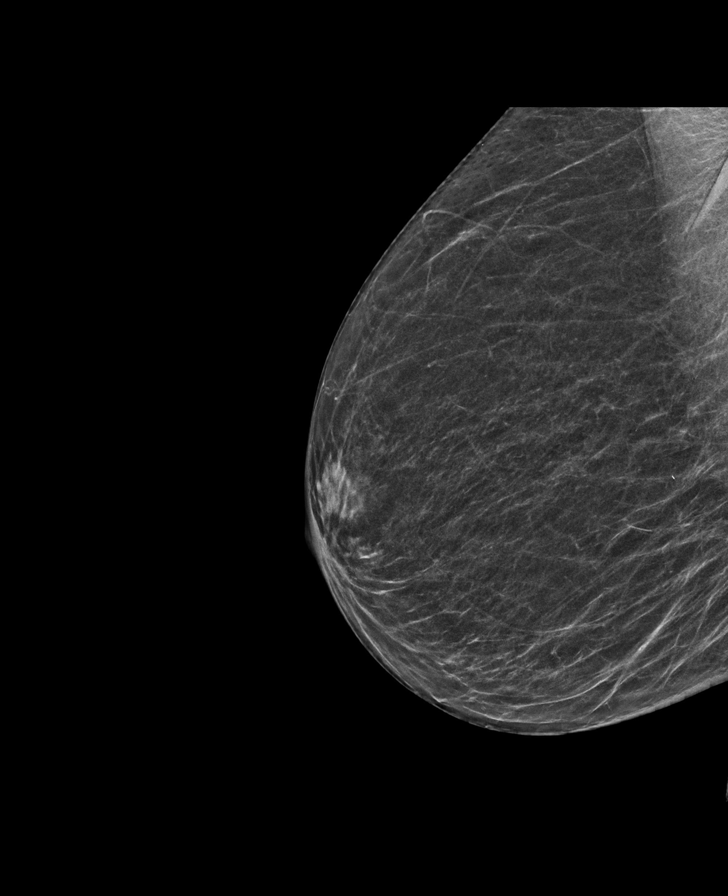

[L MLO synth-2D]
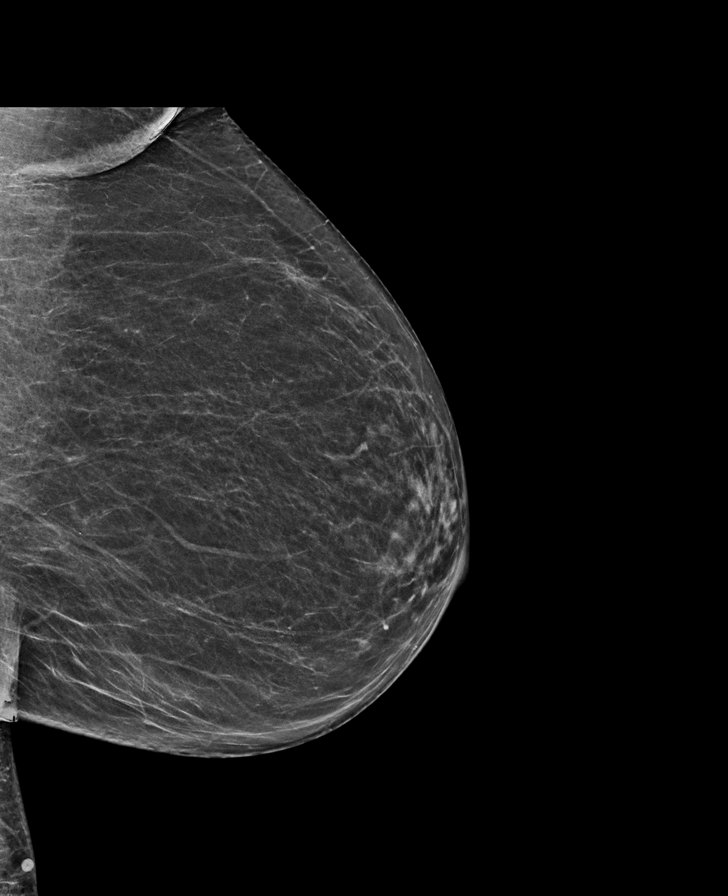

[R CC]
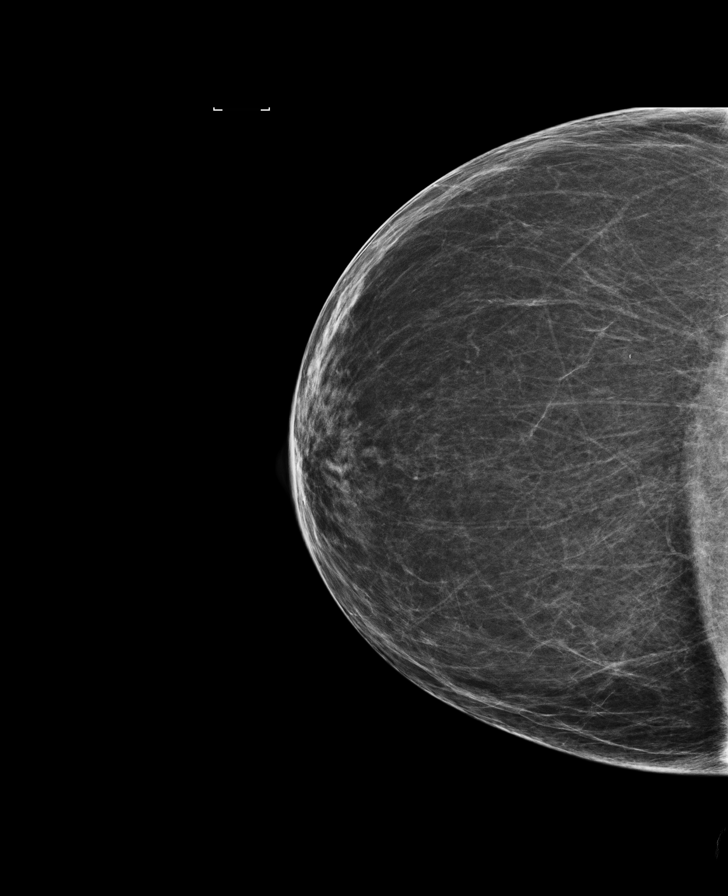

[L CC]
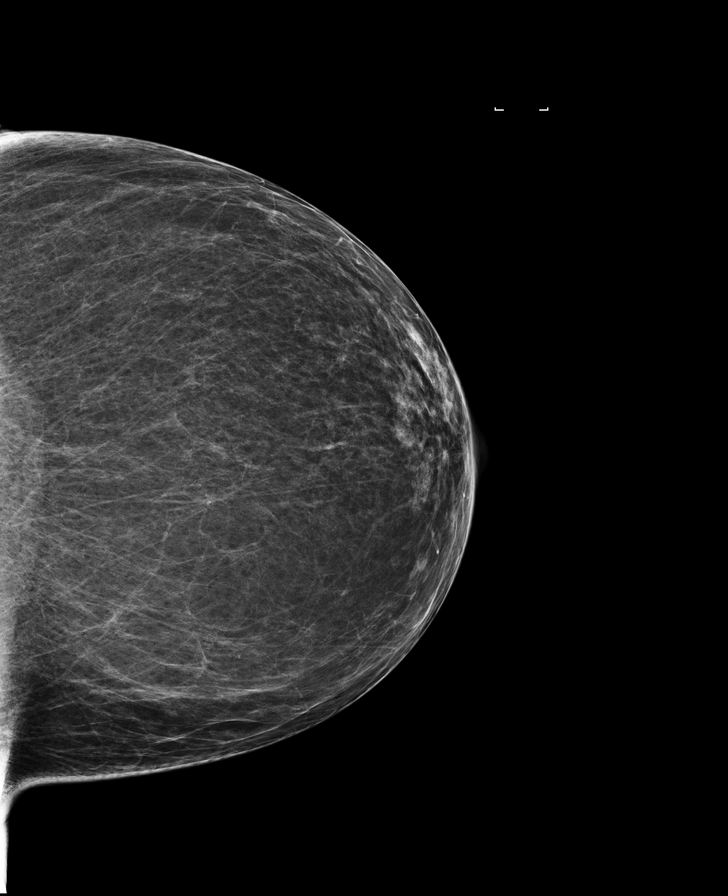

[8 of 29 positions shown; findings below may reference images not displayed]

ACR Breast Density Category b: There are scattered areas of
fibroglandular density.
FINDINGS: There are no findings suspicious for malignancy. Images were
processed with CAD.
IMPRESSION: No mammographic evidence of malignancy. A result letter of this
screening mammogram will be mailed directly to the patient.

RECOMMENDATION:
Screening mammogram in one year. (Code:[id number]-P-ZS0)

BI-RADS CATEGORY  1: Negative.

## 2018-09-20 ENCOUNTER — Ambulatory Visit: Payer: Self-pay | Admitting: Family Medicine

## 2018-09-27 ENCOUNTER — Ambulatory Visit: Payer: Medicare Other

## 2018-09-30 ENCOUNTER — Ambulatory Visit (INDEPENDENT_AMBULATORY_CARE_PROVIDER_SITE_OTHER): Payer: Medicare Other | Admitting: Family Medicine

## 2018-09-30 ENCOUNTER — Encounter: Payer: Self-pay | Admitting: Family Medicine

## 2018-09-30 VITALS — BP 175/80 | HR 70 | Temp 97.9°F | Ht 60.0 in | Wt 131.5 lb

## 2018-09-30 DIAGNOSIS — D568 Other thalassemias: Secondary | ICD-10-CM | POA: Diagnosis not present

## 2018-09-30 DIAGNOSIS — I10 Essential (primary) hypertension: Secondary | ICD-10-CM

## 2018-09-30 DIAGNOSIS — I739 Peripheral vascular disease, unspecified: Secondary | ICD-10-CM

## 2018-09-30 MED ORDER — AMLODIPINE BESYLATE 5 MG PO TABS: 5.0000 mg | ORAL_TABLET | Freq: Every day | ORAL | 3 refills | Status: DC

## 2018-09-30 NOTE — Patient Instructions (Signed)
Blood pressure is still elevated unfortunately   Add amlodipine 5 mg once daily  If any side effects or problems let us know   Get your vascular study when they schedule it   Follow up with [place] in about a month

## 2018-09-30 NOTE — Assessment & Plan Note (Signed)
bp is still elevated (last visit inc benicar dose) No symptoms  BP Readings from Last 1 Encounters:  09/30/18 (!) 175/80  pt states she did eat chinese food last night  Strong fam hx of HTN  Will add amlodipine 5 mg daily  Most recent labs reviewed  Disc lifstyle change with low sodium diet and exercise  F/u in 1 mo  Again stressed imp of lower sodium diet as well

## 2018-09-30 NOTE — Assessment & Plan Note (Signed)
Last Hb close to baseline (with no symptoms) Will continue to monitor Hb 10.9

## 2018-09-30 NOTE — Assessment & Plan Note (Signed)
Pt had appt with vascular for arterial study and the machine broke while she was there  Will have this re scheduled soon  Pend result to make plan  No change in symptoms

## 2018-09-30 NOTE — Progress Notes (Signed)
Subjective:    Patient ID: Kathy Cunningham, female    DOB: 04-16-1936, 83 y.o.   MRN: [id number]  HPI Here for f/u of chronic health problems   Wt Readings from Last 3 Encounters:  09/30/18 131 lb 8 oz (59.6 kg)  09/06/18 134 lb 8 oz (61 kg)  04/16/18 130 lb 4 oz (59.1 kg)   25.68 kg/m   bp is still high today (pt notes she was rushing)  No cp or palpitations or headaches or edema  No side effects to medicines  BP Readings from Last 3 Encounters:  09/30/18 (!) 184/92  09/06/18 (!) 182/86  04/16/18 139/60    Increased her benicar from 40 mg daily  She has not checked at home   bp meds : hctz 25 mg  Metoprolol 100 mg bid  benicar 40 mg   Pulse Readings from Last 3 Encounters:  09/30/18 70  09/06/18 85  04/16/18 76   Lab Results  Component Value Date   CREATININE 0.95 04/09/2018   BUN 15 04/09/2018   NA 139 04/09/2018   K 4.0 04/09/2018   CL 102 04/09/2018   CO2 31 04/09/2018  also takes K   Lab Results  Component Value Date   WBC 5.1 04/09/2018   HGB 10.9 (L) 04/09/2018   HCT 32.7 (L) 04/09/2018   MCV 93.0 04/09/2018   PLT 288.0 04/09/2018   h/o thalassemia  At baseline   Ref for ABI studies (abn screening with [facility])  Some claudication  She went for the test on 1/24 and unfortunately the machine broke  Planning to re schedule that   Patient Active Problem List   Diagnosis Date Noted  . Intermittent claudication ([place]) 09/06/2018  . Initial Medicare annual wellness visit 06/15/2015  . Routine general medical examination at a health care facility 06/15/2015  . Estrogen deficiency 06/15/2015  . Breast cancer screening 12/17/2013  . Other screening mammogram 04/24/2011  . Glaucoma 04/24/2011  . Vitamin D deficiency 04/24/2011  . Anxiety 10/20/2010  . HYPOKALEMIA 10/22/2009  . OTHER THALASSEMIA 06/10/2008  . Osteoporosis 05/01/2008  . Diabetes type 2, controlled ([name]) 07/17/2007  . Hyperlipidemia associated with type 2 diabetes mellitus ([place])  07/17/2007  . Anemia 07/17/2007  . Essential hypertension 07/17/2007  . PREMATURE VENTRICULAR CONTRACTIONS, FREQUENT 07/17/2007  . UTI'S, RECURRENT 07/17/2007   Past Medical History:  Diagnosis Date  . Anemia   . Diabetes mellitus    type II  . Dizziness    or vertigo  . Glaucoma   . Hyperlipidemia   . Hypertension    Past Surgical History:  Procedure Laterality Date  . bladder papilloma  2006  . ROTATOR CUFF REPAIR  01/2002  . vaginal hysterectomy     Social History   Tobacco Use  . Smoking status: Never Smoker  . Smokeless tobacco: Never Used  Substance Use Topics  . Alcohol use: No    Alcohol/week: 0.0 standard drinks  . Drug use: No   Family History  Problem Relation Age of Onset  . Heart disease Mother        MI  . Heart attack Mother   . Hypertension Mother   . Heart disease Father        MI  . Heart attack Father   . Hypertension Father   . Cancer Brother        pancreatic    Allergies  Allergen Reactions  . Atorvastatin Other (See Comments)    REACTION: cramps  .  Risedronate Sodium Nausea And Vomiting       . Sulfonamide Derivatives Rash   Current Outpatient Medications on File Prior to Visit  Medication Sig Dispense Refill  . bimatoprost (LUMIGAN) 0.01 % SOLN Place 1 drop into both eyes at bedtime.    . brimonidine (ALPHAGAN P) 0.1 % SOLN Place 1 drop into both eyes 2 (two) times daily.     . calcium citrate (CALCITRATE - DOSED IN MG ELEMENTAL CALCIUM) 950 MG tablet Take 1 tablet by mouth daily.     . cholecalciferol (VITAMIN D) 1000 UNITS tablet Take 2,000 Units by mouth daily.     . fluticasone (FLONASE) 50 MCG/ACT nasal spray PLACE 2 SPRAYS INTO THE NOSE DAILY AS NEEDED. 48 g 3  . glucose blood test strip To check glucose QD and prn for diabetes 100 each 3  . hydrochlorothiazide (HYDRODIURIL) 25 MG tablet Take 1 tablet (25 mg total) by mouth daily. 90 tablet 3  . metFORMIN (GLUCOPHAGE) 500 MG tablet Take 1 tablet (500 mg total) by mouth 2  (two) times daily with a meal. 180 tablet 3  . metoprolol tartrate (LOPRESSOR) 100 MG tablet Take 1 tablet (100 mg total) by mouth 2 (two) times daily. 180 tablet 3  . olmesartan (BENICAR) 40 MG tablet Take 1 tablet (40 mg total) by mouth daily. 90 tablet 3  . potassium chloride SA (K-DUR,KLOR-CON) 20 MEQ tablet TAKE 1 TABLET (20 MEQ) BY MOUTH  DAILY 90 tablet 3  . rosuvastatin (CRESTOR) 20 MG tablet TAKE 1 TABLET (20 MG) BY MOUTH  DAILY AT NOON 90 tablet 3   No current facility-administered medications on file prior to visit.       Review of Systems  Constitutional: Negative for activity change, appetite change, fatigue, fever and unexpected weight change.  HENT: Negative for congestion, ear pain, rhinorrhea, sinus pressure and sore throat.   Eyes: Negative for pain, redness and visual disturbance.  Respiratory: Negative for cough, shortness of breath and wheezing.   Cardiovascular: Negative for chest pain, palpitations and leg swelling.       Intermittent leg pain   Gastrointestinal: Negative for abdominal pain, blood in stool, constipation and diarrhea.  Endocrine: Negative for polydipsia and polyuria.  Genitourinary: Negative for dysuria, frequency and urgency.  Musculoskeletal: Negative for arthralgias, back pain and myalgias.  Skin: Negative for pallor and rash.  Allergic/Immunologic: Negative for environmental allergies.  Neurological: Negative for dizziness, syncope and headaches.  Hematological: Negative for adenopathy. Does not bruise/bleed easily.  Psychiatric/Behavioral: Negative for decreased concentration and dysphoric mood. The patient is not nervous/anxious.        Objective:   Physical Exam Constitutional:      General: She is not in acute distress.    Appearance: Normal appearance. She is well-developed and normal weight.  HENT:     Head: Normocephalic and atraumatic.  Eyes:     Conjunctiva/sclera: Conjunctivae normal.     Pupils: Pupils are equal, round, and  reactive to light.  Neck:     Musculoskeletal: Normal range of motion and neck supple.     Thyroid: No thyromegaly.     Vascular: No carotid bruit or JVD.  Cardiovascular:     Rate and Rhythm: Normal rate and regular rhythm.     Heart sounds: Normal heart sounds. No murmur. No gallop.   Pulmonary:     Effort: Pulmonary effort is normal. No respiratory distress.     Breath sounds: Normal breath sounds. No wheezing or rales.  Abdominal:  General: Abdomen is flat. There is no abdominal bruit.     Palpations: Abdomen is soft. There is no mass.  Lymphadenopathy:     Cervical: No cervical adenopathy.  Skin:    General: Skin is warm and dry.     Findings: No rash.  Neurological:     General: No focal deficit present.     Mental Status: She is alert.     Deep Tendon Reflexes: Reflexes are normal and symmetric. Reflexes normal.     Comments: No tremor   Psychiatric:        Mood and Affect: Mood normal.           Assessment & Plan:   Problem List Items Addressed This Visit      Cardiovascular and Mediastinum   Essential hypertension - Primary    bp is still elevated (last visit inc benicar dose) No symptoms  BP Readings from Last 1 Encounters:  09/30/18 (!) 175/80  pt states she did eat chinese food last night  Strong fam hx of HTN  Will add amlodipine 5 mg daily  Most recent labs reviewed  Disc lifstyle change with low sodium diet and exercise  F/u in 1 mo  Again stressed imp of lower sodium diet as well       Relevant Medications   amLODipine (NORVASC) 5 MG tablet     Other   OTHER THALASSEMIA    Last Hb close to baseline (with no symptoms) Will continue to monitor Hb 10.9      Intermittent claudication ([facility])    Pt had appt with vascular for arterial study and the machine broke while she was there  Will have this re scheduled soon  Pend result to make plan  No change in symptoms

## 2018-10-08 ENCOUNTER — Ambulatory Visit (INDEPENDENT_AMBULATORY_CARE_PROVIDER_SITE_OTHER): Payer: Medicare Other

## 2018-10-08 DIAGNOSIS — I739 Peripheral vascular disease, unspecified: Secondary | ICD-10-CM | POA: Diagnosis not present

## 2018-10-10 ENCOUNTER — Telehealth: Payer: Self-pay | Admitting: *Deleted

## 2018-10-10 NOTE — Telephone Encounter (Signed)
Addressed through results notes  

## 2018-10-10 NOTE — Telephone Encounter (Signed)
Left VM requesting pt to call the office back regarding imaging results  

## 2018-10-15 ENCOUNTER — Other Ambulatory Visit: Payer: Self-pay

## 2018-10-18 ENCOUNTER — Ambulatory Visit: Payer: Self-pay | Admitting: Family Medicine

## 2018-10-25 ENCOUNTER — Telehealth: Payer: Self-pay

## 2018-10-25 ENCOUNTER — Other Ambulatory Visit: Payer: Self-pay

## 2018-10-25 NOTE — Telephone Encounter (Signed)
I spoke to patient and confirmed her appointment for lab work on 10/28/18 and appointment with Dr.Tower on 10/29/18.

## 2018-10-25 NOTE — Telephone Encounter (Signed)
[facility] Night - Client Nonclinical Telephone Record [place] Night - Client Client Site [place] - Night Physician [facility] - PHYSICIAN, NOT LISTED- MD Contact Type Call Who Is Calling Patient / Member / Family / Caregiver Caller Name [place] Phone Number [phone] Call Type Message Only Information Provided Reason for Call Returning a Call from the Office Initial Comment Caller States he is returning a call from the office about her apt on Monday or tuesday. Additional Comment Call Closed By: Mariann Laster Transaction Date/Time: 10/24/2018 5:39:08 PM (ET)

## 2018-10-28 ENCOUNTER — Other Ambulatory Visit (INDEPENDENT_AMBULATORY_CARE_PROVIDER_SITE_OTHER): Payer: Medicare Other

## 2018-10-28 DIAGNOSIS — I10 Essential (primary) hypertension: Secondary | ICD-10-CM | POA: Diagnosis not present

## 2018-10-28 DIAGNOSIS — E119 Type 2 diabetes mellitus without complications: Secondary | ICD-10-CM | POA: Diagnosis not present

## 2018-10-28 DIAGNOSIS — E1169 Type 2 diabetes mellitus with other specified complication: Secondary | ICD-10-CM

## 2018-10-28 DIAGNOSIS — E785 Hyperlipidemia, unspecified: Secondary | ICD-10-CM

## 2018-10-28 LAB — LIPID PANEL
Cholesterol: 160 mg/dL (ref 0–200)
HDL: 71.1 mg/dL (ref >39.00)
LDL CALC: 74 mg/dL (ref 0–99)
NonHDL: 89.39
Total CHOL/HDL Ratio: 2
Triglycerides: 79 mg/dL (ref 0.0–149.0)
VLDL: 15.8 mg/dL (ref 0.0–40.0)

## 2018-10-28 LAB — COMPREHENSIVE METABOLIC PANEL
ALT: 11 U/L (ref 0–35)
AST: 20 U/L (ref 0–37)
Albumin: 4.3 g/dL (ref 3.5–5.2)
Alkaline Phosphatase: 50 U/L (ref 39–117)
BUN: 18 mg/dL (ref 6–23)
CHLORIDE: 101 meq/L (ref 96–112)
CO2: 33 mEq/L — ABNORMAL HIGH (ref 19–32)
Calcium: 9.8 mg/dL (ref 8.4–10.5)
Creatinine, Ser: 1.06 mg/dL (ref 0.40–1.20)
GFR: 60.21 mL/min (ref >60.00)
Glucose, Bld: 122 mg/dL — ABNORMAL HIGH (ref 70–99)
POTASSIUM: 3.8 meq/L (ref 3.5–5.1)
Sodium: 140 mEq/L (ref 135–145)
Total Bilirubin: 0.6 mg/dL (ref 0.2–1.2)
Total Protein: 7 g/dL (ref 6.0–8.3)

## 2018-10-28 LAB — HEMOGLOBIN A1C: Hgb A1c MFr Bld: 6.3 % (ref 4.6–6.5)

## 2018-10-28 NOTE — Telephone Encounter (Signed)
[facility] Night - Client Nonclinical Telephone Record [facility] Night - Client Client Site [place] - Night Physician [facility] - PHYSICIAN, NOT LISTED- MD Contact Type Call Who Is Calling Patient / Member / Family / Caregiver Caller Name [place] Phone Number [phone] Call Type Message Only Information Provided Reason for Call Returning a Call from the Office Initial Comment Caller States he is returning a call from the office about her apt on Monday or tuesday. Additional Comment Call Closed By: Mariann Laster Transaction Date/Time: 10/24/2018 5:39:08 PM (ET)

## 2018-10-28 NOTE — Telephone Encounter (Signed)
This appears to be a duplicate note which is already been addressed in this phone note.

## 2018-10-29 ENCOUNTER — Ambulatory Visit (INDEPENDENT_AMBULATORY_CARE_PROVIDER_SITE_OTHER): Payer: Medicare Other | Admitting: Family Medicine

## 2018-10-29 ENCOUNTER — Encounter: Payer: Self-pay | Admitting: Family Medicine

## 2018-10-29 VITALS — BP 138/68 | HR 66 | Temp 97.4°F | Ht 60.0 in | Wt 132.6 lb

## 2018-10-29 DIAGNOSIS — I10 Essential (primary) hypertension: Secondary | ICD-10-CM

## 2018-10-29 DIAGNOSIS — E785 Hyperlipidemia, unspecified: Secondary | ICD-10-CM

## 2018-10-29 DIAGNOSIS — E1169 Type 2 diabetes mellitus with other specified complication: Secondary | ICD-10-CM

## 2018-10-29 DIAGNOSIS — E119 Type 2 diabetes mellitus without complications: Secondary | ICD-10-CM

## 2018-10-29 NOTE — Assessment & Plan Note (Signed)
Lab Results  Component Value Date   HGBA1C 6.3 10/28/2018   Stable on metformin  Arb for renal protection  Nl foot exam  utd eye exam  Enc continued good health habits F/u 6 mo

## 2018-10-29 NOTE — Patient Instructions (Signed)
Labs and blood pressure look good  Stay active  Take care of yourself  No medicine changes

## 2018-10-29 NOTE — Progress Notes (Signed)
Subjective:    Patient ID: Kathy Cunningham, female    DOB: June 16, 1936, 83 y.o.   MRN: [id number]  HPI Here for f/u of chronic health problems   Wt Readings from Last 3 Encounters:  10/29/18 132 lb 9 oz (60.1 kg)  09/30/18 131 lb 8 oz (59.6 kg)  09/06/18 134 lb 8 oz (61 kg)   25.89 kg/m   bp is stable today  No cp or palpitations or headaches or edema  No side effects to medicines  BP Readings from Last 3 Encounters:  10/29/18 138/68  09/30/18 (!) 175/80  09/06/18 (!) 182/86    Last visit we added amlodipine 5 mg and disc lower sodium diet  No side effects or problems  In fact she feels very good - more energy and more active   Lab Results  Component Value Date   CREATININE 1.06 10/28/2018   BUN 18 10/28/2018   NA 140 10/28/2018   K 3.8 10/28/2018   CL 101 10/28/2018   CO2 33 (H) 10/28/2018   Hyperlipidemia Lab Results  Component Value Date   CHOL 160 10/28/2018   CHOL 137 04/09/2018   CHOL 136 10/09/2017   Lab Results  Component Value Date   HDL 71.10 10/28/2018   HDL 57.70 04/09/2018   HDL 71.20 10/09/2017   Lab Results  Component Value Date   LDLCALC 74 10/28/2018   LDLCALC 64 04/09/2018   LDLCALC 51 10/09/2017   Lab Results  Component Value Date   TRIG 79.0 10/28/2018   TRIG 77.0 04/09/2018   TRIG 73.0 10/09/2017   Lab Results  Component Value Date   CHOLHDL 2 10/28/2018   CHOLHDL 2 04/09/2018   CHOLHDL 2 10/09/2017   No results found for: LDLDIRECT  Taking crestor 20 mg    DM2 Lab Results  Component Value Date   HGBA1C 6.3 10/28/2018  arb for renal protection  This is stable from last check  Taking metformin  Eye exam 11/19 with no retinopathy  Trying to eat well   Patient Active Problem List   Diagnosis Date Noted  . Intermittent claudication ([place]) 09/06/2018  . Initial Medicare annual wellness visit 06/15/2015  . Routine general medical examination at a health care facility 06/15/2015  . Estrogen deficiency 06/15/2015  .  Breast cancer screening 12/17/2013  . Other screening mammogram 04/24/2011  . Glaucoma 04/24/2011  . Vitamin D deficiency 04/24/2011  . Anxiety 10/20/2010  . HYPOKALEMIA 10/22/2009  . OTHER THALASSEMIA 06/10/2008  . Osteoporosis 05/01/2008  . Diabetes type 2, controlled ([place]) 07/17/2007  . Hyperlipidemia associated with type 2 diabetes mellitus ([place]) 07/17/2007  . Anemia 07/17/2007  . Essential hypertension 07/17/2007  . PREMATURE VENTRICULAR CONTRACTIONS, FREQUENT 07/17/2007  . UTI'S, RECURRENT 07/17/2007   Past Medical History:  Diagnosis Date  . Anemia   . Diabetes mellitus    type II  . Dizziness    or vertigo  . Glaucoma   . Hyperlipidemia   . Hypertension    Past Surgical History:  Procedure Laterality Date  . bladder papilloma  2006  . ROTATOR CUFF REPAIR  01/2002  . vaginal hysterectomy     Social History   Tobacco Use  . Smoking status: Never Smoker  . Smokeless tobacco: Never Used  Substance Use Topics  . Alcohol use: No    Alcohol/week: 0.0 standard drinks  . Drug use: No   Family History  Problem Relation Age of Onset  . Heart disease Mother  MI  . Heart attack Mother   . Hypertension Mother   . Heart disease Father        MI  . Heart attack Father   . Hypertension Father   . Cancer Brother        pancreatic    Allergies  Allergen Reactions  . Atorvastatin Other (See Comments)    REACTION: cramps  . Risedronate Sodium Nausea And Vomiting       . Sulfonamide Derivatives Rash   Current Outpatient Medications on File Prior to Visit  Medication Sig Dispense Refill  . amLODipine (NORVASC) 5 MG tablet Take 1 tablet (5 mg total) by mouth daily. 90 tablet 3  . bimatoprost (LUMIGAN) 0.01 % SOLN Place 1 drop into both eyes at bedtime.    . brimonidine (ALPHAGAN P) 0.1 % SOLN Place 1 drop into both eyes 2 (two) times daily.     . calcium citrate (CALCITRATE - DOSED IN MG ELEMENTAL CALCIUM) 950 MG tablet Take 1 tablet by mouth daily.     .  cholecalciferol (VITAMIN D) 1000 UNITS tablet Take 2,000 Units by mouth daily.     . fluticasone (FLONASE) 50 MCG/ACT nasal spray PLACE 2 SPRAYS INTO THE NOSE DAILY AS NEEDED. 48 g 3  . glucose blood test strip To check glucose QD and prn for diabetes 100 each 3  . hydrochlorothiazide (HYDRODIURIL) 25 MG tablet Take 1 tablet (25 mg total) by mouth daily. 90 tablet 3  . metFORMIN (GLUCOPHAGE) 500 MG tablet Take 1 tablet (500 mg total) by mouth 2 (two) times daily with a meal. 180 tablet 3  . metoprolol tartrate (LOPRESSOR) 100 MG tablet Take 1 tablet (100 mg total) by mouth 2 (two) times daily. 180 tablet 3  . olmesartan (BENICAR) 40 MG tablet Take 1 tablet (40 mg total) by mouth daily. 90 tablet 3  . potassium chloride SA (K-DUR,KLOR-CON) 20 MEQ tablet TAKE 1 TABLET (20 MEQ) BY MOUTH  DAILY 90 tablet 3  . rosuvastatin (CRESTOR) 20 MG tablet TAKE 1 TABLET (20 MG) BY MOUTH  DAILY AT NOON 90 tablet 3   No current facility-administered medications on file prior to visit.      Review of Systems  Constitutional: Negative for activity change, appetite change, fatigue, fever and unexpected weight change.  HENT: Negative for congestion, ear pain, rhinorrhea, sinus pressure and sore throat.   Eyes: Negative for pain, redness and visual disturbance.  Respiratory: Negative for cough, shortness of breath and wheezing.   Cardiovascular: Negative for chest pain and palpitations.  Gastrointestinal: Negative for abdominal pain, blood in stool, constipation and diarrhea.  Endocrine: Negative for polydipsia and polyuria.  Genitourinary: Negative for dysuria, frequency and urgency.  Musculoskeletal: Negative for arthralgias, back pain and myalgias.  Skin: Negative for pallor and rash.  Allergic/Immunologic: Negative for environmental allergies.  Neurological: Negative for dizziness, syncope and headaches.  Hematological: Negative for adenopathy. Does not bruise/bleed easily.  Psychiatric/Behavioral: Negative  for decreased concentration and dysphoric mood. The patient is not nervous/anxious.        Objective:   Physical Exam Constitutional:      General: She is not in acute distress.    Appearance: Normal appearance. She is well-developed and normal weight.  HENT:     Head: Normocephalic and atraumatic.     Mouth/Throat:     Mouth: Mucous membranes are moist.     Pharynx: Oropharynx is clear.  Eyes:     Conjunctiva/sclera: Conjunctivae normal.     Pupils:  Pupils are equal, round, and reactive to light.  Neck:     Musculoskeletal: Normal range of motion and neck supple.     Thyroid: No thyromegaly.     Vascular: No carotid bruit or JVD.  Cardiovascular:     Rate and Rhythm: Normal rate and regular rhythm.     Heart sounds: Normal heart sounds. No gallop.   Pulmonary:     Effort: Pulmonary effort is normal. No respiratory distress.     Breath sounds: Normal breath sounds. No wheezing or rales.  Abdominal:     General: Bowel sounds are normal. There is no distension or abdominal bruit.     Palpations: Abdomen is soft. There is no mass.     Tenderness: There is no abdominal tenderness.  Lymphadenopathy:     Cervical: No cervical adenopathy.  Skin:    General: Skin is warm and dry.     Findings: No rash.  Neurological:     General: No focal deficit present.     Mental Status: She is alert.     Deep Tendon Reflexes: Reflexes are normal and symmetric.  Psychiatric:        Mood and Affect: Mood normal.           Assessment & Plan:   Problem List Items Addressed This Visit      Cardiovascular and Mediastinum   Essential hypertension - Primary    Improved with the addition of amlodipine  bp in fair control at this time  BP Readings from Last 1 Encounters:  10/29/18 138/68   No changes needed Most recent labs reviewed  Disc lifstyle change with low sodium diet and exercise          Endocrine   Diabetes type 2, controlled ([facility])    Lab Results  Component Value Date    HGBA1C 6.3 10/28/2018   Stable on metformin  Arb for renal protection  Nl foot exam  utd eye exam  Enc continued good health habits F/u 6 mo      Hyperlipidemia associated with type 2 diabetes mellitus ([place])    Well controlled with crestor and diet  Disc goals for lipids and reasons to control them Rev last labs with pt Rev low sat fat diet in detail

## 2018-10-29 NOTE — Assessment & Plan Note (Signed)
Well controlled with crestor and diet  Disc goals for lipids and reasons to control them Rev last labs with pt Rev low sat fat diet in detail

## 2018-10-29 NOTE — Assessment & Plan Note (Signed)
Improved with the addition of amlodipine  bp in fair control at this time  BP Readings from Last 1 Encounters:  10/29/18 138/68   No changes needed Most recent labs reviewed  Disc lifstyle change with low sodium diet and exercise

## 2018-11-07 ENCOUNTER — Encounter: Payer: Self-pay | Admitting: Internal Medicine

## 2018-11-25 ENCOUNTER — Other Ambulatory Visit: Payer: Self-pay | Admitting: Family Medicine

## 2018-11-25 DIAGNOSIS — Z1231 Encounter for screening mammogram for malignant neoplasm of breast: Secondary | ICD-10-CM

## 2019-01-06 ENCOUNTER — Ambulatory Visit: Payer: Self-pay

## 2019-01-06 ENCOUNTER — Other Ambulatory Visit: Payer: Self-pay

## 2019-01-06 ENCOUNTER — Ambulatory Visit
Admission: RE | Admit: 2019-01-06 | Discharge: 2019-01-06 | Disposition: A | Discharge status: Home / Self Care | Payer: Medicare Other | Source: Ambulatory Visit | Attending: Family Medicine | Admitting: Family Medicine

## 2019-01-06 DIAGNOSIS — Z1231 Encounter for screening mammogram for malignant neoplasm of breast: Secondary | ICD-10-CM

## 2019-01-09 ENCOUNTER — Encounter: Payer: Self-pay | Admitting: *Deleted

## 2019-01-30 DIAGNOSIS — H401132 Primary open-angle glaucoma, bilateral, moderate stage: Secondary | ICD-10-CM | POA: Diagnosis not present

## 2019-02-06 ENCOUNTER — Ambulatory Visit: Payer: Self-pay

## 2019-03-20 ENCOUNTER — Other Ambulatory Visit: Payer: Self-pay | Admitting: Family Medicine

## 2019-03-28 ENCOUNTER — Other Ambulatory Visit: Payer: Self-pay | Admitting: Family Medicine

## 2019-04-05 ENCOUNTER — Other Ambulatory Visit: Payer: Self-pay | Admitting: Family Medicine

## 2019-04-13 ENCOUNTER — Telehealth: Payer: Self-pay | Admitting: Family Medicine

## 2019-04-13 DIAGNOSIS — M810 Age-related osteoporosis without current pathological fracture: Secondary | ICD-10-CM

## 2019-04-13 DIAGNOSIS — E559 Vitamin D deficiency, unspecified: Secondary | ICD-10-CM

## 2019-04-13 DIAGNOSIS — E785 Hyperlipidemia, unspecified: Secondary | ICD-10-CM

## 2019-04-13 DIAGNOSIS — E1169 Type 2 diabetes mellitus with other specified complication: Secondary | ICD-10-CM

## 2019-04-13 DIAGNOSIS — E119 Type 2 diabetes mellitus without complications: Secondary | ICD-10-CM

## 2019-04-13 DIAGNOSIS — I10 Essential (primary) hypertension: Secondary | ICD-10-CM

## 2019-04-13 NOTE — Telephone Encounter (Signed)
-----   Message from Ellamae Sia sent at 04/07/2019  3:28 PM EDT ----- Regarding: Lab orders for Tuesday, 8.11.20  AWV lab orders, please.

## 2019-04-14 ENCOUNTER — Ambulatory Visit: Payer: Medicare Other

## 2019-04-14 ENCOUNTER — Ambulatory Visit: Payer: Self-pay

## 2019-04-15 ENCOUNTER — Other Ambulatory Visit: Payer: Self-pay

## 2019-04-15 ENCOUNTER — Other Ambulatory Visit (INDEPENDENT_AMBULATORY_CARE_PROVIDER_SITE_OTHER): Payer: Medicare Other

## 2019-04-15 DIAGNOSIS — E785 Hyperlipidemia, unspecified: Secondary | ICD-10-CM

## 2019-04-15 DIAGNOSIS — E1169 Type 2 diabetes mellitus with other specified complication: Secondary | ICD-10-CM | POA: Diagnosis not present

## 2019-04-15 DIAGNOSIS — I10 Essential (primary) hypertension: Secondary | ICD-10-CM | POA: Diagnosis not present

## 2019-04-15 DIAGNOSIS — E119 Type 2 diabetes mellitus without complications: Secondary | ICD-10-CM | POA: Diagnosis not present

## 2019-04-15 DIAGNOSIS — E559 Vitamin D deficiency, unspecified: Secondary | ICD-10-CM

## 2019-04-15 LAB — COMPREHENSIVE METABOLIC PANEL
ALT: 8 U/L (ref 0–35)
AST: 15 U/L (ref 0–37)
Albumin: 4.1 g/dL (ref 3.5–5.2)
Alkaline Phosphatase: 50 U/L (ref 39–117)
BUN: 19 mg/dL (ref 6–23)
CO2: 30 mEq/L (ref 19–32)
Calcium: 9.4 mg/dL (ref 8.4–10.5)
Chloride: 100 mEq/L (ref 96–112)
Creatinine, Ser: 1.03 mg/dL (ref 0.40–1.20)
GFR: 62.16 mL/min (ref >60.00)
Glucose, Bld: 115 mg/dL — ABNORMAL HIGH (ref 70–99)
Potassium: 4 mEq/L (ref 3.5–5.1)
Sodium: 136 mEq/L (ref 135–145)
Total Bilirubin: 0.5 mg/dL (ref 0.2–1.2)
Total Protein: 6.3 g/dL (ref 6.0–8.3)

## 2019-04-15 LAB — CBC WITH DIFFERENTIAL/PLATELET
Basophils Absolute: 0.1 10*3/uL (ref 0.0–0.1)
Basophils Relative: 1.2 % (ref 0.0–3.0)
Eosinophils Absolute: 0.2 10*3/uL (ref 0.0–0.7)
Eosinophils Relative: 4 % (ref 0.0–5.0)
HCT: 30.9 % — ABNORMAL LOW (ref 36.0–46.0)
Hemoglobin: 10.3 g/dL — ABNORMAL LOW (ref 12.0–15.0)
Lymphocytes Relative: 36 % (ref 12.0–46.0)
Lymphs Abs: 2.1 10*3/uL (ref 0.7–4.0)
MCHC: 33.5 g/dL (ref 30.0–36.0)
MCV: 94.8 fl (ref 78.0–100.0)
Monocytes Absolute: 0.6 10*3/uL (ref 0.1–1.0)
Monocytes Relative: 10.4 % (ref 3.0–12.0)
Neutro Abs: 2.8 10*3/uL (ref 1.4–7.7)
Neutrophils Relative %: 48.4 % (ref 43.0–77.0)
Platelets: 256 10*3/uL (ref 150.0–400.0)
RBC: 3.26 Mil/uL — ABNORMAL LOW (ref 3.87–5.11)
RDW: 13.2 % (ref 11.5–15.5)
WBC: 5.7 10*3/uL (ref 4.0–10.5)

## 2019-04-15 LAB — HEMOGLOBIN A1C: Hgb A1c MFr Bld: 6.3 % (ref 4.6–6.5)

## 2019-04-15 LAB — VITAMIN D 25 HYDROXY (VIT D DEFICIENCY, FRACTURES): VITD: 43.24 ng/mL (ref 30.00–100.00)

## 2019-04-15 LAB — TSH: TSH: 1.38 u[IU]/mL (ref 0.35–4.50)

## 2019-04-15 LAB — LIPID PANEL
Cholesterol: 146 mg/dL (ref 0–200)
HDL: 62.4 mg/dL (ref >39.00)
LDL Cholesterol: 68 mg/dL (ref 0–99)
NonHDL: 83.64
Total CHOL/HDL Ratio: 2
Triglycerides: 78 mg/dL (ref 0.0–149.0)
VLDL: 15.6 mg/dL (ref 0.0–40.0)

## 2019-04-17 DIAGNOSIS — I10 Essential (primary) hypertension: Secondary | ICD-10-CM | POA: Diagnosis not present

## 2019-04-17 DIAGNOSIS — R61 Generalized hyperhidrosis: Secondary | ICD-10-CM | POA: Diagnosis not present

## 2019-04-17 DIAGNOSIS — R0902 Hypoxemia: Secondary | ICD-10-CM | POA: Diagnosis not present

## 2019-04-21 ENCOUNTER — Other Ambulatory Visit: Payer: Self-pay

## 2019-04-21 ENCOUNTER — Encounter: Payer: Self-pay | Admitting: Family Medicine

## 2019-04-21 ENCOUNTER — Ambulatory Visit (INDEPENDENT_AMBULATORY_CARE_PROVIDER_SITE_OTHER): Payer: Medicare Other | Admitting: Family Medicine

## 2019-04-21 VITALS — BP 150/70 | HR 72 | Temp 97.5°F | Ht 59.75 in | Wt 135.3 lb

## 2019-04-21 DIAGNOSIS — E559 Vitamin D deficiency, unspecified: Secondary | ICD-10-CM

## 2019-04-21 DIAGNOSIS — E119 Type 2 diabetes mellitus without complications: Secondary | ICD-10-CM | POA: Diagnosis not present

## 2019-04-21 DIAGNOSIS — E1169 Type 2 diabetes mellitus with other specified complication: Secondary | ICD-10-CM | POA: Diagnosis not present

## 2019-04-21 DIAGNOSIS — Z0000 Encounter for general adult medical examination without abnormal findings: Secondary | ICD-10-CM | POA: Diagnosis not present

## 2019-04-21 DIAGNOSIS — I10 Essential (primary) hypertension: Secondary | ICD-10-CM | POA: Diagnosis not present

## 2019-04-21 DIAGNOSIS — D649 Anemia, unspecified: Secondary | ICD-10-CM

## 2019-04-21 DIAGNOSIS — M810 Age-related osteoporosis without current pathological fracture: Secondary | ICD-10-CM

## 2019-04-21 DIAGNOSIS — E785 Hyperlipidemia, unspecified: Secondary | ICD-10-CM

## 2019-04-21 MED ORDER — ROSUVASTATIN CALCIUM 20 MG PO TABS: ORAL_TABLET | ORAL | 3 refills | Status: DC

## 2019-04-21 MED ORDER — POTASSIUM CHLORIDE CRYS ER 20 MEQ PO TBCR: EXTENDED_RELEASE_TABLET | ORAL | 3 refills | Status: DC

## 2019-04-21 NOTE — Assessment & Plan Note (Signed)
dexa 8/19  Intol of oral bisphosphonates  Enc exercise  No falls/fx  D level is therapeutic Re check 1 y

## 2019-04-21 NOTE — Assessment & Plan Note (Signed)
bp is elevated but better on 2nd check BP: (!) 150/70    Plan to have her daughter (nursing school) check bp sporatically when she is relaxed and call with numbers in 2 wk  Will adj med if needed  She is compliant with 4 med regimen

## 2019-04-21 NOTE — Assessment & Plan Note (Signed)
From thalassemia  No changes HB in mid 10 range No symptoms

## 2019-04-21 NOTE — Assessment & Plan Note (Signed)
Vitamin D level is therapeutic with current supplementation Disc importance of this to bone and overall health Level in 40s

## 2019-04-21 NOTE — Assessment & Plan Note (Signed)
Reviewed health habits including diet and exercise and skin cancer prevention Reviewed appropriate screening tests for age  Also reviewed health mt list, fam hx and immunization status , as well as social and family history   See HPI Labs reviewed  Disc getting on wait list for shingrix if covered  No falls  Given materials to work on advance directive No cognitive concerns  Good hearing and vision  Will f/u on BP  Planning flu shot in the fall

## 2019-04-21 NOTE — Assessment & Plan Note (Signed)
Disc goals for lipids and reasons to control them Rev last labs with pt Rev low sat fat diet in detail Good control with crestor and diet

## 2019-04-21 NOTE — Assessment & Plan Note (Signed)
Stable Lab Results  Component Value Date   HGBA1C 6.3 04/15/2019   Well controlled Disc diet /exercise No change in tx On arb and statin  Nl foot exam  utd eye care F/u 6 mo

## 2019-04-21 NOTE — Progress Notes (Signed)
Subjective:    Patient ID: Kathy Cunningham, female    DOB: 1936-08-30, 83 y.o.   MRN: [id number]  HPI Here for amw and health mt exam with review of chronic medical problems   I have personally reviewed the Medicare Annual Wellness questionnaire and have noted 1. The patient's medical and social history 2. Their use of alcohol, tobacco or illicit drugs 3. Their current medications and supplements 4. The patient's functional ability including ADL's, fall risks, home safety risks and hearing or visual             impairment. 5. Diet and physical activities 6. Evidence for depression or mood disorders  The patients weight, height, BMI have been recorded in the chart and visual acuity is per eye clinic.  I have made referrals, counseling and provided education to the patient based review of the above and I have provided the pt with a written personalized care plan for preventive services. Reviewed and updated provider list, see scanned forms.  See scanned forms.  Routine anticipatory guidance given to patient.  See health maintenance. Colon cancer screening colonoscopy 09  Breast cancer screening  Mammogram 5/20  Self breast exam- no lumps  Flu vaccine 10/19  Tetanus vaccine Td 1/11 Pneumovax completed Zoster vaccine (has had shingles in the past)- may be interested in vaccine  Dexa 8/19 -osteopenia both FN Is intolerant of bisphosphenates/oral  Falls- none  Fractures-none  Supplements - takes her vitamin D and ca  D level is ok at 24 Exercise -lots of walking/gardening   Advance directive- does not have/given materials  Cognitive function addressed- see scanned forms- and if abnormal then additional documentation follows.  No concerns- memory is pretty good  Still does her own finances/etc   PMH and SH reviewed  Meds, vitals, and allergies reviewed.   ROS: See HPI.  Otherwise negative.    Weight : Wt Readings from Last 3 Encounters:  04/21/19 135 lb 5 oz (61.4 kg)   10/29/18 132 lb 9 oz (60.1 kg)  09/30/18 131 lb 8 oz (59.6 kg)  walking  Eats fair - does eat fruits and vegetables  Daughter cooks -too salty  26.65 kg/m   Ate way too much salt yesterday -daughter cooked    Pt brought ekg with her from outside the office  NSR with PVC and inferior T wave abn (inverted in V4-V6) This is no different from her baseline  They checked it at the firehouse (during a weak episode when she forgot her bp medicine) - vasovagal episode -did not take her to the hospital    Hearing/vision:  Hearing Screening   125Hz  250Hz  500Hz  1000Hz  2000Hz  3000Hz  4000Hz  6000Hz  8000Hz   Right ear:   40 40 40  40    Left ear:   40 40 40  40    Vision Screening Comments: Pt had eye exam in Nov 2019 with Dr. George Ina at [facility]  No hearing problems  Has eye exam 11/30  No problems seeing   bp is stable today  No cp or palpitations or headaches or edema  No side effects to medicines  BP Readings from Last 3 Encounters:  04/21/19 (!) 172/80  10/29/18 138/68  09/30/18 (!) 175/80     Takes benicar 40 mg- takes in am  Metoprolol 100 mg bid- twice daily  Amlodipine 5 mg qd- takes in the evening  hctz 25 mg qd - takes in am   She took all her am medicine today  Pulse Readings from Last 3 Encounters:  04/21/19 72  10/29/18 66  09/30/18 70    DM2 Lab Results  Component Value Date   HGBA1C 6.3 04/15/2019  stable/well controlled  On arb and statin    Hyperlipidemia Lab Results  Component Value Date   CHOL 146 04/15/2019   CHOL 160 10/28/2018   CHOL 137 04/09/2018   Lab Results  Component Value Date   HDL 62.40 04/15/2019   HDL 71.10 10/28/2018   HDL 57.70 04/09/2018   Lab Results  Component Value Date   LDLCALC 68 04/15/2019   LDLCALC 74 10/28/2018   LDLCALC 64 04/09/2018   Lab Results  Component Value Date   TRIG 78.0 04/15/2019   TRIG 79.0 10/28/2018   TRIG 77.0 04/09/2018   Lab Results  Component Value Date   CHOLHDL 2  04/15/2019   CHOLHDL 2 10/28/2018   CHOLHDL 2 04/09/2018   No results found for: LDLDIRECT  Controlled with crestor and diet   Chronic anemia from thalassemia Lab Results  Component Value Date   WBC 5.7 04/15/2019   HGB 10.3 (L) 04/15/2019   HCT 30.9 (L) 04/15/2019   MCV 94.8 04/15/2019   PLT 256.0 04/15/2019    Stable   Lab Results  Component Value Date   CREATININE 1.03 04/15/2019   BUN 19 04/15/2019   NA 136 04/15/2019   K 4.0 04/15/2019   CL 100 04/15/2019   CO2 30 04/15/2019   Lab Results  Component Value Date   TSH 1.38 04/15/2019    Lab Results  Component Value Date   ALT 8 04/15/2019   AST 15 04/15/2019   ALKPHOS 50 04/15/2019   BILITOT 0.5 04/15/2019     Review of Systems     Objective:   Physical Exam        Assessment & Plan:

## 2019-04-21 NOTE — Patient Instructions (Addendum)
Get a flu shot in the fall - sept or later   If you are interested in the new shingles vaccine (Shingrix) - call your local pharmacy to check on coverage and availability  If affordable, get on a wait list at your pharmacy to get the vaccine.  Please work on an advance directive-use the blue packet  Fill it out and it notarized   Have your daughter check your blood pressure (only when relaxed, and not right after exercise)  Write down the readings - keep a log  Our goal is a blood pressure under 140 on top/ 90 on the bottom   Call us in about 2 weeks and give [place] some numbers   Also watch your salt/sodium intake  Keep walking   Follow up in 6 months

## 2019-05-07 ENCOUNTER — Other Ambulatory Visit: Payer: Self-pay | Admitting: Family Medicine

## 2019-06-06 ENCOUNTER — Other Ambulatory Visit: Payer: Self-pay | Admitting: Family Medicine

## 2019-08-04 DIAGNOSIS — E119 Type 2 diabetes mellitus without complications: Secondary | ICD-10-CM | POA: Diagnosis not present

## 2019-08-04 DIAGNOSIS — H401132 Primary open-angle glaucoma, bilateral, moderate stage: Secondary | ICD-10-CM | POA: Diagnosis not present

## 2019-08-04 LAB — HM DIABETES EYE EXAM

## 2019-08-07 ENCOUNTER — Encounter: Payer: Self-pay | Admitting: Family Medicine

## 2019-08-26 ENCOUNTER — Other Ambulatory Visit: Payer: Self-pay | Admitting: Family Medicine

## 2019-08-28 ENCOUNTER — Other Ambulatory Visit: Payer: Self-pay | Admitting: Family Medicine

## 2019-09-26 ENCOUNTER — Other Ambulatory Visit: Payer: Self-pay

## 2019-09-26 ENCOUNTER — Encounter: Payer: Self-pay | Admitting: Family Medicine

## 2019-09-26 ENCOUNTER — Ambulatory Visit (INDEPENDENT_AMBULATORY_CARE_PROVIDER_SITE_OTHER): Payer: Medicare Other | Admitting: Family Medicine

## 2019-09-26 VITALS — BP 134/80 | HR 81 | Temp 96.9°F | Ht 59.75 in | Wt 139.4 lb

## 2019-09-26 DIAGNOSIS — E1169 Type 2 diabetes mellitus with other specified complication: Secondary | ICD-10-CM

## 2019-09-26 DIAGNOSIS — H9311 Tinnitus, right ear: Secondary | ICD-10-CM | POA: Insufficient documentation

## 2019-09-26 DIAGNOSIS — D568 Other thalassemias: Secondary | ICD-10-CM | POA: Diagnosis not present

## 2019-09-26 DIAGNOSIS — H938X1 Other specified disorders of right ear: Secondary | ICD-10-CM | POA: Insufficient documentation

## 2019-09-26 DIAGNOSIS — E785 Hyperlipidemia, unspecified: Secondary | ICD-10-CM

## 2019-09-26 NOTE — Patient Instructions (Signed)
You have a knot /swollen area behind your ear  Try the gauze -tuck it under the arm of your glasses   I placed a referral to see ENT doctor for that and the ringing Our office will call you to set this up   In the mean time if anything changes let me know

## 2019-09-26 NOTE — Progress Notes (Signed)
Subjective:    Patient ID: Kathy Cunningham, female    DOB: [birth year], 84 y.o.   MRN: [id number]  This visit occurred during the SARS-CoV-2 public health emergency.  Safety protocols were in place, including screening questions prior to the visit, additional usage of staff PPE, and extensive cleaning of exam room while observing appropriate contact time as indicated for disinfecting solutions.    HPI Pt presents for eval of knot behind R ear   Wt Readings from Last 3 Encounters:  09/26/19 139 lb 7 oz (63.2 kg)  04/21/19 135 lb 5 oz (61.4 kg)  10/29/18 132 lb 9 oz (60.1 kg)   27.46 kg/m   Several weeks ago she developed discomfort behind her R ear  Her glasses were causing pain over the knot Mask strap does not bother her  No other knots   Feels fine No fever or uri symptoms  She has tinnitus -and once in while it hurts  pnd at night sometimes    Lab Results  Component Value Date   WBC 5.7 04/15/2019   HGB 10.3 (L) 04/15/2019   HCT 30.9 (L) 04/15/2019   MCV 94.8 04/15/2019   PLT 256.0 04/15/2019   Has thalassemia   Patient Active Problem List   Diagnosis Date Noted  . Lump of ear, right 09/26/2019  . Tinnitus aurium, right 09/26/2019  . Medicare annual wellness visit, subsequent 04/21/2019  . Initial Medicare annual wellness visit 06/15/2015  . Routine general medical examination at a health care facility 06/15/2015  . Estrogen deficiency 06/15/2015  . Breast cancer screening 12/17/2013  . Other screening mammogram 04/24/2011  . Glaucoma 04/24/2011  . Vitamin D deficiency 04/24/2011  . Anxiety 10/20/2010  . HYPOKALEMIA 10/22/2009  . OTHER THALASSEMIA 06/10/2008  . Osteoporosis 05/01/2008  . Diabetes type 2, controlled ([place]) 07/17/2007  . Hyperlipidemia associated with type 2 diabetes mellitus ([place]) 07/17/2007  . Anemia 07/17/2007  . Essential hypertension 07/17/2007  . PREMATURE VENTRICULAR CONTRACTIONS, FREQUENT 07/17/2007  . UTI'S, RECURRENT 07/17/2007     Past Medical History:  Diagnosis Date  . Anemia   . Diabetes mellitus    type II  . Dizziness    or vertigo  . Glaucoma   . Hyperlipidemia   . Hypertension    Past Surgical History:  Procedure Laterality Date  . bladder papilloma  2006  . ROTATOR CUFF REPAIR  01/2002  . vaginal hysterectomy     Social History   Tobacco Use  . Smoking status: Never Smoker  . Smokeless tobacco: Never Used  Substance Use Topics  . Alcohol use: No    Alcohol/week: 0.0 standard drinks  . Drug use: No   Family History  Problem Relation Age of Onset  . Heart disease Mother        MI  . Heart attack Mother   . Hypertension Mother   . Heart disease Father        MI  . Heart attack Father   . Hypertension Father   . Cancer Brother        pancreatic    Allergies  Allergen Reactions  . Atorvastatin Other (See Comments)    REACTION: cramps  . Risedronate Sodium Nausea And Vomiting       . Sulfonamide Derivatives Rash   Current Outpatient Medications on File Prior to Visit  Medication Sig Dispense Refill  . amLODipine (NORVASC) 5 MG tablet Take 1 tablet (5 mg total) by mouth daily. 90 tablet 3  . bimatoprost (  LUMIGAN) 0.01 % SOLN Place 1 drop into both eyes at bedtime.    . brimonidine (ALPHAGAN P) 0.1 % SOLN Place 1 drop into both eyes 2 (two) times daily.     . calcium citrate (CALCITRATE - DOSED IN MG ELEMENTAL CALCIUM) 950 MG tablet Take 1 tablet by mouth daily.     . cholecalciferol (VITAMIN D) 1000 UNITS tablet Take 2,000 Units by mouth daily.     . fluticasone (FLONASE) 50 MCG/ACT nasal spray PLACE 2 SPRAYS INTO THE NOSE DAILY AS NEEDED. 48 g 3  . glucose blood (ONETOUCH VERIO) test strip CHECK BLOOD SUGAR ONCE DAILY FOR DM (DX. E11.69) 100 strip 1  . hydrochlorothiazide (HYDRODIURIL) 25 MG tablet TAKE 1 TABLET BY MOUTH  DAILY 90 tablet 3  . metFORMIN (GLUCOPHAGE) 500 MG tablet TAKE 1 TABLET BY MOUTH  TWICE DAILY WITH MEALS 180 tablet 0  . metoprolol tartrate (LOPRESSOR) 100  MG tablet TAKE 1 TABLET BY MOUTH  TWICE DAILY 180 tablet 0  . olmesartan (BENICAR) 40 MG tablet TAKE 1 TABLET BY MOUTH  DAILY 90 tablet 3  . potassium chloride SA (K-DUR) 20 MEQ tablet TAKE 1 TABLET (20 MEQ) BY MOUTH  DAILY 90 tablet 3  . rosuvastatin (CRESTOR) 20 MG tablet TAKE 1 TABLET (20 MG) BY MOUTH  DAILY AT NOON 90 tablet 3   No current facility-administered medications on file prior to visit.     Review of Systems  Constitutional: Negative for activity change, appetite change, fatigue, fever and unexpected weight change.  HENT: Positive for tinnitus. Negative for congestion, ear pain, rhinorrhea, sinus pressure and sore throat.   Eyes: Negative for pain, redness and visual disturbance.  Respiratory: Negative for cough, shortness of breath and wheezing.   Cardiovascular: Negative for chest pain and palpitations.  Gastrointestinal: Negative for abdominal pain, blood in stool, constipation and diarrhea.  Endocrine: Negative for polydipsia and polyuria.  Genitourinary: Negative for dysuria, frequency and urgency.  Musculoskeletal: Negative for arthralgias, back pain and myalgias.  Skin: Negative for pallor and rash.       Knot behind R ear- uncomfortable  Allergic/Immunologic: Negative for environmental allergies.  Neurological: Negative for dizziness, syncope and headaches.  Hematological: Negative for adenopathy. Does not bruise/bleed easily.  Psychiatric/Behavioral: Negative for decreased concentration and dysphoric mood. The patient is not nervous/anxious.        Objective:   Physical Exam Constitutional:      General: She is not in acute distress.    Appearance: Normal appearance. She is normal weight. She is not ill-appearing or diaphoretic.  HENT:     Head: Normocephalic and atraumatic.     Right Ear: Tympanic membrane, ear canal and external ear normal. There is no impacted cerumen.     Left Ear: Tympanic membrane, ear canal and external ear normal. There is no impacted  cerumen.     Ears:     Comments: Small 2-3 mm knot palpated in the fold between pinna and scalp (right ear)  No color change or redness and not mobile Feels like cartilage -possibly attached to pinna Uncomfortable for glasses arm to sit upon it    Nose: Nose normal.     Mouth/Throat:     Mouth: Mucous membranes are moist.     Pharynx: Oropharynx is clear.  Eyes:     General:        Right eye: No discharge.        Left eye: No discharge.     Extraocular Movements:  Extraocular movements intact.     Conjunctiva/sclera: Conjunctivae normal.     Pupils: Pupils are equal, round, and reactive to light.  Cardiovascular:     Rate and Rhythm: Normal rate and regular rhythm.  Pulmonary:     Effort: Pulmonary effort is normal. No respiratory distress.     Breath sounds: Normal breath sounds. No wheezing.  Musculoskeletal:     Cervical back: Normal range of motion and neck supple. No tenderness.  Lymphadenopathy:     Cervical: No cervical adenopathy.  Skin:    General: Skin is warm and dry.     Coloration: Skin is not pale.     Findings: No erythema or rash.  Neurological:     Mental Status: She is alert. Mental status is at baseline.     Cranial Nerves: No cranial nerve deficit.  Psychiatric:        Mood and Affect: Mood normal.           Assessment & Plan:   Problem List Items Addressed This Visit      Endocrine   Hyperlipidemia associated with type 2 diabetes mellitus ([place])    Taking crestor        Nervous and Auditory   Lump of ear, right - Primary    Small firm knot palpated in the fold between pinna and head posteriorly- this feels like cartilage  Not tender but is uncomfortable when her glasses sit on it  Used gauze to create a cushion Ref to ENT      Relevant Orders   Ambulatory referral to ENT     Other   Thalassemia    Stable in august No clinical changes      Tinnitus aurium, right    Per pt w/o significant hearing loss No pain  Nl appearing  TM Ref to ENT for eval along with knot behind ear      Relevant Orders   Ambulatory referral to ENT

## 2019-09-28 NOTE — Assessment & Plan Note (Signed)
Small firm knot palpated in the fold between pinna and head posteriorly- this feels like cartilage  Not tender but is uncomfortable when her glasses sit on it  Used gauze to create a cushion Ref to ENT

## 2019-09-28 NOTE — Assessment & Plan Note (Signed)
Stable in august No clinical changes

## 2019-09-28 NOTE — Assessment & Plan Note (Signed)
Per pt w/o significant hearing loss No pain  Nl appearing TM Ref to ENT for eval along with knot behind ear

## 2019-09-28 NOTE — Assessment & Plan Note (Signed)
Taking crestor

## 2019-10-10 DIAGNOSIS — H9313 Tinnitus, bilateral: Secondary | ICD-10-CM | POA: Diagnosis not present

## 2019-10-10 DIAGNOSIS — R590 Localized enlarged lymph nodes: Secondary | ICD-10-CM | POA: Diagnosis not present

## 2019-10-10 DIAGNOSIS — H9113 Presbycusis, bilateral: Secondary | ICD-10-CM | POA: Diagnosis not present

## 2019-10-10 DIAGNOSIS — H903 Sensorineural hearing loss, bilateral: Secondary | ICD-10-CM | POA: Diagnosis not present

## 2019-10-10 DIAGNOSIS — J310 Chronic rhinitis: Secondary | ICD-10-CM | POA: Diagnosis not present

## 2019-10-11 ENCOUNTER — Other Ambulatory Visit: Payer: Self-pay | Admitting: Family Medicine

## 2019-10-14 ENCOUNTER — Telehealth: Payer: Self-pay | Admitting: Family Medicine

## 2019-10-14 DIAGNOSIS — I10 Essential (primary) hypertension: Secondary | ICD-10-CM

## 2019-10-14 DIAGNOSIS — E119 Type 2 diabetes mellitus without complications: Secondary | ICD-10-CM

## 2019-10-14 DIAGNOSIS — E1169 Type 2 diabetes mellitus with other specified complication: Secondary | ICD-10-CM

## 2019-10-14 NOTE — Telephone Encounter (Signed)
-----   Message from Ellamae Sia sent at 10/03/2019 12:15 PM EST ----- Regarding: Lab orders for Friday, 2.11.21 Lab orders

## 2019-10-17 ENCOUNTER — Other Ambulatory Visit: Payer: Self-pay

## 2019-10-17 ENCOUNTER — Other Ambulatory Visit (INDEPENDENT_AMBULATORY_CARE_PROVIDER_SITE_OTHER): Payer: Medicare Other

## 2019-10-17 DIAGNOSIS — E785 Hyperlipidemia, unspecified: Secondary | ICD-10-CM | POA: Diagnosis not present

## 2019-10-17 DIAGNOSIS — I10 Essential (primary) hypertension: Secondary | ICD-10-CM

## 2019-10-17 DIAGNOSIS — E119 Type 2 diabetes mellitus without complications: Secondary | ICD-10-CM

## 2019-10-17 DIAGNOSIS — E1169 Type 2 diabetes mellitus with other specified complication: Secondary | ICD-10-CM | POA: Diagnosis not present

## 2019-10-17 LAB — COMPREHENSIVE METABOLIC PANEL
ALT: 9 U/L (ref 0–35)
AST: 18 U/L (ref 0–37)
Albumin: 4.2 g/dL (ref 3.5–5.2)
Alkaline Phosphatase: 50 U/L (ref 39–117)
BUN: 16 mg/dL (ref 6–23)
CO2: 33 mEq/L — ABNORMAL HIGH (ref 19–32)
Calcium: 9.6 mg/dL (ref 8.4–10.5)
Chloride: 100 mEq/L (ref 96–112)
Creatinine, Ser: 1 mg/dL (ref 0.40–1.20)
GFR: 64.24 mL/min (ref >60.00)
Glucose, Bld: 126 mg/dL — ABNORMAL HIGH (ref 70–99)
Potassium: 4 mEq/L (ref 3.5–5.1)
Sodium: 139 mEq/L (ref 135–145)
Total Bilirubin: 0.6 mg/dL (ref 0.2–1.2)
Total Protein: 6.8 g/dL (ref 6.0–8.3)

## 2019-10-17 LAB — LIPID PANEL
Cholesterol: 172 mg/dL (ref 0–200)
HDL: 75.3 mg/dL (ref >39.00)
LDL Cholesterol: 82 mg/dL (ref 0–99)
NonHDL: 96.4
Total CHOL/HDL Ratio: 2
Triglycerides: 73 mg/dL (ref 0.0–149.0)
VLDL: 14.6 mg/dL (ref 0.0–40.0)

## 2019-10-17 LAB — HEMOGLOBIN A1C: Hgb A1c MFr Bld: 6.4 % (ref 4.6–6.5)

## 2019-10-22 ENCOUNTER — Ambulatory Visit (INDEPENDENT_AMBULATORY_CARE_PROVIDER_SITE_OTHER): Payer: Medicare Other | Admitting: Family Medicine

## 2019-10-22 ENCOUNTER — Encounter: Payer: Self-pay | Admitting: Family Medicine

## 2019-10-22 ENCOUNTER — Other Ambulatory Visit: Payer: Self-pay

## 2019-10-22 VITALS — BP 150/70 | HR 80 | Temp 96.9°F | Ht 59.75 in | Wt 139.2 lb

## 2019-10-22 DIAGNOSIS — E119 Type 2 diabetes mellitus without complications: Secondary | ICD-10-CM | POA: Diagnosis not present

## 2019-10-22 DIAGNOSIS — H938X1 Other specified disorders of right ear: Secondary | ICD-10-CM

## 2019-10-22 DIAGNOSIS — H903 Sensorineural hearing loss, bilateral: Secondary | ICD-10-CM | POA: Diagnosis not present

## 2019-10-22 DIAGNOSIS — E1169 Type 2 diabetes mellitus with other specified complication: Secondary | ICD-10-CM

## 2019-10-22 DIAGNOSIS — I10 Essential (primary) hypertension: Secondary | ICD-10-CM

## 2019-10-22 DIAGNOSIS — H9190 Unspecified hearing loss, unspecified ear: Secondary | ICD-10-CM | POA: Insufficient documentation

## 2019-10-22 DIAGNOSIS — E785 Hyperlipidemia, unspecified: Secondary | ICD-10-CM

## 2019-10-22 MED ORDER — AMLODIPINE BESYLATE 10 MG PO TABS: 10.0000 mg | ORAL_TABLET | Freq: Every day | ORAL | 3 refills | Status: DC

## 2019-10-22 NOTE — Assessment & Plan Note (Signed)
Has seen ENT = suspect LN No improvement so far with augmentin  Has f/u there on 2/26 -suspect she will have a bx

## 2019-10-22 NOTE — Assessment & Plan Note (Signed)
Lab Results  Component Value Date   HGBA1C 6.4 10/17/2019   Stable On arb and statin  No complications Eye exam utd and good foot care  F/u 6 mo

## 2019-10-22 NOTE — Assessment & Plan Note (Signed)
Disc goals for lipids and reasons to control them Rev last labs with pt Rev low sat fat diet in detail Overall LDL is up a little- from fried foods  She will cut back  Disc goal of LDL under 70 She has vasc dz in family  Continue crestor

## 2019-10-22 NOTE — Progress Notes (Signed)
Subjective:    Patient ID: Kathy Cunningham, female    DOB: 01/16/1936, 84 y.o.   MRN: [id number]  This visit occurred during the SARS-CoV-2 public health emergency.  Safety protocols were in place, including screening questions prior to the visit, additional usage of staff PPE, and extensive cleaning of exam room while observing appropriate contact time as indicated for disinfecting solutions.    HPI Pt presents for f/u of chronic health problems   Wt Readings from Last 3 Encounters:  10/22/19 139 lb 4 oz (63.2 kg)  09/26/19 139 lb 7 oz (63.2 kg)  04/21/19 135 lb 5 oz (61.4 kg)   27.42 kg/m   A little wobbly  Needs to exercise  Has some kettle bells A little yoga   She saw ENT for lump behind R ear-thought to be a LN  tx with augmentin It is still there-not painful  Has f/u on 2/26  Also had hearing eval - will discuss hearing aides     Hypertension  bp is up again here today  No cp or palpitations or headaches or edema  No side effects to medicines  BP Readings from Last 3 Encounters:  10/22/19 (!) 160/64  09/26/19 134/80  04/21/19 (!) 150/70    benicar 40 mg  Metoprolol 100 mg bid  (missed it last night but took it this am)  hctz 25 mg daily  Amlodipine 5 mg daily   Home bp readings -wrist cuff - high also  Only used once so far    Pulse Readings from Last 3 Encounters:  10/22/19 80  09/26/19 81  04/21/19 72     Lab Results  Component Value Date   CREATININE 1.00 10/17/2019   BUN 16 10/17/2019   NA 139 10/17/2019   K 4.0 10/17/2019   CL 100 10/17/2019   CO2 33 (H) 10/17/2019   Lab Results  Component Value Date   ALT 9 10/17/2019   AST 18 10/17/2019   ALKPHOS 50 10/17/2019   BILITOT 0.6 10/17/2019     DM2 Lab Results  Component Value Date   HGBA1C 6.4 10/17/2019  last 6.3 in august Well controlled  On arb and statin  Metformin -no problems No high or low glucose readings  Eye exam 11/20   She does not eat a lot in general  She  eats salads and vegetables  Dislikes meat  Eats nuts for her protein    Hyperlipidemia Lab Results  Component Value Date   CHOL 172 10/17/2019   CHOL 146 04/15/2019   CHOL 160 10/28/2018   Lab Results  Component Value Date   HDL 75.30 10/17/2019   HDL 62.40 04/15/2019   HDL 71.10 10/28/2018   Lab Results  Component Value Date   LDLCALC 82 10/17/2019   LDLCALC 68 04/15/2019   LDLCALC 74 10/28/2018   Lab Results  Component Value Date   TRIG 73.0 10/17/2019   TRIG 78.0 04/15/2019   TRIG 79.0 10/28/2018   Lab Results  Component Value Date   CHOLHDL 2 10/17/2019   CHOLHDL 2 04/15/2019   CHOLHDL 2 10/28/2018   No results found for: LDLDIRECT crestor and diet  LDL 82  She has eaten more fried foods lately   Patient Active Problem List   Diagnosis Date Noted  . Hearing loss 10/22/2019  . Lump of ear, right 09/26/2019  . Tinnitus aurium, right 09/26/2019  . Medicare annual wellness visit, subsequent 04/21/2019  . Initial Medicare annual wellness visit 06/15/2015  .  Routine general medical examination at a health care facility 06/15/2015  . Estrogen deficiency 06/15/2015  . Breast cancer screening 12/17/2013  . Other screening mammogram 04/24/2011  . Glaucoma 04/24/2011  . Vitamin D deficiency 04/24/2011  . Anxiety 10/20/2010  . HYPOKALEMIA 10/22/2009  . Thalassemia 06/10/2008  . Osteoporosis 05/01/2008  . Diabetes type 2, controlled ([place]) 07/17/2007  . Hyperlipidemia associated with type 2 diabetes mellitus ([place]) 07/17/2007  . Anemia 07/17/2007  . Essential hypertension 07/17/2007  . PREMATURE VENTRICULAR CONTRACTIONS, FREQUENT 07/17/2007  . UTI'S, RECURRENT 07/17/2007   Past Medical History:  Diagnosis Date  . Anemia   . Diabetes mellitus    type II  . Dizziness    or vertigo  . Glaucoma   . Hyperlipidemia   . Hypertension    Past Surgical History:  Procedure Laterality Date  . bladder papilloma  2006  . ROTATOR CUFF REPAIR  01/2002  . vaginal  hysterectomy     Social History   Tobacco Use  . Smoking status: Never Smoker  . Smokeless tobacco: Never Used  Substance Use Topics  . Alcohol use: No    Alcohol/week: 0.0 standard drinks  . Drug use: No   Family History  Problem Relation Age of Onset  . Heart disease Mother        MI  . Heart attack Mother   . Hypertension Mother   . Heart disease Father        MI  . Heart attack Father   . Hypertension Father   . Cancer Brother        pancreatic    Allergies  Allergen Reactions  . Atorvastatin Other (See Comments)    REACTION: cramps  . Risedronate Sodium Nausea And Vomiting       . Sulfonamide Derivatives Rash   Current Outpatient Medications on File Prior to Visit  Medication Sig Dispense Refill  . amoxicillin-clavulanate (AUGMENTIN) 875-125 MG tablet Take 1 tablet by mouth 2 (two) times daily.    . bimatoprost (LUMIGAN) 0.01 % SOLN Place 1 drop into both eyes at bedtime.    . brimonidine (ALPHAGAN P) 0.1 % SOLN Place 1 drop into both eyes 2 (two) times daily.     . calcium citrate (CALCITRATE - DOSED IN MG ELEMENTAL CALCIUM) 950 MG tablet Take 1 tablet by mouth daily.     . cholecalciferol (VITAMIN D) 1000 UNITS tablet Take 2,000 Units by mouth daily.     . fluticasone (FLONASE) 50 MCG/ACT nasal spray PLACE 2 SPRAYS INTO THE NOSE DAILY AS NEEDED. 48 g 3  . hydrochlorothiazide (HYDRODIURIL) 25 MG tablet TAKE 1 TABLET BY MOUTH  DAILY 90 tablet 3  . metFORMIN (GLUCOPHAGE) 500 MG tablet TAKE 1 TABLET BY MOUTH  TWICE DAILY WITH MEALS 180 tablet 0  . metoprolol tartrate (LOPRESSOR) 100 MG tablet TAKE 1 TABLET BY MOUTH  TWICE DAILY 180 tablet 0  . olmesartan (BENICAR) 40 MG tablet TAKE 1 TABLET BY MOUTH  DAILY 90 tablet 3  . ONETOUCH VERIO test strip CHECK BLOOD SUGAR ONCE DAILY FOR DM (DX. E11.69) 100 strip 1  . potassium chloride SA (K-DUR) 20 MEQ tablet TAKE 1 TABLET (20 MEQ) BY MOUTH  DAILY 90 tablet 3  . rosuvastatin (CRESTOR) 20 MG tablet TAKE 1 TABLET (20 MG) BY  MOUTH  DAILY AT NOON 90 tablet 3   No current facility-administered medications on file prior to visit.    Review of Systems  Constitutional: Negative for activity change, appetite change, fatigue,  fever and unexpected weight change.  HENT: Positive for hearing loss. Negative for congestion, ear pain, rhinorrhea, sinus pressure and sore throat.        Knot behind R ear is still bothersome   Eyes: Negative for pain, redness and visual disturbance.  Respiratory: Negative for cough, shortness of breath and wheezing.   Cardiovascular: Negative for chest pain and palpitations.  Gastrointestinal: Negative for abdominal pain, blood in stool, constipation and diarrhea.  Endocrine: Negative for polydipsia and polyuria.  Genitourinary: Negative for dysuria, frequency and urgency.  Musculoskeletal: Negative for arthralgias, back pain and myalgias.  Skin: Negative for pallor and rash.  Allergic/Immunologic: Negative for environmental allergies.  Neurological: Negative for dizziness, syncope and headaches.       Poor balance with age  Hematological: Negative for adenopathy. Does not bruise/bleed easily.  Psychiatric/Behavioral: Negative for decreased concentration and dysphoric mood. The patient is not nervous/anxious.        Objective:   Physical Exam Constitutional:      General: She is not in acute distress.    Appearance: Normal appearance. She is well-developed. She is not ill-appearing or diaphoretic.  HENT:     Head: Normocephalic and atraumatic.     Ears:     Comments: Hard of hearing  Small lump still present behind R ear     Mouth/Throat:     Mouth: Mucous membranes are moist.  Eyes:     General: No scleral icterus.    Conjunctiva/sclera: Conjunctivae normal.     Pupils: Pupils are equal, round, and reactive to light.  Neck:     Thyroid: No thyromegaly.     Vascular: No carotid bruit or JVD.  Cardiovascular:     Rate and Rhythm: Normal rate and regular rhythm.     Pulses:  Normal pulses.     Heart sounds: Normal heart sounds. No gallop.   Pulmonary:     Effort: Pulmonary effort is normal. No respiratory distress.     Breath sounds: Normal breath sounds. No wheezing or rales.  Abdominal:     General: Bowel sounds are normal. There is no distension or abdominal bruit.     Palpations: Abdomen is soft. There is no mass.     Tenderness: There is no abdominal tenderness.  Musculoskeletal:     Cervical back: Normal range of motion and neck supple. No tenderness.     Right lower leg: No edema.     Left lower leg: No edema.  Lymphadenopathy:     Cervical: No cervical adenopathy.  Skin:    General: Skin is warm and dry.     Coloration: Skin is not pale.     Findings: No erythema or rash.  Neurological:     Mental Status: She is alert.     Sensory: No sensory deficit.     Coordination: Coordination normal.     Deep Tendon Reflexes: Reflexes are normal and symmetric. Reflexes normal.  Psychiatric:        Mood and Affect: Mood normal.        Cognition and Memory: Cognition and memory normal.           Assessment & Plan:   Problem List Items Addressed This Visit      Cardiovascular and Mediastinum   Essential hypertension - Primary    bp is still elevated  BP: (!) 150/70   plan to inc amlodipine from 5 to 10 mg daily  inst to call if side effects (like edema)  Disc  low sodium diet and exercise (yoga) Disc checking bp at home (wrist cuff)-inst her re: proper way to do this when relaxed F/u 1 mo with her wrist cuff to test it       Relevant Medications   amLODipine (NORVASC) 10 MG tablet     Endocrine   Diabetes type 2, controlled ([place])    Lab Results  Component Value Date   HGBA1C 6.4 10/17/2019   Stable On arb and statin  No complications Eye exam utd and good foot care  F/u 6 mo       Hyperlipidemia associated with type 2 diabetes mellitus ([place])    Disc goals for lipids and reasons to control them Rev last labs with pt Rev low  sat fat diet in detail Overall LDL is up a little- from fried foods  She will cut back  Disc goal of LDL under 70 She has vasc dz in family  Continue crestor         Nervous and Auditory   Lump of ear, right    Has seen ENT = suspect LN No improvement so far with augmentin  Has f/u there on 2/26 -suspect she will have a bx      Hearing loss    Pt was tested/audiology eval Plans to discuss hearing aides at f/u on 2/26

## 2019-10-22 NOTE — Assessment & Plan Note (Signed)
Pt was tested/audiology eval Plans to discuss hearing aides at f/u on 2/26

## 2019-10-22 NOTE — Patient Instructions (Addendum)
When you check your blood pressure at home- make sure you are relaxed (and wait at least 30 minutes after exercise) Cross the arm over your chest so the wrist cuff is at heart level   Go up on your amlodipine to 10 mg once daily  You can double up on the 5 mg you have (take 2 pills once daily) then the new px for 10 mg pills will just be one daily   Follow up in about 1 month for blood pressure  Bring your blood pressure cuff with you next time so we can test it   Make sure you gets protein with every meal  Nuts, beans (not green beans), eggs, dairy products, soy or tofu, nut butters (like peanut butter), and fish   Do yoga ! For balance and strength

## 2019-10-22 NOTE — Assessment & Plan Note (Signed)
bp is still elevated  BP: (!) 150/70   plan to inc amlodipine from 5 to 10 mg daily  inst to call if side effects (like edema)  Disc low sodium diet and exercise (yoga) Disc checking bp at home (wrist cuff)-inst her re: proper way to do this when relaxed F/u 1 mo with her wrist cuff to test it

## 2019-11-05 DIAGNOSIS — R590 Localized enlarged lymph nodes: Secondary | ICD-10-CM | POA: Diagnosis not present

## 2019-11-10 ENCOUNTER — Other Ambulatory Visit: Payer: Self-pay | Admitting: Family Medicine

## 2019-11-17 ENCOUNTER — Other Ambulatory Visit: Payer: Self-pay | Admitting: Family Medicine

## 2019-11-21 ENCOUNTER — Other Ambulatory Visit: Payer: Self-pay

## 2019-11-21 ENCOUNTER — Ambulatory Visit (INDEPENDENT_AMBULATORY_CARE_PROVIDER_SITE_OTHER): Payer: Medicare Other | Admitting: Family Medicine

## 2019-11-21 ENCOUNTER — Encounter: Payer: Self-pay | Admitting: Family Medicine

## 2019-11-21 VITALS — BP 130/60 | HR 66 | Temp 96.4°F | Ht 59.75 in | Wt 139.1 lb

## 2019-11-21 DIAGNOSIS — I10 Essential (primary) hypertension: Secondary | ICD-10-CM | POA: Diagnosis not present

## 2019-11-21 NOTE — Progress Notes (Signed)
Subjective:    Patient ID: Kathy Cunningham, female    DOB: 1936/08/11, 84 y.o.   MRN: [id number]  This visit occurred during the SARS-CoV-2 public health emergency.  Safety protocols were in place, including screening questions prior to the visit, additional usage of staff PPE, and extensive cleaning of exam room while observing appropriate contact time as indicated for disinfecting solutions.    HPI Here for f/u of HTN  Wt Readings from Last 3 Encounters:  11/21/19 139 lb 1 oz (63.1 kg)  10/22/19 139 lb 4 oz (63.2 kg)  09/26/19 139 lb 7 oz (63.2 kg)   27.39 kg/m   bp is stable today  No cp or palpitations or headaches or edema  No side effects to medicines  BP Readings from Last 3 Encounters:  11/21/19 (!) 144/62  10/22/19 (!) 150/70  09/26/19 134/80    Re check BP: 130/60   130/82 with her wrist cuff  Had her covid vaccines    Increased amlodipine from 5 to 10 mg daily last visit  Disc low sodium diet and lifestyle change Also takes hctz and metoprolol and olmesartan   Has not checked at home often  139/60 one day   Has a little bit of leg swelling from the amlodipine  Tolerates this   Patient Active Problem List   Diagnosis Date Noted  . Hearing loss 10/22/2019  . Lump of ear, right 09/26/2019  . Tinnitus aurium, right 09/26/2019  . Medicare annual wellness visit, subsequent 04/21/2019  . Initial Medicare annual wellness visit 06/15/2015  . Routine general medical examination at a health care facility 06/15/2015  . Estrogen deficiency 06/15/2015  . Breast cancer screening 12/17/2013  . Other screening mammogram 04/24/2011  . Glaucoma 04/24/2011  . Vitamin D deficiency 04/24/2011  . Anxiety 10/20/2010  . HYPOKALEMIA 10/22/2009  . Thalassemia 06/10/2008  . Osteoporosis 05/01/2008  . Diabetes type 2, controlled ([place]) 07/17/2007  . Hyperlipidemia associated with type 2 diabetes mellitus ([place]) 07/17/2007  . Anemia 07/17/2007  . Essential hypertension  07/17/2007  . PREMATURE VENTRICULAR CONTRACTIONS, FREQUENT 07/17/2007  . UTI'S, RECURRENT 07/17/2007   Past Medical History:  Diagnosis Date  . Anemia   . Diabetes mellitus    type II  . Dizziness    or vertigo  . Glaucoma   . Hyperlipidemia   . Hypertension    Past Surgical History:  Procedure Laterality Date  . bladder papilloma  2006  . ROTATOR CUFF REPAIR  01/2002  . vaginal hysterectomy     Social History   Tobacco Use  . Smoking status: Never Smoker  . Smokeless tobacco: Never Used  Substance Use Topics  . Alcohol use: No    Alcohol/week: 0.0 standard drinks  . Drug use: No   Family History  Problem Relation Age of Onset  . Heart disease Mother        MI  . Heart attack Mother   . Hypertension Mother   . Heart disease Father        MI  . Heart attack Father   . Hypertension Father   . Cancer Brother        pancreatic    Allergies  Allergen Reactions  . Atorvastatin Other (See Comments)    REACTION: cramps  . Risedronate Sodium Nausea And Vomiting       . Sulfonamide Derivatives Rash   Current Outpatient Medications on File Prior to Visit  Medication Sig Dispense Refill  . amLODipine (NORVASC) 10 MG  tablet Take 1 tablet (10 mg total) by mouth daily. 90 tablet 3  . bimatoprost (LUMIGAN) 0.01 % SOLN Place 1 drop into both eyes at bedtime.    . brimonidine (ALPHAGAN P) 0.1 % SOLN Place 1 drop into both eyes 2 (two) times daily.     . calcium citrate (CALCITRATE - DOSED IN MG ELEMENTAL CALCIUM) 950 MG tablet Take 1 tablet by mouth daily.     . cholecalciferol (VITAMIN D) 1000 UNITS tablet Take 2,000 Units by mouth daily.     Marland Kitchen doxycycline (VIBRAMYCIN) 100 MG capsule Take 100 mg by mouth 2 (two) times daily.    . fluticasone (FLONASE) 50 MCG/ACT nasal spray PLACE 2 SPRAYS INTO THE NOSE DAILY AS NEEDED. 48 g 3  . hydrochlorothiazide (HYDRODIURIL) 25 MG tablet TAKE 1 TABLET BY MOUTH  DAILY 90 tablet 3  . metFORMIN (GLUCOPHAGE) 500 MG tablet TAKE 1 TABLET  BY MOUTH  TWICE DAILY WITH MEALS 180 tablet 1  . metoprolol tartrate (LOPRESSOR) 100 MG tablet TAKE 1 TABLET BY MOUTH  TWICE DAILY 180 tablet 1  . olmesartan (BENICAR) 40 MG tablet TAKE 1 TABLET BY MOUTH  DAILY 90 tablet 3  . ONETOUCH VERIO test strip CHECK BLOOD SUGAR ONCE DAILY FOR DM (DX. E11.69) 100 strip 1  . potassium chloride SA (K-DUR) 20 MEQ tablet TAKE 1 TABLET (20 MEQ) BY MOUTH  DAILY 90 tablet 3  . rosuvastatin (CRESTOR) 20 MG tablet TAKE 1 TABLET (20 MG) BY MOUTH  DAILY AT NOON 90 tablet 3   No current facility-administered medications on file prior to visit.    Review of Systems  Constitutional: Negative for activity change, appetite change, fatigue, fever and unexpected weight change.  HENT: Negative for congestion, ear pain, rhinorrhea, sinus pressure and sore throat.   Eyes: Negative for pain, redness and visual disturbance.  Respiratory: Negative for cough, shortness of breath and wheezing.   Cardiovascular: Positive for leg swelling. Negative for chest pain and palpitations.  Gastrointestinal: Negative for abdominal pain, blood in stool, constipation and diarrhea.  Endocrine: Negative for polydipsia and polyuria.  Genitourinary: Negative for dysuria, frequency and urgency.  Musculoskeletal: Negative for arthralgias, back pain and myalgias.  Skin: Negative for pallor and rash.  Allergic/Immunologic: Negative for environmental allergies.  Neurological: Negative for dizziness, syncope and headaches.  Hematological: Negative for adenopathy. Does not bruise/bleed easily.  Psychiatric/Behavioral: Negative for decreased concentration and dysphoric mood. The patient is not nervous/anxious.        Objective:   Physical Exam Constitutional:      General: She is not in acute distress.    Appearance: Normal appearance. She is well-developed and normal weight. She is not ill-appearing.  HENT:     Head: Normocephalic and atraumatic.  Eyes:     Conjunctiva/sclera: Conjunctivae  normal.     Pupils: Pupils are equal, round, and reactive to light.  Neck:     Thyroid: No thyromegaly.     Vascular: No carotid bruit or JVD.  Cardiovascular:     Rate and Rhythm: Normal rate and regular rhythm.     Heart sounds: Normal heart sounds. No gallop.   Pulmonary:     Effort: Pulmonary effort is normal. No respiratory distress.     Breath sounds: Normal breath sounds. No wheezing or rales.  Abdominal:     General: There is no abdominal bruit.  Musculoskeletal:     Cervical back: Normal range of motion and neck supple.     Right lower leg:  Edema present.     Left lower leg: Edema present.     Comments: Trace pedal edema bilaterally  Lymphadenopathy:     Cervical: No cervical adenopathy.  Skin:    General: Skin is warm and dry.     Findings: No rash.  Neurological:     Mental Status: She is alert.     Coordination: Coordination normal.     Deep Tendon Reflexes: Reflexes are normal and symmetric. Reflexes normal.  Psychiatric:        Mood and Affect: Mood normal.           Assessment & Plan:   Problem List Items Addressed This Visit      Cardiovascular and Mediastinum   Essential hypertension - Primary    bp in fair control at this time  BP Readings from Last 1 Encounters:  11/21/19 130/60   No changes needed Most recent labs reviewed  Disc lifstyle change with low sodium diet and exercise  Increased amlodipine dose to 10 mg has this at goal Moe pedal edema-she is tolerating   Continues hctz and metoprolol and losartan

## 2019-11-21 NOTE — Patient Instructions (Signed)
It you can tolerate the leg swelling (watch salt intake) -then stay on the increased amlodipine dose and other medicines   Your blood pressure is controlled  Take care of yourself

## 2019-11-23 NOTE — Assessment & Plan Note (Signed)
bp in fair control at this time  BP Readings from Last 1 Encounters:  11/21/19 130/60   No changes needed Most recent labs reviewed  Disc lifstyle change with low sodium diet and exercise  Increased amlodipine dose to 10 mg has this at goal Moe pedal edema-she is tolerating   Continues hctz and metoprolol and losartan

## 2020-02-05 ENCOUNTER — Other Ambulatory Visit: Payer: Self-pay | Admitting: Family Medicine

## 2020-02-09 ENCOUNTER — Telehealth: Payer: Self-pay | Admitting: *Deleted

## 2020-02-09 MED ORDER — ALPRAZOLAM 0.5 MG PO TABS: ORAL_TABLET | ORAL | 0 refills | Status: DC

## 2020-02-09 NOTE — Telephone Encounter (Signed)
Pt notified Rx sent. Pt said she isn't driving. Fall/sedation risk given and pt verbalized understanding

## 2020-02-09 NOTE — Telephone Encounter (Signed)
I sent in xanax Use carefully (no not drive with it) It can sedate and increase fall risk I hope the trip goes well

## 2020-02-09 NOTE — Telephone Encounter (Signed)
Pt left VM at triage. Pt said she has a long car trip later this month and wants a Rx sent in to help with her "nerves" pt is requesting at least 6 pills or so to get her through this trip. Pt said she needs it for "anxiety and anguish" while she travels and she doesn't want to come in for an appt she would like Dr. Glori Bickers just send it in and no make her come in for an appt

## 2020-02-11 DIAGNOSIS — H401132 Primary open-angle glaucoma, bilateral, moderate stage: Secondary | ICD-10-CM | POA: Diagnosis not present

## 2020-03-22 ENCOUNTER — Other Ambulatory Visit: Payer: Self-pay | Admitting: Family Medicine

## 2020-04-02 ENCOUNTER — Other Ambulatory Visit: Payer: Self-pay | Admitting: Family Medicine

## 2020-04-02 ENCOUNTER — Ambulatory Visit: Payer: Medicare Other | Admitting: Family Medicine

## 2020-04-02 DIAGNOSIS — Z1231 Encounter for screening mammogram for malignant neoplasm of breast: Secondary | ICD-10-CM

## 2020-04-06 ENCOUNTER — Other Ambulatory Visit: Payer: Self-pay | Admitting: Family Medicine

## 2020-04-14 ENCOUNTER — Other Ambulatory Visit: Payer: Self-pay

## 2020-04-14 ENCOUNTER — Ambulatory Visit
Admission: RE | Admit: 2020-04-14 | Discharge: 2020-04-14 | Disposition: A | Discharge status: Home / Self Care | Payer: Medicare Other | Source: Ambulatory Visit | Attending: Family Medicine | Admitting: Family Medicine

## 2020-04-14 DIAGNOSIS — Z1231 Encounter for screening mammogram for malignant neoplasm of breast: Secondary | ICD-10-CM

## 2020-04-20 DIAGNOSIS — R1313 Dysphagia, pharyngeal phase: Secondary | ICD-10-CM | POA: Diagnosis not present

## 2020-04-20 DIAGNOSIS — R590 Localized enlarged lymph nodes: Secondary | ICD-10-CM | POA: Diagnosis not present

## 2020-04-29 ENCOUNTER — Other Ambulatory Visit: Payer: Self-pay | Admitting: Family Medicine

## 2020-06-05 DIAGNOSIS — I959 Hypotension, unspecified: Secondary | ICD-10-CM | POA: Diagnosis not present

## 2020-06-05 DIAGNOSIS — R42 Dizziness and giddiness: Secondary | ICD-10-CM | POA: Diagnosis not present

## 2020-06-06 ENCOUNTER — Encounter (HOSPITAL_COMMUNITY): Payer: Self-pay | Admitting: Emergency Medicine

## 2020-06-06 ENCOUNTER — Emergency Department (HOSPITAL_COMMUNITY)
Admission: EM | Admit: 2020-06-06 | Discharge: 2020-06-06 | Disposition: A | Discharge status: Home / Self Care | Payer: Medicare Other | Attending: Emergency Medicine | Admitting: Emergency Medicine

## 2020-06-06 DIAGNOSIS — R55 Syncope and collapse: Secondary | ICD-10-CM | POA: Diagnosis not present

## 2020-06-06 DIAGNOSIS — E11649 Type 2 diabetes mellitus with hypoglycemia without coma: Secondary | ICD-10-CM | POA: Diagnosis not present

## 2020-06-06 DIAGNOSIS — Z79899 Other long term (current) drug therapy: Secondary | ICD-10-CM | POA: Diagnosis not present

## 2020-06-06 DIAGNOSIS — Z7984 Long term (current) use of oral hypoglycemic drugs: Secondary | ICD-10-CM | POA: Insufficient documentation

## 2020-06-06 DIAGNOSIS — E162 Hypoglycemia, unspecified: Secondary | ICD-10-CM

## 2020-06-06 DIAGNOSIS — R6889 Other general symptoms and signs: Secondary | ICD-10-CM | POA: Diagnosis not present

## 2020-06-06 DIAGNOSIS — R0902 Hypoxemia: Secondary | ICD-10-CM | POA: Diagnosis not present

## 2020-06-06 DIAGNOSIS — I10 Essential (primary) hypertension: Secondary | ICD-10-CM | POA: Diagnosis not present

## 2020-06-06 DIAGNOSIS — Z743 Need for continuous supervision: Secondary | ICD-10-CM | POA: Diagnosis not present

## 2020-06-06 DIAGNOSIS — R531 Weakness: Secondary | ICD-10-CM

## 2020-06-06 DIAGNOSIS — R404 Transient alteration of awareness: Secondary | ICD-10-CM | POA: Diagnosis not present

## 2020-06-06 LAB — CBC WITH DIFFERENTIAL/PLATELET
Abs Immature Granulocytes: 0 10*3/uL (ref 0.00–0.07)
Basophils Absolute: 0 10*3/uL (ref 0.0–0.1)
Basophils Relative: 0 %
Eosinophils Absolute: 0 10*3/uL (ref 0.0–0.5)
Eosinophils Relative: 0 %
HCT: 33.9 % — ABNORMAL LOW (ref 36.0–46.0)
Hemoglobin: 11.3 g/dL — ABNORMAL LOW (ref 12.0–15.0)
Immature Granulocytes: 0 %
Lymphocytes Relative: 46 %
Lymphs Abs: 1.3 10*3/uL (ref 0.7–4.0)
MCH: 31 pg (ref 26.0–34.0)
MCHC: 33.3 g/dL (ref 30.0–36.0)
MCV: 92.9 fL (ref 80.0–100.0)
Monocytes Absolute: 0.7 10*3/uL (ref 0.1–1.0)
Monocytes Relative: 25 %
Neutro Abs: 0.8 10*3/uL — ABNORMAL LOW (ref 1.7–7.7)
Neutrophils Relative %: 29 %
Platelets: 218 10*3/uL (ref 150–400)
RBC: 3.65 MIL/uL — ABNORMAL LOW (ref 3.87–5.11)
RDW: 12.7 % (ref 11.5–15.5)
WBC: 2.8 10*3/uL — ABNORMAL LOW (ref 4.0–10.5)
nRBC: 0 % (ref 0.0–0.2)

## 2020-06-06 LAB — COMPREHENSIVE METABOLIC PANEL
ALT: 11 U/L (ref 0–44)
AST: 28 U/L (ref 15–41)
Albumin: 4.3 g/dL (ref 3.5–5.0)
Alkaline Phosphatase: 47 U/L (ref 38–126)
Anion gap: 10 (ref 5–15)
BUN: 14 mg/dL (ref 8–23)
CO2: 30 mmol/L (ref 22–32)
Calcium: 8.8 mg/dL — ABNORMAL LOW (ref 8.9–10.3)
Chloride: 95 mmol/L — ABNORMAL LOW (ref 98–111)
Creatinine, Ser: 1.22 mg/dL — ABNORMAL HIGH (ref 0.44–1.00)
GFR calc Af Amer: 48 mL/min — ABNORMAL LOW (ref >60)
GFR calc non Af Amer: 41 mL/min — ABNORMAL LOW (ref >60)
Glucose, Bld: 112 mg/dL — ABNORMAL HIGH (ref 70–99)
Potassium: 3.5 mmol/L (ref 3.5–5.1)
Sodium: 135 mmol/L (ref 135–145)
Total Bilirubin: 0.9 mg/dL (ref 0.3–1.2)
Total Protein: 7.6 g/dL (ref 6.5–8.1)

## 2020-06-06 LAB — URINALYSIS, ROUTINE W REFLEX MICROSCOPIC
Bacteria, UA: NONE SEEN
Bilirubin Urine: NEGATIVE
Glucose, UA: NEGATIVE mg/dL
Ketones, ur: NEGATIVE mg/dL
Leukocytes,Ua: NEGATIVE
Nitrite: NEGATIVE
Protein, ur: 100 mg/dL — AB
Specific Gravity, Urine: 1.009 (ref 1.005–1.030)
pH: 6 (ref 5.0–8.0)

## 2020-06-06 MED ORDER — ONDANSETRON 4 MG PO TBDP: 4.0000 mg | ORAL_TABLET | Freq: Three times a day (TID) | ORAL | 0 refills | Status: DC | PRN

## 2020-06-06 NOTE — ED Provider Notes (Addendum)
[facility] DEPT Provider Note   CSN: [id number] Arrival date & time: 06/06/20  1210     History Chief Complaint  Patient presents with  . Loss of Consciousness    Kathy Cunningham is a 84 y.o. female.  Chief complaint syncopal episode x2 yesterday.  No prodromal symptoms; specifically no chest pain, dyspnea, neuro deficits, fever or chills.  She felt weak both yesterday and today.  Her glucose today was low.  Unknown number.  Orange juice was given which helped.  She is a type II diabetic and takes Glucophage 500 mg twice a day.  She is feeling much better now.        Past Medical History:  Diagnosis Date  . Anemia   . Diabetes mellitus    type II  . Dizziness    or vertigo  . Glaucoma   . Hyperlipidemia   . Hypertension     Patient Active Problem List   Diagnosis Date Noted  . Hearing loss 10/22/2019  . Lump of ear, right 09/26/2019  . Tinnitus aurium, right 09/26/2019  . Medicare annual wellness visit, subsequent 04/21/2019  . Initial Medicare annual wellness visit 06/15/2015  . Routine general medical examination at a health care facility 06/15/2015  . Estrogen deficiency 06/15/2015  . Breast cancer screening 12/17/2013  . Other screening mammogram 04/24/2011  . Glaucoma 04/24/2011  . Vitamin D deficiency 04/24/2011  . Anxiety 10/20/2010  . HYPOKALEMIA 10/22/2009  . Thalassemia 06/10/2008  . Osteoporosis 05/01/2008  . Diabetes type 2, controlled ([place]) 07/17/2007  . Hyperlipidemia associated with type 2 diabetes mellitus ([place]) 07/17/2007  . Anemia 07/17/2007  . Essential hypertension 07/17/2007  . PREMATURE VENTRICULAR CONTRACTIONS, FREQUENT 07/17/2007  . UTI'S, RECURRENT 07/17/2007    Past Surgical History:  Procedure Laterality Date  . bladder papilloma  2006  . ROTATOR CUFF REPAIR  01/2002  . vaginal hysterectomy       OB History   No obstetric history on file.     Family History  Problem Relation Age of Onset   . Heart disease Mother        MI  . Heart attack Mother   . Hypertension Mother   . Heart disease Father        MI  . Heart attack Father   . Hypertension Father   . Cancer Brother        pancreatic     Social History   Tobacco Use  . Smoking status: Never Smoker  . Smokeless tobacco: Never Used  Vaping Use  . Vaping Use: Never used  Substance Use Topics  . Alcohol use: No    Alcohol/week: 0.0 standard drinks  . Drug use: No    Home Medications Prior to Admission medications   Medication Sig Start Date End Date Taking? Authorizing Provider  ALPRAZolam Duanne Moron) 0.5 MG tablet 1/2 to 1 pill by mouth twice daily as needed for anxiety with travel, caution of sedation Patient taking differently: Take 0.25-0.5 mg by mouth 2 (two) times daily as needed for anxiety. 1/2 to 1 pill by mouth twice daily as needed for anxiety with travel, caution of sedation 02/09/20  Yes Tower, Wynelle Fanny, MD  amLODipine (NORVASC) 10 MG tablet Take 1 tablet (10 mg total) by mouth daily. 10/22/19  Yes Tower, Marne A, MD  bimatoprost (LUMIGAN) 0.01 % SOLN Place 1 drop into both eyes at bedtime.   Yes [provider]  brimonidine (ALPHAGAN P) 0.1 % SOLN Place 1  drop into both eyes 2 (two) times daily.    Yes [provider]  Cal Carb-Mag Hydrox-Simeth (ROLAIDS ADVANCED PO) Take 1 tablet by mouth as needed (indigestion).   Yes [provider]  calcium citrate (CALCITRATE - DOSED IN MG ELEMENTAL CALCIUM) 950 MG tablet Take 1 tablet by mouth daily.    Yes [provider]  cholecalciferol (VITAMIN D) 1000 UNITS tablet Take 2,000 Units by mouth daily.    Yes [provider]  fluticasone (FLONASE) 50 MCG/ACT nasal spray PLACE 2 SPRAYS INTO THE NOSE DAILY AS NEEDED. Patient taking differently: Place 2 sprays into both nostrils daily as needed for allergies or rhinitis. PLACE 2 SPRAYS INTO THE NOSE DAILY AS NEEDED. 04/16/18  Yes Tower, Wynelle Fanny, MD  hydrochlorothiazide  (HYDRODIURIL) 25 MG tablet TAKE 1 TABLET BY MOUTH  DAILY Patient taking differently: Take 25 mg by mouth daily.  04/30/20  Yes Tower, Wynelle Fanny, MD  metFORMIN (GLUCOPHAGE) 500 MG tablet TAKE 1 TABLET BY MOUTH  TWICE DAILY WITH MEALS Patient taking differently: Take 500 mg by mouth 2 (two) times daily with a meal.  04/30/20  Yes Tower, Wynelle Fanny, MD  metoprolol tartrate (LOPRESSOR) 100 MG tablet TAKE 1 TABLET BY MOUTH  TWICE DAILY Patient taking differently: Take 100 mg by mouth 2 (two) times daily.  04/30/20  Yes Tower, Wynelle Fanny, MD  olmesartan (BENICAR) 40 MG tablet TAKE 1 TABLET BY MOUTH  DAILY Patient taking differently: Take 40 mg by mouth daily.  04/30/20  Yes Tower, Wynelle Fanny, MD  potassium chloride SA (KLOR-CON) 20 MEQ tablet TAKE 1 TABLET BY MOUTH  DAILY Patient taking differently: Take 20 mEq by mouth daily. TAKE 1 TABLET BY MOUTH  DAILY 04/30/20  Yes Tower, Wynelle Fanny, MD  rosuvastatin (CRESTOR) 20 MG tablet TAKE 1 TABLET BY MOUTH  DAILY AT NOON Patient taking differently: Take 20 mg by mouth daily. TAKE 1 TABLET BY MOUTH  DAILY AT NOON 03/23/20  Yes Tower, Wynelle Fanny, MD  ONETOUCH VERIO test strip CHECK BLOOD SUGAR ONCE DAILY FOR DM (DX. E11.69) 04/07/20   Tower, Wynelle Fanny, MD    Allergies    Atorvastatin, Risedronate sodium, and Sulfonamide derivatives  Review of Systems   Review of Systems  All other systems reviewed and are negative.   Physical Exam Updated Vital Signs BP 100/67   Pulse 73   Temp 99 F (37.2 C) (Oral)   Resp 14   SpO2 100%   Physical Exam Vitals and nursing note reviewed.  Constitutional:      Appearance: She is well-developed.     Comments: No acute distress.  HENT:     Head: Normocephalic and atraumatic.  Eyes:     Conjunctiva/sclera: Conjunctivae normal.  Cardiovascular:     Rate and Rhythm: Normal rate and regular rhythm.  Pulmonary:     Effort: Pulmonary effort is normal.     Breath sounds: Normal breath sounds.  Abdominal:     General: Bowel sounds are  normal.     Palpations: Abdomen is soft.  Musculoskeletal:        General: Normal range of motion.     Cervical back: Neck supple.  Skin:    General: Skin is warm and dry.  Neurological:     General: No focal deficit present.     Mental Status: She is alert and oriented to person, place, and time.  Psychiatric:        Behavior: Behavior normal.  ED Results / Procedures / Treatments   Labs (all labs ordered are listed, but only abnormal results are displayed) Labs Reviewed  CBC WITH DIFFERENTIAL/PLATELET - Abnormal; Notable for the following components:      Result Value   WBC 2.8 (*)    RBC 3.65 (*)    Hemoglobin 11.3 (*)    HCT 33.9 (*)    Neutro Abs 0.8 (*)    All other components within normal limits  COMPREHENSIVE METABOLIC PANEL - Abnormal; Notable for the following components:   Chloride 95 (*)    Glucose, Bld 112 (*)    Creatinine, Ser 1.22 (*)    Calcium 8.8 (*)    GFR calc non Af Amer 41 (*)    GFR calc Af Amer 48 (*)    All other components within normal limits  URINALYSIS, ROUTINE W REFLEX MICROSCOPIC - Abnormal; Notable for the following components:   Hgb urine dipstick MODERATE (*)    Protein, ur 100 (*)    All other components within normal limits  PATHOLOGIST SMEAR REVIEW    EKG None  Date: 06/06/2020  Rate: 76  Rhythm: normal sinus rhythm  QRS Axis: normal  Intervals: normal  ST/T Wave abnormalities: normal  Conduction Disutrbances: none  Narrative Interpretation: unremarkable    Radiology No results found.  Procedures Procedures (including critical care time)  Medications Ordered in ED Medications - No data to display  ED Course  I have reviewed the triage vital signs and the nursing notes.  Pertinent labs & imaging results that were available during my care of the patient were reviewed by me and considered in my medical decision making (see chart for details).    MDM Rules/Calculators/A&P                          Patient  appears in no acute distress.  Basic vital signs within normal limits.  Glucose 209.  Will check labs, EKG, UA.  Recheck prior to discharge.  Patient is stable.  Discussed test results.  Follow-up with primary care. Final Clinical Impression(s) / ED Diagnoses Final diagnoses:  Hypoglycemia  Weakness  Syncope, unspecified syncope type    Rx / DC Orders ED Discharge Orders    None       Nat Christen, MD 06/06/20 1444    Nat Christen, MD 06/06/20 1545    Nat Christen, MD 06/06/20 [phone]

## 2020-06-06 NOTE — ED Triage Notes (Signed)
Per EMS, patient from home with c/o 2 syncopal episodes yesterday. Did not want to come to the ED yesterday and consulted with PCP this morning, who recommended she be seen here. Negative for orthostatic hypotension per EMS.   BP 130/60, 98% on RA, HR 74, RR 14, 209 CBG. Normal 12 lead EKG.

## 2020-06-06 NOTE — ED Notes (Signed)
ED Provider at bedside. 

## 2020-06-06 NOTE — Discharge Instructions (Addendum)
Tests were good.  If your sugar continues to be low in the morning, suggest cutting your evening dose of Metformin or Glucophage in half.  Follow-up with your primary care doctor.  Prescription for nausea medicine sent to your pharmacy.

## 2020-06-07 ENCOUNTER — Other Ambulatory Visit: Payer: Self-pay

## 2020-06-07 ENCOUNTER — Telehealth: Payer: Self-pay

## 2020-06-07 ENCOUNTER — Encounter: Payer: Self-pay | Admitting: Family Medicine

## 2020-06-07 ENCOUNTER — Ambulatory Visit (INDEPENDENT_AMBULATORY_CARE_PROVIDER_SITE_OTHER): Payer: Medicare Other | Admitting: Family Medicine

## 2020-06-07 VITALS — Temp 97.5°F | Ht 59.75 in | Wt 132.1 lb

## 2020-06-07 DIAGNOSIS — R55 Syncope and collapse: Secondary | ICD-10-CM | POA: Diagnosis not present

## 2020-06-07 DIAGNOSIS — E860 Dehydration: Secondary | ICD-10-CM | POA: Insufficient documentation

## 2020-06-07 DIAGNOSIS — D649 Anemia, unspecified: Secondary | ICD-10-CM | POA: Diagnosis not present

## 2020-06-07 DIAGNOSIS — I10 Essential (primary) hypertension: Secondary | ICD-10-CM

## 2020-06-07 LAB — PATHOLOGIST SMEAR REVIEW

## 2020-06-07 NOTE — Telephone Encounter (Signed)
[facility] Night - Client TELEPHONE ADVICE RECORD [facility] Patient Name: Kathy Cunningham Gender: Female DOB: 11/03/1935 Age: 84 Y 49 M 24 D Return Phone Number: [phone] (Primary), [phone] (Secondary) Address: City/State/Zip: [place] [address] Client [facility] Night - Client Client Site [facility] Physician Tower, Roque Lias - MD Contact Type Call Who Is Calling Patient / Member / Family / Caregiver Call Type Triage / Clinical Caller Name Justus Memory Relationship To Patient Daughter Return Phone Number [phone] (Primary) Chief Complaint FAINTING or [place] Reason for Call Symptomatic / Request for [place] states her mother had passed out today and her BP 122/58. Caller would like to know if her mother should take her medication or skip today and continue tomorrow. Translation No Nurse Assessment Nurse: Genelle Gather, RN, Magda Paganini Date/Time (Eastern Time): 06/05/2020 4:05:49 PM Confirm and document reason for call. If symptomatic, describe symptoms. ---Caller states her mother had passed out earlier today while driving today and her BP 122/58. Her blood pressure and blood sugar was low. Pt did not want to go the ER. Caller would like to know if her mother should take her medication or skip today and continue tomorrow. Does the patient have any new or worsening symptoms? ---Yes Will a triage be completed? ---Yes Related visit to physician within the last 2 weeks? ---No Does the PT have any chronic conditions? (i.e. diabetes, asthma, this includes High risk factors for pregnancy, etc.) ---Yes List chronic conditions. ---DM, HTN Is this a behavioral health or substance abuse call? ---No Guidelines Guideline Title Affirmed Question Affirmed Notes Nurse Date/Time (Eastern Time) Diabetes - Low Blood Sugar Low blood sugar prevention, questions about [place],  RN, Magda Paganini 06/05/2020 4:09:13 PM Disp. Time Eilene Ghazi Time) Disposition Final User 06/05/2020 4:03:31 PM Send to Urgent Paula Compton, [place] 06/05/2020 4:27:20 PM Call PCP when Office is Open Yes Huffine, RN, LesliePLEASE NOTE: All timestamps contained within this report are represented as [place] Standard Time. CONFIDENTIALTY NOTICE: This fax transmission is intended only for the addressee. It contains information that is legally privileged, confidential or otherwise protected from use or disclosure. If you are not the intended recipient, you are strictly prohibited from reviewing, disclosing, copying using or disseminating any of this information or taking any action in reliance on or regarding this information. If you have received this fax in error, please notify us immediately by telephone so that we can arrange for its return to [place]. Phone: [phone], Toll-Free: [phone], Fax: [phone] Page: 2 of 2 Call Id: [id number] Disposition Overriden: Home Care Override Reason: Patient's symptoms need a higher level of care Caller Disagree/Comply Comply Caller Understands Yes PreDisposition Call Doctor Care Advice Given Per Guideline CALL PCP WHEN OFFICE IS OPEN: * You need to discuss this with your doctor (or NP/PA) within the next few days. LOW BLOOD SUGAR (HYPOGLYCEMIA) - DEFINITION: * Low blood sugar (hypoglycemia) is defined as a blood glucose less than 70 mg/dL (3.9 mmol/L). CALL BACK IF: * Blood glucose under 70 mg/dL (3.9 mmol/L) and persists over 30 minutes * You become worse Comments User: Shelly Coss, RN Date/Time Eilene Ghazi Time): 06/05/2020 4:12:51 PM BS was 187 about 20 min ago. User: Shelly Coss, RN Date/Time Eilene Ghazi Time): 06/05/2020 4:30:55 PM Looked up current bp meds of Metoprolol, Amlodipine, HCTZ and Olmesartan and recommended pt continue taking meds as ordered. She takes the HCTZ at night and is up all night to urinate. Recommended discussing this with dr.  Pt had  not eaten this morning due to GI issues. Recommended she keep a log of blood sugar and blood pressure readings for dr.

## 2020-06-07 NOTE — Assessment & Plan Note (Signed)
Hypotensive with orthostatic changes (2 syncopal episodes this weekend) after overheating (suspect dehydration) Will hold her amlodipine, hctz, metoprolol and benicar today and tomorrow while she hydrated  Inst pt to call in 2 d and update re: symptoms and bp  May cut back on medication (amlodipine first) if needed

## 2020-06-07 NOTE — Assessment & Plan Note (Signed)
Seen in ER 10/4 -reassuring  Reviewed hospital records, lab results and studies in detail   Suspect due to overheating and dehydration  Discussed this in detail  Plan to hydrate orally and rest 2 d (family supervises), while holding bp meds (orthostatic today)  Then call to update  If not improved will continue to evaluate

## 2020-06-07 NOTE — Telephone Encounter (Signed)
[facility] Night - Client TELEPHONE ADVICE RECORD Access[facility] Patient Name: Kathy Cunningham Gender: Female DOB: 1936-08-04 Age: 84 Y 90 M 25 D Return Phone Number: [phone] (Primary) Address: City/State/Zip: [place] Client [place] Night - Client Client Site [facility] Physician Tower, Roque Lias - MD Contact Type Call Who Is Calling Patient / Member / Family / Caregiver Call Type Triage / Clinical Caller Name Justus Memory Relationship To Patient Daughter Return Phone Number [phone] (Primary) Chief Complaint Blood Pressure Low Reason for Call Symptomatic / Request for Mitchell reports her mother is having issues with her BP and it is 106/56. Caller is requesting advice as she passed out several times yesterday. Translation No Nurse Assessment Nurse: Cherre Robins, RN, Ria Comment Date/Time (Eastern Time): 06/06/2020 10:35:47 AM Confirm and document reason for call. If symptomatic, describe symptoms. ---Caller states her mother's BP is 106/56 and she passed out several times yesterday. Blood sugar 106. States she felt like she was going to pass out again today. No thermometer, but feels a little warm. Does the patient have any new or worsening symptoms? ---Yes Will a triage be completed? ---Yes Related visit to physician within the last 2 weeks? ---No Does the PT have any chronic conditions? (i.e. diabetes, asthma, this includes High risk factors for pregnancy, etc.) ---Yes List chronic conditions. ---Diabetes, HTN, High cholesterol, metabolic syndrome Is this a behavioral health or substance abuse call? ---No Guidelines Guideline Title Affirmed Question Affirmed Notes Nurse Date/Time (Eastern Time) Fainting Age > 50 years (Exception: occurred > 1 hour ago AND now feels completely fine) Weiss-Hilton, RN, Ria Comment 06/06/2020 10:47:49 AM Disp. Time Eilene Ghazi  Time) Disposition Final User 06/06/2020 10:59:34 AM 911 Outcome Documentation Weiss-Hilton, RN, Ria Comment Reason: Call rang and went to vociemail. 06/06/2020 10:52:12 AM Call EMS 911 Now Yes Weiss-Hilton, RN, Ria Comment PLEASE NOTE: All timestamps contained within this report are represented as [place] Standard Time. CONFIDENTIALTY NOTICE: This fax transmission is intended only for the addressee. It contains information that is legally privileged, confidential or otherwise protected from use or disclosure. If you are not the intended recipient, you are strictly prohibited from reviewing, disclosing, copying using or disseminating any of this information or taking any action in reliance on or regarding this information. If you have received this fax in error, please notify us immediately by telephone so that we can arrange for its return to [place]. Phone: [phone], Toll-Free: [phone], Fax: [phone] Page: 2 of 2 Call Id: [id number] Gerrard Disagree/Comply Comply Caller Understands Yes PreDisposition InappropriateToAsk Care Advice Given Per Guideline CALL EMS 911 NOW: * Immediate medical attention is needed. You need to hang up and call 911 (or an ambulance). * Triager Discretion: I'll call you back in a few minutes to be sure you were able to reach them. CARE ADVICE given per Fainting (Adult) guideline. Comments User: Carolan Clines, RN Date/Time Eilene Ghazi Time): 06/06/2020 10:56:12 AM Caller stated she is not with her mother right now that pt is with her sister and gave her sister's number [phone] and RN conferenced her in on the call.

## 2020-06-07 NOTE — Telephone Encounter (Signed)
I will see her then  

## 2020-06-07 NOTE — Assessment & Plan Note (Signed)
Stable in hospital recently

## 2020-06-07 NOTE — Patient Instructions (Addendum)
I think the fainting was from low blood pressure   Continue to hold your blood pressure medicines (amlodipine, hctz, metoprolol, benicar) today and tomorrow while you take it easy and drink lots of fluids Dehydration is a common cause of low bp and fainting   Please call in a few days to let me know how you feel and how bp is  Earlier if needed  If symptoms worsen please let us know right away

## 2020-06-07 NOTE — Assessment & Plan Note (Signed)
Suspect mild dehydration after working in heat (with syncope)  Orthostatic hypotension today  Plan to orally rehydrate at home/in air conditioning  Update in 2 d or earlier if needed Note cr was up to 1.22 in hospital-this may be cause

## 2020-06-07 NOTE — Progress Notes (Signed)
Subjective:    Patient ID: Kathy Cunningham, female    DOB: 05/04/1936, 84 y.o.   MRN: [id number]  This visit occurred during the SARS-CoV-2 public health emergency.  Safety protocols were in place, including screening questions prior to the visit, additional usage of staff PPE, and extensive cleaning of exam room while observing appropriate contact time as indicated for disinfecting solutions.    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  She felt bad, got a cup of ice from [employer] , then sitting in sun in the car- got hot and felt bad and then passed out  Someone came out with OJ   This happened twice  Went to the bathroom- got weak and hot    Glucose 109 after eating  EMS- 200  177 after eating today   Lab Results  Component Value Date   WBC 2.8 (L) 06/06/2020   HGB 11.3 (L) 06/06/2020   HCT 33.9 (L) 06/06/2020   MCV 92.9 06/06/2020   PLT 218 06/06/2020  anemia is baseline from [facility]    Lab Results  Component Value Date   NA 135 06/06/2020   K 3.5 06/06/2020   CO2 30 06/06/2020   GLUCOSE 112 (H) 06/06/2020   BUN 14 06/06/2020   CREATININE 1.22 (H) 06/06/2020   CALCIUM 8.8 (L) 06/06/2020   GFRNONAA 41 (L) 06/06/2020   GFRAA 48 (L) 06/06/2020   ekg- nsr rate of 76  bp is low at home 101/48 106/56 120/86 , 120/84 102/44   She has tried to drink more fluids today   BP Readings from Last 3 Encounters:  06/07/20 124/60  06/06/20 140/68  11/21/19 130/60   Pulse Readings from Last 3 Encounters:  06/07/20 74  06/06/20 80  11/21/19 66   Takes amlodipine 10 mg hctz 25 mg  Metoprolol  100 mg bid benicar 40 mg daily   Tends to have a lot of stomach issues  Anything she eats   Today  bp lying down 120/62 bp sitting 110/50  Patient Active Problem List   Diagnosis Date Noted  . Syncope 06/07/2020  . Dehydration 06/07/2020  . Hearing loss 10/22/2019  . Lump of ear, right 09/26/2019  . Tinnitus aurium, right 09/26/2019  . Medicare annual wellness visit, subsequent 04/21/2019  . Initial Medicare annual wellness visit 06/15/2015  . Routine general medical examination at a health care facility 06/15/2015  . Estrogen deficiency 06/15/2015  . Breast cancer screening 12/17/2013  . Other screening mammogram 04/24/2011  . Glaucoma 04/24/2011  . Vitamin D deficiency 04/24/2011  . Anxiety 10/20/2010  . HYPOKALEMIA 10/22/2009  . Thalassemia 06/10/2008  . Osteoporosis 05/01/2008  . Diabetes type 2, controlled ([place]) 07/17/2007  . Hyperlipidemia associated with type 2 diabetes mellitus ([place]) 07/17/2007  . Anemia 07/17/2007  . Essential hypertension 07/17/2007  . PREMATURE VENTRICULAR CONTRACTIONS, FREQUENT 07/17/2007  . UTI'S, RECURRENT 07/17/2007   Past Medical History:  Diagnosis Date  . Anemia   . Diabetes mellitus    type II  . Dizziness  or vertigo  . Glaucoma   . Hyperlipidemia   . Hypertension    Past Surgical History:  Procedure Laterality Date  . bladder papilloma  2006  . ROTATOR CUFF REPAIR  01/2002  . vaginal hysterectomy     Social History   Tobacco Use  . Smoking status: Never Smoker  . Smokeless tobacco: Never Used  Vaping Use  . Vaping Use: Never used  Substance Use Topics  . Alcohol use: No    Alcohol/week: 0.0 standard drinks  . Drug use: No   Family History  Problem Relation Age of Onset  . Heart disease Mother        MI  . Heart attack Mother   . Hypertension Mother   . Heart disease Father        MI  . Heart attack Father   . Hypertension Father   . Cancer Brother        pancreatic    Allergies  Allergen Reactions    . Atorvastatin Other (See Comments)    REACTION: cramps  . Risedronate Sodium Nausea And Vomiting       . Sulfonamide Derivatives Rash   Current Outpatient Medications on File Prior to Visit  Medication Sig Dispense Refill  . ALPRAZolam (XANAX) 0.5 MG tablet 1/2 to 1 pill by mouth twice daily as needed for anxiety with travel, caution of sedation (Patient taking differently: Take 0.25-0.5 mg by mouth 2 (two) times daily as needed for anxiety. 1/2 to 1 pill by mouth twice daily as needed for anxiety with travel, caution of sedation) 10 tablet 0  . amLODipine (NORVASC) 10 MG tablet Take 1 tablet (10 mg total) by mouth daily. 90 tablet 3  . bimatoprost (LUMIGAN) 0.01 % SOLN Place 1 drop into both eyes at bedtime.    . brimonidine (ALPHAGAN P) 0.1 % SOLN Place 1 drop into both eyes 2 (two) times daily.     . Cal Carb-Mag Hydrox-Simeth (ROLAIDS ADVANCED PO) Take 1 tablet by mouth as needed (indigestion).    . calcium citrate (CALCITRATE - DOSED IN MG ELEMENTAL CALCIUM) 950 MG tablet Take 1 tablet by mouth daily.     . cholecalciferol (VITAMIN D) 1000 UNITS tablet Take 2,000 Units by mouth daily.     . fluticasone (FLONASE) 50 MCG/ACT nasal spray PLACE 2 SPRAYS INTO THE NOSE DAILY AS NEEDED. (Patient taking differently: Place 2 sprays into both nostrils daily as needed for allergies or rhinitis. PLACE 2 SPRAYS INTO THE NOSE DAILY AS NEEDED.) 48 g 3  . hydrochlorothiazide (HYDRODIURIL) 25 MG tablet TAKE 1 TABLET BY MOUTH  DAILY (Patient taking differently: Take 25 mg by mouth daily. ) 90 tablet 3  . metFORMIN (GLUCOPHAGE) 500 MG tablet TAKE 1 TABLET BY MOUTH  TWICE DAILY WITH MEALS (Patient taking differently: Take 500 mg by mouth 2 (two) times daily with a meal. ) 180 tablet 3  . metoprolol tartrate (LOPRESSOR) 100 MG tablet TAKE 1 TABLET BY MOUTH  TWICE DAILY (Patient taking differently: Take 100 mg by mouth 2 (two) times daily. ) 180 tablet 3  . olmesartan (BENICAR) 40 MG tablet TAKE 1 TABLET BY  MOUTH  DAILY (Patient taking differently: Take 40 mg by mouth daily. ) 90 tablet 3  . ondansetron (ZOFRAN ODT) 4 MG disintegrating tablet Take 1 tablet (4 mg total) by mouth every 8 (eight) hours as needed for nausea or vomiting. 10 tablet 0  . ONETOUCH VERIO test strip CHECK BLOOD SUGAR ONCE DAILY FOR DM (  DX. E11.69) 100 strip 0  . potassium chloride SA (KLOR-CON) 20 MEQ tablet TAKE 1 TABLET BY MOUTH  DAILY (Patient taking differently: Take 20 mEq by mouth daily. TAKE 1 TABLET BY MOUTH  DAILY) 90 tablet 3  . rosuvastatin (CRESTOR) 20 MG tablet TAKE 1 TABLET BY MOUTH  DAILY AT NOON (Patient taking differently: Take 20 mg by mouth daily. TAKE 1 TABLET BY MOUTH  DAILY AT NOON) 90 tablet 3   No current facility-administered medications on file prior to visit.    Review of Systems  Constitutional: Positive for fatigue. Negative for activity change, appetite change, fever and unexpected weight change.  HENT: Negative for congestion, ear pain, rhinorrhea, sinus pressure and sore throat.   Eyes: Negative for pain, redness and visual disturbance.  Respiratory: Negative for cough, shortness of breath and wheezing.   Cardiovascular: Negative for chest pain and palpitations.  Gastrointestinal: Negative for abdominal pain, blood in stool, constipation and diarrhea.  Endocrine: Negative for polydipsia and polyuria.       Feels hot and cold at times since getting overheated  Genitourinary: Negative for dysuria, frequency and urgency.  Musculoskeletal: Negative for arthralgias, back pain and myalgias.  Skin: Negative for pallor and rash.  Allergic/Immunologic: Negative for environmental allergies.  Neurological: Positive for syncope and light-headedness. Negative for dizziness, tremors, seizures, facial asymmetry, speech difficulty, numbness and headaches.       General weakness (not focal)    Hematological: Negative for adenopathy. Does not bruise/bleed easily.  Psychiatric/Behavioral: Negative for  decreased concentration and dysphoric mood. The patient is not nervous/anxious.        Objective:   Physical Exam Constitutional:      General: She is not in acute distress.    Appearance: Normal appearance. She is well-developed. She is not ill-appearing or diaphoretic.  HENT:     Head: Normocephalic and atraumatic.     Right Ear: External ear normal.     Left Ear: External ear normal.     Nose: Nose normal.     Mouth/Throat:     Pharynx: No oropharyngeal exudate.     Comments: Mucous membranes are mildly dry Eyes:     General: No scleral icterus.       Right eye: No discharge.        Left eye: No discharge.     Conjunctiva/sclera: Conjunctivae normal.     Pupils: Pupils are equal, round, and reactive to light.     Comments: No nystagmus  Neck:     Thyroid: No thyromegaly.     Vascular: No carotid bruit or JVD.     Trachea: No tracheal deviation.  Cardiovascular:     Rate and Rhythm: Normal rate and regular rhythm.     Heart sounds: Normal heart sounds. No murmur heard.   Pulmonary:     Effort: Pulmonary effort is normal. No respiratory distress.     Breath sounds: Normal breath sounds. No wheezing or rales.  Abdominal:     General: Bowel sounds are normal. There is no distension.     Palpations: Abdomen is soft. There is no mass.     Tenderness: There is no abdominal tenderness.  Musculoskeletal:        General: No tenderness.     Cervical back: Full passive range of motion without pain, normal range of motion and neck supple.     Right lower leg: No edema.     Left lower leg: No edema.  Lymphadenopathy:  Cervical: No cervical adenopathy.  Skin:    General: Skin is warm and dry.     Coloration: Skin is not pale.     Findings: No erythema or rash.  Neurological:     Mental Status: She is alert and oriented to person, place, and time.     Cranial Nerves: No cranial nerve deficit.     Sensory: No sensory deficit.     Motor: No tremor, atrophy or abnormal muscle  tone.     Coordination: Coordination normal.     Gait: Gait normal.     Deep Tendon Reflexes: Reflexes are normal and symmetric.     Comments: No focal cerebellar signs   Psychiatric:        Mood and Affect: Mood normal.        Behavior: Behavior normal.        Thought Content: Thought content normal.           Assessment & Plan:   Problem List Items Addressed This Visit      Cardiovascular and Mediastinum   Essential hypertension    Hypotensive with orthostatic changes (2 syncopal episodes this weekend) after overheating (suspect dehydration) Will hold her amlodipine, hctz, metoprolol and benicar today and tomorrow while she hydrated  Inst pt to call in 2 d and update re: symptoms and bp  May cut back on medication (amlodipine first) if needed         Other   Anemia    Stable in hospital recently        Syncope - Primary    Seen in ER 10/4 -reassuring  Reviewed hospital records, lab results and studies in detail   Suspect due to overheating and dehydration  Discussed this in detail  Plan to hydrate orally and rest 2 d (family supervises), while holding bp meds (orthostatic today)  Then call to update  If not improved will continue to evaluate      Dehydration    Suspect mild dehydration after working in heat (with syncope)  Orthostatic hypotension today  Plan to orally rehydrate at home/in air conditioning  Update in 2 d or earlier if needed Note cr was up to 1.22 in hospital-this may be cause

## 2020-06-07 NOTE — Telephone Encounter (Signed)
Per chart review tab pt seen at [facility] ED on 06/06/20. Pt has appt today with Dr Glori Bickers at 12:30.

## 2020-06-14 ENCOUNTER — Telehealth: Payer: Self-pay

## 2020-06-14 NOTE — Telephone Encounter (Signed)
I will wait for bp readings Thanks

## 2020-06-14 NOTE — Telephone Encounter (Signed)
[facility] Night - Client TELEPHONE ADVICE RECORD Access[facility] Patient Name: Kathy Cunningham Gender: Female DOB: Dec 03, 1935 Age: 84 Y 47 M 1 D Return Phone Number: [phone] (Primary), [phone] (Secondary) Address: City/State/Zip: [place] [address] Client [facility] Night - Client Client Site [place] Physician Tower, Roque Lias - MD Contact Type Call Who Is Calling Patient / Member / Family / Caregiver Call Type Triage / Clinical Caller Name Kenney Houseman Cox Relationship To Patient Daughter Return Phone Number [phone] (Secondary) Chief Complaint Health information question (non symptomatic) Reason for Call Symptomatic / Request for [place] states she want to know if patient can drink pedialyte to keep hydrated. Translation No Nurse Assessment Nurse: Morey Hummingbird, RN, Caitlin Date/Time (Eastern Time): 06/12/2020 1:57:45 PM Confirm and document reason for call. If symptomatic, describe symptoms. ---Caller states pt went to the hospital about 2 weeks ago and went to the Dr past Monday and was dehydrated. Dr. took her off medications and stated she is dehydrated. Caller states pt does not have any worsening symptoms and is getting a little more energy. Caller wants to know if pt can have pedialyte. Does the patient have any new or worsening symptoms? ---Yes Will a triage be completed? ---Yes Related visit to physician within the last 2 weeks? ---Yes Does the PT have any chronic conditions? (i.e. diabetes, asthma, this includes High risk factors for pregnancy, etc.) ---Yes List chronic conditions. ---diabetes, HTN Is this a behavioral health or substance abuse call? ---No Guidelines Guideline Title Affirmed Question Affirmed Notes Nurse Date/Time (Eastern Time) Vision Loss or Change Single floater (i.e., small speck seems to float across the eye) (Exception: Floater(s)  are a chronic symptom and this is unchanged from patient's baseline pattern) Dulle, RN, Urban Gibson 06/12/2020 2:06:46 PM Weakness (Generalized) and Fatigue [1] MILD weakness (i.e., does not interfere with ability to work, go to [employer], Therapist, sports, Urban Gibson 06/12/2020 2:10:27 PM PLEASE NOTE: All timestamps contained within this report are represented as [place] Standard Time. CONFIDENTIALTY NOTICE: This fax transmission is intended only for the addressee. It contains information that is legally privileged, confidential or otherwise protected from use or disclosure. If you are not the intended recipient, you are strictly prohibited from reviewing, disclosing, copying using or disseminating any of this information or taking any action in reliance on or regarding this information. If you have received this fax in error, please notify us immediately by telephone so that we can arrange for its return to [place]. Phone: [phone], Toll-Free: [phone], Fax: [phone] Page: 2 of 2 Call Id: [id number] Guidelines Guideline Title Affirmed Question Affirmed Notes Nurse Date/Time Eilene Ghazi Time) school, normal activities) AND [2] persists > 1 week Disp. Time Eilene Ghazi Time) Disposition Final User 06/12/2020 1:46:40 PM Attempt made - no message left Morey Hummingbird, RN, Urban Gibson 06/12/2020 2:09:26 PM SEE PCP WITHIN 3 DAYS Morey Hummingbird, RN, Urban Gibson 06/12/2020 2:12:32 PM SEE PCP WITHIN 3 DAYS Yes Morey Hummingbird, RN, Docia Barrier Disagree/Comply Comply Caller Understands Yes PreDisposition InappropriateToAsk Care Advice Given Per Guideline SEE PCP WITHIN 3 DAYS: * You need to be seen within 2 or 3 days. CALL BACK IF: * Floaters increase in number (i.e., 'showers') * Floaters interfere with vision * A 'curtain comes down' and blocks vision * You become worse CARE ADVICE given per Vision Loss or Change (Adult) guideline. SEE PCP WITHIN 3 DAYS: * You need to be seen within 2 or 3 days. CALL BACK IF: * You become worse CARE ADVICE given  per  Weakness and Fatigue (Adult) guideline. Comments User: Rudell Cobb, RN Date/Time Eilene Ghazi Time): 06/12/2020 1:47:06 PM unable to leave voicemail due to callers voice mail box being full User: Rudell Cobb, RN Date/Time Eilene Ghazi Time): 06/12/2020 2:05:27 PM pt's blood glucose 164 after eating User: Rudell Cobb, RN Date/Time Eilene Ghazi Time): 06/12/2020 2:06:23 PM last BP 140/60 Referrals REFERRED TO PCP OFFICE

## 2020-06-14 NOTE — Telephone Encounter (Signed)
Pt called back so I advise her of Dr. Marliss Coots comments to restart both meds and keep [place] posted. Pt also asked if she was suppose to restart her metoprolol also, I asked Dr. Glori Bickers since she was in her office and she said "yes", I advised pt to restart all 3. I did ask pt if she wanted me to call her daughter and tell her also and pt said no she didn't want me to call her and she would take care of it.

## 2020-06-14 NOTE — Telephone Encounter (Signed)
Contacted pt and her daughter, Kathy Cunningham, who reports she was told she could use Pedialyte but she should mix it with water. She reports she had floaters once on 10/9 and once on 10/8 for about 1 hr each time. She also reports weakness but attributes this to the dehydration. Pt denies HA, loss of vision, weakness on one side or speech difficulty. Pt and daughter inquired what pt is to do about her BP meds. Advise pt is to give PCP BP reading. They said they were out right now and did not have them but would call back when they got home. Advised if any new symptoms developed to contact office and to also follow instructions given at [facility] concerning medications. Gave ER precautions. Pt and daughter verbalized understanding.

## 2020-06-14 NOTE — Telephone Encounter (Signed)
Daughter calling to give BP readings...  06/11/2020   140/60   BG 164- non fasting  06/14/2020  150/73 recheck 138/59        BG--129-- non fasting  Pt wants to know if she should restart meds, Metformin and Amlodipine

## 2020-06-14 NOTE — Telephone Encounter (Signed)
Yes, go ahead and re start them and alert me if any problems

## 2020-08-11 DIAGNOSIS — E119 Type 2 diabetes mellitus without complications: Secondary | ICD-10-CM | POA: Diagnosis not present

## 2020-08-11 DIAGNOSIS — H401132 Primary open-angle glaucoma, bilateral, moderate stage: Secondary | ICD-10-CM | POA: Diagnosis not present

## 2020-08-11 LAB — HM DIABETES EYE EXAM

## 2020-08-23 ENCOUNTER — Encounter: Payer: Self-pay | Admitting: Family Medicine

## 2020-11-05 ENCOUNTER — Other Ambulatory Visit: Payer: Self-pay | Admitting: Family Medicine

## 2021-02-01 ENCOUNTER — Encounter (HOSPITAL_COMMUNITY): Payer: Self-pay | Admitting: Emergency Medicine

## 2021-02-01 ENCOUNTER — Emergency Department (HOSPITAL_COMMUNITY): Payer: Medicare Other

## 2021-02-01 ENCOUNTER — Other Ambulatory Visit: Payer: Self-pay

## 2021-02-01 ENCOUNTER — Emergency Department (HOSPITAL_COMMUNITY)
Admission: EM | Admit: 2021-02-01 | Discharge: 2021-02-01 | Disposition: A | Discharge status: Home / Self Care | Payer: Medicare Other | Attending: Emergency Medicine | Admitting: Emergency Medicine

## 2021-02-01 DIAGNOSIS — E1169 Type 2 diabetes mellitus with other specified complication: Secondary | ICD-10-CM | POA: Diagnosis not present

## 2021-02-01 DIAGNOSIS — Z79899 Other long term (current) drug therapy: Secondary | ICD-10-CM | POA: Insufficient documentation

## 2021-02-01 DIAGNOSIS — E785 Hyperlipidemia, unspecified: Secondary | ICD-10-CM | POA: Insufficient documentation

## 2021-02-01 DIAGNOSIS — R002 Palpitations: Secondary | ICD-10-CM | POA: Insufficient documentation

## 2021-02-01 DIAGNOSIS — R42 Dizziness and giddiness: Secondary | ICD-10-CM | POA: Diagnosis not present

## 2021-02-01 DIAGNOSIS — I10 Essential (primary) hypertension: Secondary | ICD-10-CM | POA: Insufficient documentation

## 2021-02-01 DIAGNOSIS — Z7984 Long term (current) use of oral hypoglycemic drugs: Secondary | ICD-10-CM | POA: Diagnosis not present

## 2021-02-01 LAB — BASIC METABOLIC PANEL
Anion gap: 10 (ref 5–15)
BUN: 18 mg/dL (ref 8–23)
CO2: 26 mmol/L (ref 22–32)
Calcium: 9 mg/dL (ref 8.9–10.3)
Chloride: 102 mmol/L (ref 98–111)
Creatinine, Ser: 1.09 mg/dL — ABNORMAL HIGH (ref 0.44–1.00)
GFR, Estimated: 50 mL/min — ABNORMAL LOW (ref >60)
Glucose, Bld: 143 mg/dL — ABNORMAL HIGH (ref 70–99)
Potassium: 3.2 mmol/L — ABNORMAL LOW (ref 3.5–5.1)
Sodium: 138 mmol/L (ref 135–145)

## 2021-02-01 LAB — CBC
HCT: 34.5 % — ABNORMAL LOW (ref 36.0–46.0)
Hemoglobin: 11.1 g/dL — ABNORMAL LOW (ref 12.0–15.0)
MCH: 31 pg (ref 26.0–34.0)
MCHC: 32.2 g/dL (ref 30.0–36.0)
MCV: 96.4 fL (ref 80.0–100.0)
Platelets: 282 10*3/uL (ref 150–400)
RBC: 3.58 MIL/uL — ABNORMAL LOW (ref 3.87–5.11)
RDW: 13.5 % (ref 11.5–15.5)
WBC: 7.5 10*3/uL (ref 4.0–10.5)
nRBC: 0 % (ref 0.0–0.2)

## 2021-02-01 LAB — TROPONIN I (HIGH SENSITIVITY)
Troponin I (High Sensitivity): 9 ng/L (ref <18)
Troponin I (High Sensitivity): 25 ng/L — ABNORMAL HIGH (ref <18)

## 2021-02-01 MED ORDER — POTASSIUM CHLORIDE CRYS ER 20 MEQ PO TBCR
40.0000 meq | EXTENDED_RELEASE_TABLET | Freq: Once | ORAL | Status: AC
  Administered 2021-02-01: 40 meq via ORAL
  Filled 2021-02-01: qty 2

## 2021-02-01 NOTE — ED Triage Notes (Signed)
Pt from home c/o sudden onset of feeling like here heart was racing. Denies CP/SOb, Dizziness, lightheadedness. Pt states she was told she had a irregular heart rhythm years ago that "got better."

## 2021-02-01 NOTE — ED Provider Notes (Signed)
[facility] EMERGENCY DEPARTMENT Provider Note   CSN: [id number] Arrival date & time: 02/01/21  0206     History Chief Complaint  Patient presents with  . Palpitations    Kathy Cunningham is a 85 y.o. female.  Patient is an 85 year old female with past medical history of hypertension, hyperlipidemia, type 2 diabetes, anemia.  Patient presents today for evaluation of palpitations.  She states her heart began racing and beating forcefully just prior to presentation here.  She denies having any chest pain or difficulty breathing.  She describes similar episodes in the past, however no definitive arrhythmia has been identified.  She does report taking a dose of metoprolol after the onset of symptoms and this seemed to help.  She is completely asymptomatic now.  The history is provided by the patient.  Palpitations Palpitations quality:  Irregular Onset quality:  Sudden Duration:  30 minutes Timing:  Constant Progression:  Resolved Chronicity:  Recurrent Relieved by:  Beta blockers Worsened by:  Nothing Ineffective treatments:  None tried      Past Medical History:  Diagnosis Date  . Anemia   . Diabetes mellitus    type II  . Dizziness    or vertigo  . Glaucoma   . Hyperlipidemia   . Hypertension     Patient Active Problem List   Diagnosis Date Noted  . Syncope 06/07/2020  . Dehydration 06/07/2020  . Hearing loss 10/22/2019  . Lump of ear, right 09/26/2019  . Tinnitus aurium, right 09/26/2019  . Medicare annual wellness visit, subsequent 04/21/2019  . Initial Medicare annual wellness visit 06/15/2015  . Routine general medical examination at a health care facility 06/15/2015  . Estrogen deficiency 06/15/2015  . Breast cancer screening 12/17/2013  . Other screening mammogram 04/24/2011  . Glaucoma 04/24/2011  . Vitamin D deficiency 04/24/2011  . Anxiety 10/20/2010  . HYPOKALEMIA 10/22/2009  . Thalassemia 06/10/2008  . Osteoporosis 05/01/2008   . Diabetes type 2, controlled ([place]) 07/17/2007  . Hyperlipidemia associated with type 2 diabetes mellitus ([place]) 07/17/2007  . Anemia 07/17/2007  . Essential hypertension 07/17/2007  . PREMATURE VENTRICULAR CONTRACTIONS, FREQUENT 07/17/2007  . UTI'S, RECURRENT 07/17/2007    Past Surgical History:  Procedure Laterality Date  . bladder papilloma  2006  . ROTATOR CUFF REPAIR  01/2002  . vaginal hysterectomy       OB History   No obstetric history on file.     Family History  Problem Relation Age of Onset  . Heart disease Mother        MI  . Heart attack Mother   . Hypertension Mother   . Heart disease Father        MI  . Heart attack Father   . Hypertension Father   . Cancer Brother        pancreatic     Social History   Tobacco Use  . Smoking status: Never Smoker  . Smokeless tobacco: Never Used  Vaping Use  . Vaping Use: Never used  Substance Use Topics  . Alcohol use: No    Alcohol/week: 0.0 standard drinks  . Drug use: No    Home Medications Prior to Admission medications   Medication Sig Start Date End Date Taking? Authorizing Provider  ALPRAZolam Duanne Moron) 0.5 MG tablet 1/2 to 1 pill by mouth twice daily as needed for anxiety with travel, caution of sedation Patient taking differently: Take 0.25-0.5 mg by mouth 2 (two) times daily as needed for anxiety. 1/2 to  1 pill by mouth twice daily as needed for anxiety with travel, caution of sedation 02/09/20  Yes Tower, Roque Lias A, MD  amLODipine (NORVASC) 10 MG tablet TAKE 1 TABLET BY MOUTH EVERY DAY Patient taking differently: Take 10 mg by mouth daily. 11/05/20  Yes Tower, Marne A, MD  bimatoprost (LUMIGAN) 0.01 % SOLN Place 1 drop into both eyes at bedtime.   Yes [provider]  brimonidine (ALPHAGAN P) 0.1 % SOLN Place 1 drop into both eyes 2 (two) times daily.   Yes [provider]  Cal Carb-Mag Hydrox-Simeth (ROLAIDS ADVANCED PO) Take 1 tablet by mouth as needed (indigestion).   Yes [provider]  cholecalciferol (VITAMIN D) 1000 UNITS tablet Take 2,000 Units by mouth daily.    Yes [provider]  fluticasone (FLONASE) 50 MCG/ACT nasal spray PLACE 2 SPRAYS INTO THE NOSE DAILY AS NEEDED. Patient taking differently: Place 2 sprays into both nostrils daily as needed for allergies or rhinitis. PLACE 2 SPRAYS INTO THE NOSE DAILY AS NEEDED. 04/16/18  Yes Tower, Wynelle Fanny, MD  hydrochlorothiazide (HYDRODIURIL) 25 MG tablet TAKE 1 TABLET BY MOUTH  DAILY Patient taking differently: Take 25 mg by mouth daily. 04/30/20  Yes Tower, Wynelle Fanny, MD  metFORMIN (GLUCOPHAGE) 500 MG tablet TAKE 1 TABLET BY MOUTH  TWICE DAILY WITH MEALS Patient taking differently: Take 500 mg by mouth 2 (two) times daily with a meal. 04/30/20  Yes Tower, Wynelle Fanny, MD  metoprolol tartrate (LOPRESSOR) 100 MG tablet TAKE 1 TABLET BY MOUTH  TWICE DAILY Patient taking differently: Take 100 mg by mouth 2 (two) times daily. 04/30/20  Yes Tower, Wynelle Fanny, MD  olmesartan (BENICAR) 40 MG tablet TAKE 1 TABLET BY MOUTH  DAILY Patient taking differently: Take 40 mg by mouth daily. 04/30/20  Yes Tower, Wynelle Fanny, MD  ondansetron (ZOFRAN ODT) 4 MG disintegrating tablet Take 1 tablet (4 mg total) by mouth every 8 (eight) hours as needed for nausea or vomiting. 06/06/20  Yes Nat Christen, MD  [facility] VERIO test strip CHECK BLOOD SUGAR ONCE DAILY FOR DM (DX. E11.69) 04/07/20  Yes Tower, Wynelle Fanny, MD  potassium chloride SA (KLOR-CON) 20 MEQ tablet TAKE 1 TABLET BY MOUTH  DAILY Patient taking differently: Take 20 mEq by mouth daily. TAKE 1 TABLET BY MOUTH  DAILY 04/30/20  Yes Tower, Wynelle Fanny, MD  rosuvastatin (CRESTOR) 20 MG tablet TAKE 1 TABLET BY MOUTH  DAILY AT NOON Patient taking differently: Take 20 mg by mouth daily. TAKE 1 TABLET BY MOUTH  DAILY AT NOON 03/23/20  Yes Tower, Wynelle Fanny, MD    Allergies    Atorvastatin, Risedronate sodium, and Sulfonamide derivatives  Review of Systems   Review of Systems  Cardiovascular: Positive  for palpitations.  All other systems reviewed and are negative.   Physical Exam Updated Vital Signs BP (!) 182/88 (BP Location: Left Arm)   Pulse (!) 101   Temp 97.9 F (36.6 C) (Oral)   Resp 18   Ht 5\' 1"  (1.549 m)   Wt 59.9 kg   SpO2 98%   BMI 24.94 kg/m   Physical Exam Vitals and nursing note reviewed.  Constitutional:      General: She is not in acute distress.    Appearance: She is well-developed. She is not diaphoretic.  HENT:     Head: Normocephalic and atraumatic.  Cardiovascular:     Rate and Rhythm: Normal rate and regular rhythm.     Heart sounds: No murmur heard.  No friction rub. No gallop.   Pulmonary:     Effort: Pulmonary effort is normal. No respiratory distress.     Breath sounds: Normal breath sounds. No wheezing.  Abdominal:     General: Bowel sounds are normal. There is no distension.     Palpations: Abdomen is soft.     Tenderness: There is no abdominal tenderness.  Musculoskeletal:        General: Normal range of motion.     Cervical back: Normal range of motion and neck supple.  Skin:    General: Skin is warm and dry.  Neurological:     Mental Status: She is alert and oriented to person, place, and time.     ED Results / Procedures / Treatments   Labs (all labs ordered are listed, but only abnormal results are displayed) Labs Reviewed  CBC - Abnormal; Notable for the following components:      Result Value   RBC 3.58 (*)    Hemoglobin 11.1 (*)    HCT 34.5 (*)    All other components within normal limits  BASIC METABOLIC PANEL  TROPONIN I (HIGH SENSITIVITY)    EKG EKG Interpretation  Date/Time:  Tuesday Feb 01 2021 02:13:21 EDT Ventricular Rate:  99 PR Interval:  158 QRS Duration: 70 QT Interval:  328 QTC Calculation: 420 R Axis:   15 Text Interpretation: Sinus rhythm with occasional Premature ventricular complexes and Premature atrial complexes Otherwise normal ECG Confirmed by Veryl Speak [phone]) on 02/01/2021 3:22:33  AM   Radiology DG Chest 2 View  Result Date: 02/01/2021 CLINICAL DATA:  Palpitations, dizziness, lightheadedness EXAM: CHEST - 2 VIEW COMPARISON:  09/01/2017 FINDINGS: Frontal and lateral views of the chest demonstrate an unremarkable cardiac silhouette. No acute airspace disease, effusion, or pneumothorax. No acute bony abnormalities. IMPRESSION: 1. No acute intrathoracic process. Electronically Signed   By: Randa Ngo M.D.   On: 02/01/2021 02:39    Procedures Procedures   Medications Ordered in ED Medications - No data to display  ED Course  I have reviewed the triage vital signs and the nursing notes.  Pertinent labs & imaging results that were available during my care of the patient were reviewed by me and considered in my medical decision making (see chart for details).    MDM Rules/Calculators/A&P  Patient is an 85 year old female presenting with complaints of palpitations.  She describes a pounding in her chest that started this evening just prior to presentation.  Symptoms had resolved prior to her arriving here.  She arrives here in sinus rhythm with stable vital signs.  Initial EKG shows sinus rhythm with no acute findings.  There was a PVC and PAC noted on her EKG, however neither of these seem to coincide with her symptoms.  Work-up shows a potassium of 3.2.  This was replaced.  Laboratory studies are otherwise unremarkable initially.  A second troponin was obtained and went from 9-25.  I suspect a demand effect related to this episode of palpitations.  She is not experiencing any chest discomfort.  At this point, discharge seems appropriate.  Patient to follow-up with primary doctor and return if symptoms recur.  She may require a Holter monitor at some point in the future if this persists.  Final Clinical Impression(s) / ED Diagnoses Final diagnoses:  None    Rx / DC Orders ED Discharge Orders    None       Veryl Speak, MD 02/01/21 [phone]

## 2021-02-01 NOTE — Discharge Instructions (Addendum)
Continue medications as previously prescribed.  Follow-up with primary doctor in the next 1 to 2 weeks, and return to the ER if symptoms significantly worsen or change.

## 2021-02-09 ENCOUNTER — Telehealth: Payer: Self-pay | Admitting: Family Medicine

## 2021-02-09 ENCOUNTER — Other Ambulatory Visit: Payer: Self-pay | Admitting: Family Medicine

## 2021-02-09 NOTE — Chronic Care Management (AMB) (Signed)
  Chronic Care Management   Outreach Note  02/09/2021 Name: Kathy Cunningham MRN: [id number] DOB: 1936/02/16  Referred by: Tower, Wynelle Fanny, MD Reason for referral : No chief complaint on file.   An unsuccessful telephone outreach was attempted today. The patient was referred to the pharmacist for assistance with care management and care coordination.   Follow Up Plan:   Tatjana Dellinger Upstream Scheduler

## 2021-02-10 NOTE — Telephone Encounter (Signed)
Please schedule a f/u appt and refill until then

## 2021-02-10 NOTE — Telephone Encounter (Signed)
Crestor refilled once and will route to [facility] to schedule appt, (please schedule fasting labs prior to appt)

## 2021-02-10 NOTE — Telephone Encounter (Signed)
Last lipid lab was 10/17/19, and no future appts., please advise

## 2021-02-16 NOTE — Telephone Encounter (Signed)
Patient is scheduled   

## 2021-03-08 ENCOUNTER — Other Ambulatory Visit: Payer: Self-pay | Admitting: Family Medicine

## 2021-03-18 ENCOUNTER — Ambulatory Visit: Payer: Medicare Other | Attending: Internal Medicine

## 2021-03-18 ENCOUNTER — Other Ambulatory Visit: Payer: Self-pay

## 2021-03-18 ENCOUNTER — Other Ambulatory Visit (HOSPITAL_BASED_OUTPATIENT_CLINIC_OR_DEPARTMENT_OTHER): Payer: Self-pay

## 2021-03-18 DIAGNOSIS — Z23 Encounter for immunization: Secondary | ICD-10-CM

## 2021-03-18 MED ORDER — PFIZER-BIONT COVID-19 VAC-TRIS 30 MCG/0.3ML IM SUSP
INTRAMUSCULAR | 0 refills | Status: DC
  Filled 2021-03-18: qty 0.3, 1d supply, fill #0

## 2021-03-18 NOTE — Progress Notes (Signed)
   Covid-19 Vaccination Clinic  Name:  Kathy Cunningham    MRN: [id number] DOB: [birth year]  03/18/2021  [name] was observed post Covid-19 immunization for 15 minutes without incident. She was provided with Vaccine Information Sheet and instruction to access the V-Safe system.   [name] was instructed to call 911 with any severe reactions post vaccine: Difficulty breathing  Swelling of face and throat  A fast heartbeat  A bad rash all over body  Dizziness and weakness   Immunizations Administered     Name Date Dose VIS Date Route   PFIZER Comrnaty(Gray TOP) Covid-19 Vaccine 03/18/2021 10:49 AM 0.3 mL 08/12/2020 Intramuscular   Manufacturer: [place]   Lot: [id number]   [place]: [phone]

## 2021-03-28 ENCOUNTER — Telehealth: Payer: Self-pay | Admitting: Family Medicine

## 2021-03-28 NOTE — Chronic Care Management (AMB) (Signed)
  Chronic Care Management   Note  03/28/2021 Name: SOPHEY STREHLE MRN: [id number] DOB: 02-22-1936  STEPHANY STOCKS is a 85 y.o. year old female who is a primary care patient of Tower, Wynelle Fanny, MD. I reached out to Eleonore Chiquito by phone today in response to a referral sent by Ms. Cleophas Dunker Bruening's PCP, Tower, Wynelle Fanny, MD.   Ms. Munn was given information about Chronic Care Management services today including:  CCM service includes personalized support from designated clinical staff supervised by her physician, including individualized plan of care and coordination with other care providers 24/7 contact phone numbers for assistance for urgent and routine care needs. Service will only be billed when office clinical staff spend 20 minutes or more in a month to coordinate care. Only one practitioner may furnish and bill the service in a calendar month. The patient may stop CCM services at any time (effective at the end of the month) by phone call to the office staff.   Patient agreed to services and verbal consent obtained.   Follow up plan:   Tatjana Secretary/administrator

## 2021-03-29 ENCOUNTER — Ambulatory Visit (INDEPENDENT_AMBULATORY_CARE_PROVIDER_SITE_OTHER): Payer: Medicare Other | Admitting: Family Medicine

## 2021-03-29 ENCOUNTER — Encounter: Payer: Self-pay | Admitting: Family Medicine

## 2021-03-29 ENCOUNTER — Other Ambulatory Visit: Payer: Self-pay

## 2021-03-29 VITALS — BP 138/62 | HR 71 | Temp 98.0°F | Ht 60.0 in | Wt 128.4 lb

## 2021-03-29 DIAGNOSIS — E559 Vitamin D deficiency, unspecified: Secondary | ICD-10-CM

## 2021-03-29 DIAGNOSIS — M810 Age-related osteoporosis without current pathological fracture: Secondary | ICD-10-CM | POA: Diagnosis not present

## 2021-03-29 DIAGNOSIS — I10 Essential (primary) hypertension: Secondary | ICD-10-CM | POA: Diagnosis not present

## 2021-03-29 DIAGNOSIS — E1169 Type 2 diabetes mellitus with other specified complication: Secondary | ICD-10-CM | POA: Diagnosis not present

## 2021-03-29 DIAGNOSIS — E119 Type 2 diabetes mellitus without complications: Secondary | ICD-10-CM | POA: Diagnosis not present

## 2021-03-29 DIAGNOSIS — D568 Other thalassemias: Secondary | ICD-10-CM

## 2021-03-29 DIAGNOSIS — D649 Anemia, unspecified: Secondary | ICD-10-CM

## 2021-03-29 DIAGNOSIS — E2839 Other primary ovarian failure: Secondary | ICD-10-CM

## 2021-03-29 DIAGNOSIS — Z0000 Encounter for general adult medical examination without abnormal findings: Secondary | ICD-10-CM | POA: Diagnosis not present

## 2021-03-29 DIAGNOSIS — E785 Hyperlipidemia, unspecified: Secondary | ICD-10-CM

## 2021-03-29 LAB — CBC WITH DIFFERENTIAL/PLATELET
Basophils Absolute: 0 10*3/uL (ref 0.0–0.1)
Basophils Relative: 0.2 % (ref 0.0–3.0)
Eosinophils Absolute: 0.2 10*3/uL (ref 0.0–0.7)
Eosinophils Relative: 3.7 % (ref 0.0–5.0)
HCT: 35.3 % — ABNORMAL LOW (ref 36.0–46.0)
Hemoglobin: 11.5 g/dL — ABNORMAL LOW (ref 12.0–15.0)
Lymphocytes Relative: 36.1 % (ref 12.0–46.0)
Lymphs Abs: 1.6 10*3/uL (ref 0.7–4.0)
MCHC: 32.7 g/dL (ref 30.0–36.0)
MCV: 94.1 fl (ref 78.0–100.0)
Monocytes Absolute: 0.5 10*3/uL (ref 0.1–1.0)
Monocytes Relative: 11.2 % (ref 3.0–12.0)
Neutro Abs: 2.2 10*3/uL (ref 1.4–7.7)
Neutrophils Relative %: 48.8 % (ref 43.0–77.0)
Platelets: 286 10*3/uL (ref 150.0–400.0)
RBC: 3.75 Mil/uL — ABNORMAL LOW (ref 3.87–5.11)
RDW: 13.9 % (ref 11.5–15.5)
WBC: 4.6 10*3/uL (ref 4.0–10.5)

## 2021-03-29 LAB — LIPID PANEL
Cholesterol: 215 mg/dL — ABNORMAL HIGH (ref 0–200)
HDL: 78.2 mg/dL (ref >39.00)
LDL Cholesterol: 119 mg/dL — ABNORMAL HIGH (ref 0–99)
NonHDL: 136.56
Total CHOL/HDL Ratio: 3
Triglycerides: 90 mg/dL (ref 0.0–149.0)
VLDL: 18 mg/dL (ref 0.0–40.0)

## 2021-03-29 LAB — TSH: TSH: 1.09 u[IU]/mL (ref 0.35–5.50)

## 2021-03-29 LAB — COMPREHENSIVE METABOLIC PANEL
ALT: 8 U/L (ref 0–35)
AST: 16 U/L (ref 0–37)
Albumin: 4.4 g/dL (ref 3.5–5.2)
Alkaline Phosphatase: 64 U/L (ref 39–117)
BUN: 13 mg/dL (ref 6–23)
CO2: 30 mEq/L (ref 19–32)
Calcium: 9.6 mg/dL (ref 8.4–10.5)
Chloride: 103 mEq/L (ref 96–112)
Creatinine, Ser: 0.98 mg/dL (ref 0.40–1.20)
GFR: 53.42 mL/min — ABNORMAL LOW (ref >60.00)
Glucose, Bld: 105 mg/dL — ABNORMAL HIGH (ref 70–99)
Potassium: 4.1 mEq/L (ref 3.5–5.1)
Sodium: 140 mEq/L (ref 135–145)
Total Bilirubin: 0.6 mg/dL (ref 0.2–1.2)
Total Protein: 7.2 g/dL (ref 6.0–8.3)

## 2021-03-29 LAB — HEMOGLOBIN A1C: Hgb A1c MFr Bld: 6.1 % (ref 4.6–6.5)

## 2021-03-29 LAB — VITAMIN D 25 HYDROXY (VIT D DEFICIENCY, FRACTURES): VITD: 39.91 ng/mL (ref 30.00–100.00)

## 2021-03-29 MED ORDER — POTASSIUM CHLORIDE CRYS ER 20 MEQ PO TBCR
20.0000 meq | EXTENDED_RELEASE_TABLET | Freq: Every day | ORAL | 3 refills | Status: DC
Start: 2021-03-29 — End: 2022-02-01

## 2021-03-29 MED ORDER — METFORMIN HCL 500 MG PO TABS
500.0000 mg | ORAL_TABLET | Freq: Two times a day (BID) | ORAL | 3 refills | Status: DC
Start: 2021-03-29 — End: 2022-02-01

## 2021-03-29 MED ORDER — METOPROLOL TARTRATE 100 MG PO TABS: 100.0000 mg | ORAL_TABLET | Freq: Two times a day (BID) | ORAL | 3 refills | Status: DC

## 2021-03-29 MED ORDER — HYDROCHLOROTHIAZIDE 25 MG PO TABS
25.0000 mg | ORAL_TABLET | Freq: Every day | ORAL | 3 refills | Status: DC
Start: 2021-03-29 — End: 2022-03-06

## 2021-03-29 MED ORDER — OLMESARTAN MEDOXOMIL 40 MG PO TABS: 40.0000 mg | ORAL_TABLET | Freq: Every day | ORAL | 3 refills | Status: DC

## 2021-03-29 MED ORDER — ROSUVASTATIN CALCIUM 20 MG PO TABS
ORAL_TABLET | ORAL | 3 refills | Status: DC
Start: 2021-03-29 — End: 2022-01-12

## 2021-03-29 MED ORDER — AMLODIPINE BESYLATE 10 MG PO TABS
10.0000 mg | ORAL_TABLET | Freq: Every day | ORAL | 1 refills | Status: DC
Start: 2021-03-29 — End: 2021-05-13

## 2021-03-29 NOTE — Patient Instructions (Addendum)
Don't forget to schedule your mammogram   Wait a month after your covid booster to get shingles vaccine If you are interested in the new shingles vaccine (Shingrix) - call your local pharmacy to check on coverage and availability  If affordable, get on a wait list at your pharmacy to get the vaccine.  Please call and schedule your bone density test   Please work on advance directive (blue booklet)   Try to eat regular meals  Meal supplements like glucerna are fine as supplements

## 2021-03-29 NOTE — Progress Notes (Signed)
Subjective:    Patient ID: Kathy Cunningham, female    DOB: 08/03/1936, 85 y.o.   MRN: [id number]  This visit occurred during the SARS-CoV-2 public health emergency.  Safety protocols were in place, including screening questions prior to the visit, additional usage of staff PPE, and extensive cleaning of exam room while observing appropriate contact time as indicated for disinfecting solutions.   HPI Pt presents for amw and health mt visit with rev of chronic medical problems   I have personally reviewed the Medicare Annual Wellness questionnaire and have noted 1. The patient's medical and social history 2. Their use of alcohol, tobacco or illicit drugs 3. Their current medications and supplements 4. The patient's functional ability including ADL's, fall risks, home safety risks and hearing or visual             impairment. 5. Diet and physical activities 6. Evidence for depression or mood disorders  The patients weight, height, BMI have been recorded in the chart and visual acuity is per eye clinic.  I have made referrals, counseling and provided education to the patient based review of the above and I have provided the pt with a written personalized care plan for preventive services. Reviewed and updated provider list, see scanned forms.  See scanned forms.  Routine anticipatory guidance given to patient.  See health maintenance. 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   Memory is good Handles own affairs   PMH and SH reviewed  Meds, vitals, and allergies reviewed.   ROS: See HPI.  Otherwise negative.    Weight : Wt Readings from Last 3 Encounters:  03/29/21 128 lb 6 oz (58.2 kg)  02/01/21 132 lb (59.9 kg)  06/07/20 132 lb 1 oz (59.9 kg)   25.07 kg/m Skips mealt due to being busy  Does not know what she wants   Hearing/vision: Hearing Screening   '500Hz'$  '1000Hz'$  '2000Hz'$  '4000Hz'$   Right ear 40 40 40 0  Left ear 40 40 40 0  Vision Screening - Comments:: Dr. George Ina at [facility] last eye exam May 2022   Care team  Rayya Yagi-[facility]- [place]   Doing ok overall  Was upset then [facility] sent letter saying I was not covered (it was a mistake)   Had ER visit in may  Palpitations  Nl w/u and K of 3.2  They supplemented K  Then felt better  Never happened today    HTN  bp is stable today  No cp or palpitations or headaches or edema  No side effects to medicines  BP Readings from Last 3 Encounters:  03/29/21 138/62  02/01/21 (!) 148/65  06/06/20 140/68   Bp is good at home  Has not felt like it is up amlodipine 10 mg daily hctz 25 mg daily Metoprolol 100 mg bid olmesartan 40 mg daily   Pulse Readings from Last 3 Encounters:  03/29/21 71  02/01/21 80  06/06/20 80    We held medications for orthostasis in the fall  Currently  Amlodipine 10 mg daily  Hctz 25 mg daily  Metoprolol 100 mg bid Benicar 40 mg daily    Due for labs  Lab Results  Component Value Date   CREATININE 1.09 (H) 02/01/2021   BUN 18 02/01/2021   NA 138 02/01/2021   K 3.2 (L) 02/01/2021   CL 102 02/01/2021   CO2 26 02/01/2021     DM2 Lab Results  Component Value Date   HGBA1C 6.4 10/17/2019  Metformin 500 mg bid  On statin and arb   Hyperlipidemia Lab Results  Component Value Date   CHOL 172 10/17/2019   HDL 75.30 10/17/2019   LDLCALC 82 10/17/2019   TRIG 73.0 10/17/2019    CHOLHDL 2 10/17/2019   Rosuvastatin 20 mg daily   H/o anemia from thalassemia Lab Results  Component Value Date   WBC 7.5 02/01/2021   HGB 11.1 (L) 02/01/2021   HCT 34.5 (L) 02/01/2021   MCV 96.4 02/01/2021   PLT 282 02/01/2021   Patient Active Problem List   Diagnosis Date Noted   Syncope 06/07/2020   Dehydration 06/07/2020   Hearing loss 10/22/2019   Lump of ear, right 09/26/2019   Tinnitus aurium, right 09/26/2019   Medicare annual wellness visit, subsequent 04/21/2019   Initial Medicare annual wellness visit 06/15/2015   Routine general medical examination at a health care facility 06/15/2015   Estrogen deficiency 06/15/2015   Breast cancer screening 12/17/2013   Other screening mammogram 04/24/2011   Glaucoma 04/24/2011   Vitamin D deficiency 04/24/2011   Anxiety 10/20/2010   HYPOKALEMIA 10/22/2009   Other thalassemia ([place])    Osteoporosis 05/01/2008   Diabetes type 2, controlled ([place]) 07/17/2007   Hyperlipidemia associated with type 2 diabetes mellitus ([place]) 07/17/2007   Anemia 07/17/2007   Essential hypertension 07/17/2007   PREMATURE VENTRICULAR CONTRACTIONS, FREQUENT 07/17/2007   UTI'S, RECURRENT 07/17/2007   Past Medical History:  Diagnosis Date   Anemia    Diabetes mellitus    type II   Dizziness    or vertigo   Glaucoma    Hyperlipidemia    Hypertension    Past Surgical History:  Procedure Laterality Date   bladder papilloma  2006   ROTATOR CUFF REPAIR  01/2002   vaginal hysterectomy     Social History   Tobacco Use   Smoking status: Never   Smokeless tobacco: Never  Vaping Use   Vaping Use: Never used  Substance Use Topics   Alcohol use: No    Alcohol/week: 0.0 standard drinks   Drug use: No   Family History  Problem Relation Age of Onset   Heart disease Mother        MI   Heart attack Mother    Hypertension Mother    Heart disease Father        MI   Heart attack Father    Hypertension Father    Cancer Brother         pancreatic    Allergies  Allergen Reactions   Atorvastatin Other (See Comments)    REACTION: cramps   Risedronate Sodium Nausea And Vomiting        Sulfonamide Derivatives Rash   Current Outpatient Medications on File Prior to Visit  Medication Sig Dispense Refill   ALPRAZolam (XANAX) 0.5 MG tablet 1/2 to 1 pill by mouth twice daily as needed for anxiety with travel, caution of sedation (Patient taking differently:  Take 0.25-0.5 mg by mouth 2 (two) times daily as needed for anxiety. 1/2 to 1 pill by mouth twice daily as needed for anxiety with travel, caution of sedation) 10 tablet 0   bimatoprost (LUMIGAN) 0.01 % SOLN Place 1 drop into both eyes at bedtime.     brimonidine (ALPHAGAN P) 0.1 % SOLN Place 1 drop into both eyes 2 (two) times daily.     Cal Carb-Mag Hydrox-Simeth (ROLAIDS ADVANCED PO) Take 1 tablet by mouth as needed (indigestion).     cholecalciferol (VITAMIN D) 1000 UNITS tablet Take 2,000 Units by mouth daily.      fluticasone (FLONASE) 50 MCG/ACT nasal spray PLACE 2 SPRAYS INTO THE NOSE DAILY AS NEEDED. (Patient taking differently: Place 2 sprays into both nostrils daily as needed for allergies or rhinitis. PLACE 2 SPRAYS INTO THE NOSE DAILY AS NEEDED.) 48 g 3   No current facility-administered medications on file prior to visit.     Review of Systems  Constitutional:  Negative for activity change, appetite change, fatigue, fever and unexpected weight change.  HENT:  Negative for congestion, ear pain, rhinorrhea, sinus pressure and sore throat.   Eyes:  Negative for pain, redness and visual disturbance.  Respiratory:  Negative for cough, shortness of breath and wheezing.   Cardiovascular:  Negative for chest pain and palpitations.  Gastrointestinal:  Negative for abdominal pain, blood in stool, constipation and diarrhea.  Endocrine: Negative for polydipsia and polyuria.  Genitourinary:  Negative for dysuria, frequency and urgency.  Musculoskeletal:  Positive for  arthralgias. Negative for back pain and myalgias.  Skin:  Negative for pallor and rash.  Allergic/Immunologic: Negative for environmental allergies.  Neurological:  Negative for dizziness, syncope and headaches.  Hematological:  Negative for adenopathy. Does not bruise/bleed easily.  Psychiatric/Behavioral:  Negative for decreased concentration and dysphoric mood. The patient is not nervous/anxious.       Objective:   Physical Exam Constitutional:      General: She is not in acute distress.    Appearance: Normal appearance. She is well-developed and normal weight. She is not ill-appearing or diaphoretic.  HENT:     Head: Normocephalic and atraumatic.     Right Ear: Tympanic membrane, ear canal and external ear normal.     Left Ear: Tympanic membrane, ear canal and external ear normal.     Nose: Nose normal. No congestion.     Mouth/Throat:     Mouth: Mucous membranes are moist.     Pharynx: Oropharynx is clear. No posterior oropharyngeal erythema.  Eyes:     General: No scleral icterus.    Extraocular Movements: Extraocular movements intact.     Conjunctiva/sclera: Conjunctivae normal.     Pupils: Pupils are equal, round, and reactive to light.  Neck:     Thyroid: No thyromegaly.     Vascular: No carotid bruit or JVD.  Cardiovascular:     Rate and Rhythm: Normal rate and regular rhythm.     Pulses: Normal pulses.     Heart sounds: Normal heart sounds.    No gallop.  Pulmonary:     Effort: Pulmonary effort is normal. No respiratory distress.     Breath sounds: Normal breath sounds. No wheezing.     Comments: Good air exch Chest:     Chest wall: No tenderness.  Abdominal:     General: Bowel sounds are normal. There is no distension or abdominal bruit.     Palpations: Abdomen is soft. There is no mass.  Tenderness: There is no abdominal tenderness.     Hernia: No hernia is present.  Genitourinary:    Comments: Breast exam: No mass, nodules, thickening, tenderness,  bulging, retraction, inflamation, nipple discharge or skin changes noted.  No axillary or clavicular LA.     Musculoskeletal:        General: No tenderness. Normal range of motion.     Cervical back: Normal range of motion and neck supple. No rigidity. No muscular tenderness.     Right lower leg: No edema.     Left lower leg: No edema.  Lymphadenopathy:     Cervical: No cervical adenopathy.  Skin:    General: Skin is warm and dry.     Coloration: Skin is not pale.     Findings: No erythema or rash.     Comments: Few skin tags  Neurological:     Mental Status: She is alert. Mental status is at baseline.     Cranial Nerves: No cranial nerve deficit.     Motor: No abnormal muscle tone.     Coordination: Coordination normal.     Gait: Gait normal.     Deep Tendon Reflexes: Reflexes are normal and symmetric. Reflexes normal.  Psychiatric:        Mood and Affect: Mood normal.        Cognition and Memory: Cognition and memory normal.     Comments: Mentally sharp          Assessment & Plan:   Problem List Items Addressed This Visit       Cardiovascular and Mediastinum   Essential hypertension    bp in fair control at this time  BP Readings from Last 1 Encounters:  03/29/21 138/62  No changes needed Most recent labs reviewed  Disc lifstyle change with low sodium diet and exercise  Plan to continue amlodipine 10 mg daily hctz 25 mg daily Metoprolol 100 mg bid olmesartan 40 mg daily      Relevant Medications   rosuvastatin (CRESTOR) 20 MG tablet   amLODipine (NORVASC) 10 MG tablet   hydrochlorothiazide (HYDRODIURIL) 25 MG tablet   metoprolol tartrate (LOPRESSOR) 100 MG tablet   olmesartan (BENICAR) 40 MG tablet   Other Relevant Orders   CBC with Differential/Platelet (Completed)   Comprehensive metabolic panel (Completed)   Lipid panel (Completed)   TSH (Completed)     Endocrine   Diabetes type 2, controlled (HCC)    A1C today Metformin 500 mg bid  On statin and  arb Good foot care  Eye exam utd  Has been well controlled Disc diet -needs more protein calories       Relevant Medications   rosuvastatin (CRESTOR) 20 MG tablet   metFORMIN (GLUCOPHAGE) 500 MG tablet   olmesartan (BENICAR) 40 MG tablet   Other Relevant Orders   Hemoglobin A1c (Completed)   Hyperlipidemia associated with type 2 diabetes mellitus ([place])    Has been well controlled with diet and rosuvastatin 20 mg daily  Disc goals for lipids and reasons to control them Rev last labs with pt Rev low sat fat diet in detail Labs done today       Relevant Medications   rosuvastatin (CRESTOR) 20 MG tablet   metFORMIN (GLUCOPHAGE) 500 MG tablet   olmesartan (BENICAR) 40 MG tablet   Other Relevant Orders   Lipid panel (Completed)     Musculoskeletal and Integument   Osteoporosis    dexa 8/19 - ref done for recall No falls ro fx  Takes D  D level today  Active  Intolerant to oral bisphosphonates in the past  Disc need for calcium/ vitamin D/ wt bearing exercise and bone density test every 2 y to monitor Disc safety/ fracture risk in detail           Other   Anemia    From thalassemia  No symptoms  Cbc today       Relevant Orders   CBC with Differential/Platelet (Completed)   Other thalassemia ([place])   Vitamin D deficiency    Level today  Disc imp to bone and overall health       Relevant Orders   VITAMIN D 25 Hydroxy (Vit-D Deficiency, Fractures) (Completed)   Routine general medical examination at a health care facility    Reviewed health habits including diet and exercise and skin cancer prevention Reviewed appropriate screening tests for age  Also reviewed health mt list, fam hx and immunization status , as well as social and family history   See HPI Labs ordered  Info give so pt can schedule her mammogram and dexa covid immunized  Enc flu shot in the fall  Given materials and inst re: making advance directive No cognitive concerns, handles her own  affairs well Disc inc protein calories to mt wt Hearing screen is reassuring  Vision/eye care utd       Estrogen deficiency   Relevant Orders   DG Bone Density   Medicare annual wellness visit, subsequent - Primary    Reviewed health habits including diet and exercise and skin cancer prevention Reviewed appropriate screening tests for age  Also reviewed health mt list, fam hx and immunization status , as well as social and family history   See HPI Labs ordered  Info give so pt can schedule her mammogram and dexa covid immunized  Enc flu shot in the fall  Given materials and inst re: making advance directive No cognitive concerns, handles her own affairs well Disc inc protein calories to mt wt Hearing screen is reassuring  Vision/eye care utd

## 2021-03-30 NOTE — Assessment & Plan Note (Signed)
Has been well controlled with diet and rosuvastatin 20 mg daily  Disc goals for lipids and reasons to control them Rev last labs with pt Rev low sat fat diet in detail Labs done today

## 2021-03-30 NOTE — Assessment & Plan Note (Signed)
A1C today Metformin 500 mg bid  On statin and arb Good foot care  Eye exam utd  Has been well controlled Disc diet -needs more protein calories

## 2021-03-30 NOTE — Assessment & Plan Note (Signed)
From thalassemia  No symptoms  Cbc today

## 2021-03-30 NOTE — Assessment & Plan Note (Signed)
Reviewed health habits including diet and exercise and skin cancer prevention Reviewed appropriate screening tests for age  Also reviewed health mt list, fam hx and immunization status , as well as social and family history   See HPI Labs ordered  Info give so pt can schedule her mammogram and dexa covid immunized  Enc flu shot in the fall  Given materials and inst re: making advance directive No cognitive concerns, handles her own affairs well Disc inc protein calories to mt wt Hearing screen is reassuring  Vision/eye care utd

## 2021-03-30 NOTE — Assessment & Plan Note (Signed)
Level today  Disc imp to bone and overall health

## 2021-03-30 NOTE — Assessment & Plan Note (Signed)
bp in fair control at this time  BP Readings from Last 1 Encounters:  03/29/21 138/62   No changes needed Most recent labs reviewed  Disc lifstyle change with low sodium diet and exercise  Plan to continue amlodipine 10 mg daily hctz 25 mg daily Metoprolol 100 mg bid olmesartan 40 mg daily

## 2021-03-30 NOTE — Assessment & Plan Note (Addendum)
dexa 8/19 - ref done for recall No falls ro fx Takes D  D level today  Active  Intolerant to oral bisphosphonates in the past  Disc need for calcium/ vitamin D/ wt bearing exercise and bone density test every 2 y to monitor Disc safety/ fracture risk in detail

## 2021-04-14 ENCOUNTER — Other Ambulatory Visit: Payer: Self-pay | Admitting: Family Medicine

## 2021-04-14 DIAGNOSIS — Z1231 Encounter for screening mammogram for malignant neoplasm of breast: Secondary | ICD-10-CM

## 2021-05-05 ENCOUNTER — Telehealth: Payer: Self-pay

## 2021-05-05 NOTE — Chronic Care Management (AMB) (Addendum)
Chronic Care Management Pharmacy Assistant   Name: Kathy Cunningham  MRN: [id number] DOB: [birth year]  Kathy Cunningham is an 85 y.o. year old female who presents for her initial CCM visit with the clinical pharmacist.  Reason for Encounter: Initial Questions   Conditions to be addressed/monitored: HTN, HLD, and DMII  Recent office visits:  03/29/21 - PCP - Patient presented for AWV. Labs ordered new A1c 6.1. PCP concern about missing doses of Crestor. Patient confirmed dosing. Follow up 6 months.  Recent consult visits:  None in previous 6 months  Hospital visits:  02/01/21 - [facility] - Patient presented with chest palpitations. Labs ordered, xrays, EKG.   Medications: Outpatient Encounter Medications as of 05/05/2021  Medication Sig   ALPRAZolam (XANAX) 0.5 MG tablet 1/2 to 1 pill by mouth twice daily as needed for anxiety with travel, caution of sedation (Patient taking differently: Take 0.25-0.5 mg by mouth 2 (two) times daily as needed for anxiety. 1/2 to 1 pill by mouth twice daily as needed for anxiety with travel, caution of sedation)   amLODipine (NORVASC) 10 MG tablet Take 1 tablet (10 mg total) by mouth daily.   bimatoprost (LUMIGAN) 0.01 % SOLN Place 1 drop into both eyes at bedtime.   brimonidine (ALPHAGAN P) 0.1 % SOLN Place 1 drop into both eyes 2 (two) times daily.   Cal Carb-Mag Hydrox-Simeth (ROLAIDS ADVANCED PO) Take 1 tablet by mouth as needed (indigestion).   cholecalciferol (VITAMIN D) 1000 UNITS tablet Take 2,000 Units by mouth daily.    fluticasone (FLONASE) 50 MCG/ACT nasal spray PLACE 2 SPRAYS INTO THE NOSE DAILY AS NEEDED. (Patient taking differently: Place 2 sprays into both nostrils daily as needed for allergies or rhinitis. PLACE 2 SPRAYS INTO THE NOSE DAILY AS NEEDED.)   hydrochlorothiazide (HYDRODIURIL) 25 MG tablet Take 1 tablet (25 mg total) by mouth daily.   metFORMIN (GLUCOPHAGE) 500 MG tablet Take 1 tablet (500 mg total) by mouth 2 (two) times  daily with a meal.   metoprolol tartrate (LOPRESSOR) 100 MG tablet Take 1 tablet (100 mg total) by mouth 2 (two) times daily.   olmesartan (BENICAR) 40 MG tablet Take 1 tablet (40 mg total) by mouth daily.   potassium chloride SA (KLOR-CON) 20 MEQ tablet Take 1 tablet (20 mEq total) by mouth daily.   rosuvastatin (CRESTOR) 20 MG tablet TAKE 1 TABLET BY MOUTH  DAILY AT NOON   No facility-administered encounter medications on file as of 05/05/2021.     Lab Results  Component Value Date/Time   HGBA1C 6.1 03/29/2021 12:12 PM   HGBA1C 6.4 10/17/2019 08:57 AM   MICROALBUR 4.8 (H) 10/04/2009 09:15 AM     BP Readings from Last 3 Encounters:  03/29/21 138/62  02/01/21 (!) 148/65  06/06/20 140/68    Patient contacted to review initial questions prior to visit with Debbora Dus.  Have you seen any other providers since your last visit with PCP? No  Any changes in your medications or health? No  Any side effects from any medications? Yes The patient reports the hydrochlorothiazide made her have a black-out recently.  Do you have an symptoms or problems not managed by your medications? No  Any concerns about your health right now? Yes The patient has concerns regarding the blood pressure medications  Has your provider asked that you check blood pressure, blood sugar, or follow special diet at home? No The patient states she has not taken her BP or BG readings .  Do you get any type of exercise on a regular basis? The patient reports she does go out and water her plants daily, she grocery shops.  Can you think of a goal you would like to reach for your health? No  Do you have any problems getting your medications? No  Is there anything that you would like to discuss during the appointment? No   PAYCEN TECH was reminded to have all medications, supplements and any blood glucose and blood pressure readings available for review with Debbora Dus, Pharm. D, at her office visit on  05/10/2021 at 11:00am.    Star Rating Drugs:  Medication:  Last Fill: Day Supply Metformin '500mg'$  03/29/21 90 Olmesartan '40mg'$  03/29/21 90 Rosuvastatin '20mg'$  03/08/21  90   Care Gaps: None Last annual wellness visit:03/29/21  Debbora Dus, CPP notified  Avel Sensor, [place] Assistant [phone]  I have reviewed the care management and care coordination activities outlined in this encounter and I am certifying that I agree with the content of this note. Visit needs to be changed to telephone due to office relocation/closed temporarily.   Debbora Dus, PharmD Clinical Pharmacist [facility] [phone]

## 2021-05-10 ENCOUNTER — Ambulatory Visit (INDEPENDENT_AMBULATORY_CARE_PROVIDER_SITE_OTHER): Payer: Medicare Other

## 2021-05-10 ENCOUNTER — Other Ambulatory Visit: Payer: Self-pay

## 2021-05-10 ENCOUNTER — Other Ambulatory Visit: Payer: Self-pay | Admitting: Family Medicine

## 2021-05-10 DIAGNOSIS — E1169 Type 2 diabetes mellitus with other specified complication: Secondary | ICD-10-CM

## 2021-05-10 DIAGNOSIS — E119 Type 2 diabetes mellitus without complications: Secondary | ICD-10-CM

## 2021-05-10 DIAGNOSIS — I10 Essential (primary) hypertension: Secondary | ICD-10-CM

## 2021-05-10 NOTE — Patient Instructions (Signed)
May 10, 2021  Dear Kathy Cunningham,  It was a pleasure meeting you during our initial appointment on May 10, 2021. Below is a summary of the goals we discussed and components of chronic care management. Please contact me anytime with questions or concerns.   Visit Information   Patient Care Plan: CCM Pharmacy Care Plan     Problem Identified: CHL AMB "PATIENT-SPECIFIC PROBLEM"      Long-Range Goal: Disease Management   Start Date: 05/10/2021  Priority: High  Note:   Current Barriers:  Not taking medications at the same time daily Minimal appetite and poor food/water intake  Pt suspects dizziness from HCTZ and swelling in ankles from amlodipine   Pharmacist Clinical Goal(s):  Patient will achieve ability to self administer medications as prescribed through use of pill box as evidenced by patient report through collaboration with PharmD and provider.   Interventions: 1:1 collaboration with Tower, Kathy Fanny, MD regarding development and update of comprehensive plan of care as evidenced by provider attestation and co-signature Inter-disciplinary care team collaboration (see longitudinal plan of care) Comprehensive medication review performed; medication list updated in electronic medical record  Hypertension (BP goal <140/90) -Controlled - per clinic readings  -Pt concerns - states she feels dizzy with HCTZ (primarily upon standing). She also reports ankle swelling with amlodipine. She denies any recent falls. Last fall 3 years ago. She takes her medications sporadically throughout the day and sometimes take the HCTZ as late at 5 PM. She does not take them all together. -Current treatment: Amlodipine 10 mg - 1 daily in morning HCTZ 25 mg - 1 daily in morning Potassium 20 mEq - 1 daily in morning Metoprolol tartrate 100 mg - 1 twice daily with meals Olmesartan 40 mg - 1 daily in evening -Medications previously tried: none  -Current home readings: Pt reports her daughter  checks once monthly - keeps a log, hasnt been able to come recently. She states it was a little low on last check but does not recall numbers.  -Current dietary habits: see diabetes section -Current exercise habits: none formal  -Reports hypotensive/hypertensive symptoms - only dizziness -Educated on BP goals and benefits of medications for prevention of heart attack, stroke and kidney damage; Importance of home blood pressure monitoring; Symptoms of hypotension and importance of maintaining adequate hydration; -Counseled to monitor BP at home 2-3 days this week, document, and provide log at 1 week follow up call; Increase water intake. She current drinks water only when taking her medicines. She drinks about half a glass at these times.  -Recommended to continue current medication; Increase water intake. Drink Glucerna with morning meds. Take the HCTZ in the morning at the same time daily. See if this improve nighttime urination and dizziness. Will re-eval concern about swelling/dizziness once patient gets in a good routine of taking meds daily at same times each day.  Hyperlipidemia: (LDL goal < 100) -Not ideally controlled - LDL 119, adherence improved recently  -Current treatment: Rosuvastatin 20 mg daily in evening -Medications previously tried: none  -Educated on: importance of daily adherence -Recommended to continue current medication  Diabetes (A1c goal <7%) -Controlled - A1c 6.1% -Current medications: Metformin 500 mg - 1 tablet twice daily with meals -Medications previously tried: none  -Current home glucose readings - none reported -Denies hypoglycemic/hyperglycemic symptoms -Current meal patterns: poor appetite breakfast: skips (8 AM) lunch: sandwich (10 AM) dinner: skips snacks: apple, peach (around lunchtime) drinks: medium coke or sweet tea daily, water with medicine only -  Current exercise: none formal -Educated on Prevention and management of hypoglycemic  episodes; -Counseled to check feet daily and get yearly eye exams - Foot exam due; Pt denies any problems with feet today. -Recommended to continue current medication; Check fasting BG 2-3 days this week. Follow up call 1 week.  Patient Goals/Self-Care Activities Patient will:  - focus on medication adherence by using a pillbox  Follow Up Plan: The care management team will reach out to the patient again over the next 14 days.      Kathy Cunningham was given information about Chronic Care Management services today including:  CCM service includes personalized support from designated clinical staff supervised by her physician, including individualized plan of care and coordination with other care providers 24/7 contact phone numbers for assistance for urgent and routine care needs. Standard insurance, coinsurance, copays and deductibles apply for chronic care management only during months in which we provide at least 20 minutes of these services. Most insurances cover these services at 100%, however patients may be responsible for any copay, coinsurance and/or deductible if applicable. This service may help you avoid the need for more expensive face-to-face services. Only one practitioner may furnish and bill the service in a calendar month. The patient may stop CCM services at any time (effective at the end of the month) by phone call to the office staff.  Patient agreed to services and verbal consent obtained.   Patient verbalizes understanding of instructions provided today and agrees to view in [place].   Kathy Cunningham, PharmD Clinical Pharmacist [facility] at [facility] [phone]

## 2021-05-10 NOTE — Progress Notes (Signed)
Chronic Care Management Pharmacy Note  05/10/2021 Name:  Kathy Cunningham MRN:  [id number] DOB:  Mar 16, 1936  Summary: Initial CCM visit, reviewed all meds in person. Pt reports dizziness with standing thought to be from HCTZ. No falls. She takes meds throughout the day various times, no schedule. I think she would benefit from using a pillbox and taking meds same time each day (morning and evening meal). Currently minimal water intake and only eating sandwich and fruit daily. Encouraged resuming Glucerna. She may finish out Ensure supply first.  Recommendations/Changes made from today's visit: Check home BP and BG 2-3 days this week. Increase water intake and drink Glucerna for breakfast.   Plan: CMA call for BG/BP logs in 10 days.   Subjective: Kathy Cunningham is an 85 y.o. year old female who is a primary patient of Tower, Wynelle Fanny, MD.  The CCM team was consulted for assistance with disease management and care coordination needs.    Engaged with patient face to face for initial visit in response to provider referral for pharmacy case management and/or care coordination services.   Consent to Services:  The patient was given the following information about Chronic Care Management services today, agreed to services, and gave verbal consent: 1. CCM service includes personalized support from designated clinical staff supervised by the primary care provider, including individualized plan of care and coordination with other care providers 2. 24/7 contact phone numbers for assistance for urgent and routine care needs. 3. Service will only be billed when office clinical staff spend 20 minutes or more in a month to coordinate care. 4. Only one practitioner may furnish and bill the service in a calendar month. 5.The patient may stop CCM services at any time (effective at the end of the month) by phone call to the office staff. 6. The patient will be responsible for cost sharing (co-pay) of up to 20% of  the service fee (after annual deductible is met). Patient agreed to services and consent obtained.  Patient Care Team: Tower, Wynelle Fanny, MD as PCP - General Birder Robson, MD as Referring Physician (Ophthalmology) Debbora Dus, [facility] as Pharmacist (Pharmacist)  Recent office visits:  03/29/21 - PCP - Patient presented for AWV. Labs ordered new A1c 6.1%. LDL increased. Pt stopped Crestor for a while but restarted taking it about a week before her lab appt. Pt said she is back on med now and doesn't plan on missing anymore. Schedule DEXA scan and mammogram. Follow up 6 months.   Recent consult visits:  None in previous 6 months   Hospital visits:  02/01/21 - [facility] - Patient presented with chest palpitations. Improved with metoprolol. Potassium 3.2, replaced. Labs ordered, xrays, EKG. Follow up PCP. No admission.   Objective:  Lab Results  Component Value Date   CREATININE 0.98 03/29/2021   BUN 13 03/29/2021   GFR 53.42 (L) 03/29/2021   GFRNONAA 50 (L) 02/01/2021   GFRAA 48 (L) 06/06/2020   NA 140 03/29/2021   K 4.1 03/29/2021   CALCIUM 9.6 03/29/2021   CO2 30 03/29/2021   GLUCOSE 105 (H) 03/29/2021    Lab Results  Component Value Date/Time   HGBA1C 6.1 03/29/2021 12:12 PM   HGBA1C 6.4 10/17/2019 08:57 AM   GFR 53.42 (L) 03/29/2021 12:12 PM   GFR 64.24 10/17/2019 08:57 AM   MICROALBUR 4.8 (H) 10/04/2009 09:15 AM    Last diabetic Eye exam:  Lab Results  Component Value Date/Time   HMDIABEYEEXA No Retinopathy  08/11/2020 12:00 AM    Last diabetic Foot exam: 04/21/19 normal per chart  Lab Results  Component Value Date   CHOL 215 (H) 03/29/2021   HDL 78.20 03/29/2021   LDLCALC 119 (H) 03/29/2021   TRIG 90.0 03/29/2021   CHOLHDL 3 03/29/2021    Hepatic Function Latest Ref Rng & Units 03/29/2021 06/06/2020 10/17/2019  Total Protein 6.0 - 8.3 g/dL 7.2 7.6 6.8  Albumin 3.5 - 5.2 g/dL 4.4 4.3 4.2  AST 0 - 37 U/L _0 ALT 0 - 35 U/L _1 Alk Phosphatase 39  - 117 U/L 64 47 50  Total Bilirubin 0.2 - 1.2 mg/dL 0.6 0.9 0.6  Bilirubin, Direct 0.0 - 0.3 mg/dL - - -    Lab Results  Component Value Date/Time   TSH 1.09 03/29/2021 12:12 PM   TSH 1.38 04/15/2019 09:09 AM    CBC Latest Ref Rng & Units 03/29/2021 02/01/2021 06/06/2020  WBC 4.0 - 10.5 K/uL 4.6 7.5 2.8(L)  Hemoglobin 12.0 - 15.0 g/dL 11.5(L) 11.1(L) 11.3(L)  Hematocrit 36.0 - 46.0 % 35.3(L) 34.5(L) 33.9(L)  Platelets 150.0 - 400.0 K/uL 286.0 282 218    Lab Results  Component Value Date/Time   VD25OH 39.91 03/29/2021 12:12 PM   VD25OH 43.24 04/15/2019 09:09 AM    Clinical ASCVD: No  The ASCVD Risk score Mikey Bussing DC Jr., et al., 2013) failed to calculate for the following reasons:   The 2013 ASCVD risk score is only valid for ages 81 to 68    Depression screen PHQ 2/9 03/29/2021 04/21/2019 04/09/2018  Decreased Interest 0 0 2  Down, Depressed, Hopeless 0 0 0  PHQ - 2 Score 0 0 2  Altered sleeping - - 0  Tired, decreased energy - - 0  Change in appetite - - 0  Feeling bad or failure about yourself  - - 0  Trouble concentrating - - 1  Moving slowly or fidgety/restless - - 0  Suicidal thoughts - - 0  PHQ-9 Score - - 3  Difficult doing work/chores - - Not difficult at all    Social History   Tobacco Use  Smoking Status Never  Smokeless Tobacco Never   BP Readings from Last 3 Encounters:  03/29/21 138/62  02/01/21 (!) 148/65  06/06/20 140/68   Pulse Readings from Last 3 Encounters:  03/29/21 71  02/01/21 80  06/06/20 80   Wt Readings from Last 3 Encounters:  03/29/21 128 lb 6 oz (58.2 kg)  02/01/21 132 lb (59.9 kg)  06/07/20 132 lb 1 oz (59.9 kg)   BMI Readings from Last 3 Encounters:  03/29/21 25.07 kg/m  02/01/21 24.94 kg/m  06/07/20 26.01 kg/m    Assessment/Interventions: Review of patient past medical history, allergies, medications, health status, including review of consultants reports, laboratory and other test data, was performed as part of  comprehensive evaluation and provision of chronic care management services.   SDOH:  (Social Determinants of Health) assessments and interventions performed: Yes SDOH Interventions    Flowsheet Row Most Recent Value  SDOH Interventions   Financial Strain Interventions Intervention Not Indicated      SDOH Screenings   Alcohol Screen: Not on file  Depression (PHQ2-9): Low Risk    PHQ-2 Score: 0  Financial Resource Strain: Low Risk    Difficulty of Paying Living Expenses: Not very hard  Food Insecurity: Not on file  Housing: Not on file  Physical Activity: Not on file  Social Connections: Not on file  Stress: Not on file  Tobacco Use: Low Risk    Smoking Tobacco Use: Never   Smokeless Tobacco Use: Never  Transportation Needs: Not on file    CCM Care Plan  Allergies  Allergen Reactions   Atorvastatin Other (See Comments)    REACTION: cramps   Risedronate Sodium Nausea And Vomiting        Sulfonamide Derivatives Rash    Medications Reviewed Today     Reviewed by Debbora Dus, [facility] (Pharmacist) on 05/10/21 at 1213  Med List Status: <None>   Medication Order Taking? Sig Documenting Provider Last Dose Status Informant  ALPRAZolam (XANAX) 0.5 MG tablet [id number] Yes 1/2 to 1 pill by mouth twice daily as needed for anxiety with travel, caution of sedation  Patient taking differently: Take 0.25-0.5 mg by mouth 2 (two) times daily as needed for anxiety. 1/2 to 1 pill by mouth twice daily as needed for anxiety with travel, caution of sedation   Tower, Wynelle Fanny, MD Taking Active Self  amLODipine (NORVASC) 10 MG tablet [id number] Yes Take 1 tablet (10 mg total) by mouth daily. Tower, Wynelle Fanny, MD Taking Active   bimatoprost (LUMIGAN) 0.01 % SOLN [id number] Yes Place 1 drop into both eyes at bedtime. [provider] Taking Active Self  brimonidine (ALPHAGAN P) 0.1 % SOLN [id number] Yes Place 1 drop into both eyes 2 (two) times daily. [provider] Taking Active Self   Cal Carb-Mag Hydrox-Simeth (ROLAIDS ADVANCED PO) [id number] No Take 1 tablet by mouth as needed (indigestion).  Patient not taking: No sig reported   [provider] Not Taking Consider Medication Status and Discontinue (No longer needed (for PRN medications))   calcium carbonate (TUMS - DOSED IN MG ELEMENTAL CALCIUM) 500 MG chewable tablet [id number] Yes Chew 1 tablet by mouth daily as needed for indigestion or heartburn. [provider] Taking Active   cholecalciferol (VITAMIN D) 1000 UNITS tablet [id number] Yes Take 2,000 Units by mouth daily.  [provider] Taking Active Self  hydrochlorothiazide (HYDRODIURIL) 25 MG tablet [id number] Yes Take 1 tablet (25 mg total) by mouth daily. Tower, Wynelle Fanny, MD Taking Active   metFORMIN (GLUCOPHAGE) 500 MG tablet [id number] Yes Take 1 tablet (500 mg total) by mouth 2 (two) times daily with a meal. Tower, Wynelle Fanny, MD Taking Active   metoprolol tartrate (LOPRESSOR) 100 MG tablet [id number] Yes Take 1 tablet (100 mg total) by mouth 2 (two) times daily. Tower, Wynelle Fanny, MD Taking Active   olmesartan (BENICAR) 40 MG tablet [id number] Yes Take 1 tablet (40 mg total) by mouth daily. Tower, Wynelle Fanny, MD Taking Active   potassium chloride SA (KLOR-CON) 20 MEQ tablet [id number] Yes Take 1 tablet (20 mEq total) by mouth daily. Tower, Wynelle Fanny, MD Taking Active   rosuvastatin (CRESTOR) 20 MG tablet [id number] Yes TAKE 1 TABLET BY MOUTH  DAILY AT [facility], Wynelle Fanny, MD Taking Active             Patient Active Problem List   Diagnosis Date Noted   Syncope 06/07/2020   Dehydration 06/07/2020   Hearing loss 10/22/2019   Lump of ear, right 09/26/2019   Tinnitus aurium, right 09/26/2019   Medicare annual wellness visit, subsequent 04/21/2019   Initial Medicare annual wellness visit 06/15/2015   Routine general medical examination at a health care facility 06/15/2015   Estrogen deficiency 06/15/2015   Breast cancer screening 12/17/2013   Other  screening mammogram 04/24/2011   Glaucoma 04/24/2011   Vitamin D deficiency  04/24/2011   Anxiety 10/20/2010   HYPOKALEMIA 10/22/2009   Other thalassemia ([place])    Osteoporosis 05/01/2008   Diabetes type 2, controlled ([place]) 07/17/2007   Hyperlipidemia associated with type 2 diabetes mellitus ([place]) 07/17/2007   Anemia 07/17/2007   Essential hypertension 07/17/2007   PREMATURE VENTRICULAR CONTRACTIONS, FREQUENT 07/17/2007   UTI'S, RECURRENT 07/17/2007    Immunization History  Administered Date(s) Administered   Fluad Quad(high Dose 65+) 07/07/2019   Influenza, High Dose Seasonal PF 07/05/2018   Influenza,inj,Quad PF,6+ Mos 06/13/2013, 09/11/2014, 06/15/2015, 09/29/2016, 09/11/2017   PFIZER Comirnaty(Gray Top)Covid-19 Tri-Sucrose Vaccine 03/18/2021   PFIZER(Purple Top)SARS-COV-2 Vaccination 10/27/2019, 11/17/2019   Pneumococcal Conjugate-13 09/11/2014   Pneumococcal Polysaccharide-23 06/13/2013   Td 09/04/1993, 10/04/2009    Conditions to be addressed/monitored:  Hypertension, Hyperlipidemia, and Diabetes  Care Plan : [place]  Updates made by Debbora Dus, [place] since 05/10/2021 12:00 AM     Problem: CHL AMB "PATIENT-SPECIFIC PROBLEM"      Long-Range Goal: Disease Management   Start Date: 05/10/2021  Priority: High  Note:   Current Barriers:  Not taking medications at the same time daily Minimal appetite and poor food/water intake  Pt suspects dizziness from HCTZ and swelling in ankles from amlodipine   Pharmacist Clinical Goal(s):  Patient will achieve ability to self administer medications as prescribed through use of pill box as evidenced by patient report through collaboration with PharmD and provider.   Interventions: 1:1 collaboration with Tower, Wynelle Fanny, MD regarding development and update of comprehensive plan of care as evidenced by provider attestation and co-signature Inter-disciplinary care team collaboration (see longitudinal plan of  care) Comprehensive medication review performed; medication list updated in electronic medical record  Hypertension (BP goal <140/90) -Controlled - per clinic readings  -Pt concerns - states she feels dizzy with HCTZ (primarily upon standing). She also reports ankle swelling with amlodipine. She denies any recent falls. Last fall 3 years ago. She takes her medications sporadically throughout the day and sometimes take the HCTZ as late at 5 PM. She does not take them all together. -Current treatment: Amlodipine 10 mg - 1 daily in morning HCTZ 25 mg - 1 daily in morning Potassium 20 mEq - 1 daily in morning Metoprolol tartrate 100 mg - 1 twice daily with meals Olmesartan 40 mg - 1 daily in evening -Medications previously tried: none  -Current home readings: Pt reports her daughter checks once monthly - keeps a log, hasnt been able to come recently. She states it was a little low on last check but does not recall numbers.  -Current dietary habits: see diabetes section -Current exercise habits: none formal  -Reports hypotensive/hypertensive symptoms - only dizziness -Educated on BP goals and benefits of medications for prevention of heart attack, stroke and kidney damage; Importance of home blood pressure monitoring; Symptoms of hypotension and importance of maintaining adequate hydration; -Counseled to monitor BP at home 2-3 days this week, document, and provide log at 1 week follow up call; Increase water intake. She current drinks water only when taking her medicines. She drinks about half a glass at these times.  -Recommended to continue current medication; Increase water intake. Drink Glucerna with morning meds. Take the HCTZ in the morning at the same time daily. See if this improve nighttime urination and dizziness. Will re-eval concern about swelling/dizziness once patient gets in a good routine of taking meds daily at same times each day.  Hyperlipidemia: (LDL goal < 100) -Not ideally  controlled - LDL 119,  adherence improved recently  -Current treatment: Rosuvastatin 20 mg daily in evening -Medications previously tried: none  -Educated on: importance of daily adherence -Recommended to continue current medication  Diabetes (A1c goal <7%) -Controlled - A1c 6.1% -Current medications: Metformin 500 mg - 1 tablet twice daily with meals -Medications previously tried: none  -Current home glucose readings - none reported -Denies hypoglycemic/hyperglycemic symptoms -Current meal patterns: poor appetite breakfast: skips (8 AM) lunch: sandwich (10 AM) dinner: skips snacks: apple, peach (around lunchtime) drinks: medium coke or sweet tea daily, water with medicine only -Current exercise: none formal -Educated on Prevention and management of hypoglycemic episodes; -Counseled to check feet daily and get yearly eye exams - Foot exam due; Pt denies any problems with feet today. -Recommended to continue current medication; Check fasting BG 2-3 days this week. Follow up call 1 week.  Patient Goals/Self-Care Activities Patient will:  - focus on medication adherence by using a pillbox  Follow Up Plan: The care management team will reach out to the patient again over the next 14 days.        Medication Assistance: None required.  Patient affirms current coverage meets needs.  Compliance/Adherence/Medication fill history: Care Gaps: Mammogram (scheduled per pt) - she will schedule DEXA as well Diabetic foot exam - DUE  Star-Rating Drugs: Medication:                Last Fill:         Day Supply Metformin 565m        03/29/21            90 Olmesartan 464m      03/29/21            90 Rosuvastatin 2052m   03/08/21              90  Patient's [facility] pharmacy is:  [id number] [place] [facility] [address]: [phone]: [phone] Mail Service  ([facility] [facility] [place]: [phone]: [phone] pill box? No - she agrees to start Pt endorses 100% compliance   We discussed: Current pharmacy is preferred with insurance plan and patient is satisfied with pharmacy services Patient decided to: Continue current medication management strategy  OTCs: Vitamin D3 1000 IU daily in evening Vitamin C 1000 mg daily in evening  Other: Xanax - takes very rarely when traveling [place] 0.01% - daily at bedtime Alphagan P 0.1% - BID Calcium carbonate 500 mg - PRN indigestion   Care Plan and Follow Up Patient Decision:  Patient agrees to Care Plan and Follow-up.  MichDebbora DusarmD Clinical Pharmacist [phone]

## 2021-05-20 ENCOUNTER — Telehealth: Payer: Self-pay

## 2021-05-20 NOTE — Chronic Care Management (AMB) (Addendum)
Chronic Care Management Pharmacy Assistant   Name: ROSALINDA ROMANSKY  MRN: [id number] DOB: 09/18/1935   Reason for Encounter: HTN and Diabetes Review  Recent office visits:  None since last CCM contact  Recent consult visits:  None since last CCM contact  Hospital visits:  None in previous 6 months  Medications: Outpatient Encounter Medications as of 05/20/2021  Medication Sig   ALPRAZolam (XANAX) 0.5 MG tablet 1/2 to 1 pill by mouth twice daily as needed for anxiety with travel, caution of sedation (Patient taking differently: Take 0.25-0.5 mg by mouth 2 (two) times daily as needed for anxiety. 1/2 to 1 pill by mouth twice daily as needed for anxiety with travel, caution of sedation)   amLODipine (NORVASC) 10 MG tablet TAKE 1 TABLET BY MOUTH EVERY DAY   bimatoprost (LUMIGAN) 0.01 % SOLN Place 1 drop into both eyes at bedtime.   brimonidine (ALPHAGAN P) 0.1 % SOLN Place 1 drop into both eyes 2 (two) times daily.   Cal Carb-Mag Hydrox-Simeth (ROLAIDS ADVANCED PO) Take 1 tablet by mouth as needed (indigestion). (Patient not taking: No sig reported)   calcium carbonate (TUMS - DOSED IN MG ELEMENTAL CALCIUM) 500 MG chewable tablet Chew 1 tablet by mouth daily as needed for indigestion or heartburn.   cholecalciferol (VITAMIN D) 1000 UNITS tablet Take 2,000 Units by mouth daily.    hydrochlorothiazide (HYDRODIURIL) 25 MG tablet Take 1 tablet (25 mg total) by mouth daily.   metFORMIN (GLUCOPHAGE) 500 MG tablet Take 1 tablet (500 mg total) by mouth 2 (two) times daily with a meal.   metoprolol tartrate (LOPRESSOR) 100 MG tablet Take 1 tablet (100 mg total) by mouth 2 (two) times daily.   olmesartan (BENICAR) 40 MG tablet Take 1 tablet (40 mg total) by mouth daily.   potassium chloride SA (KLOR-CON) 20 MEQ tablet Take 1 tablet (20 mEq total) by mouth daily.   rosuvastatin (CRESTOR) 20 MG tablet TAKE 1 TABLET BY MOUTH  DAILY AT NOON   No facility-administered encounter medications on file as  of 05/20/2021.     Recent Relevant Labs: Lab Results  Component Value Date/Time   HGBA1C 6.1 03/29/2021 12:12 PM   HGBA1C 6.4 10/17/2019 08:57 AM   MICROALBUR 4.8 (H) 10/04/2009 09:15 AM    Kidney Function Lab Results  Component Value Date/Time   CREATININE 0.98 03/29/2021 12:12 PM   CREATININE 1.09 (H) 02/01/2021 02:22 AM   GFR 53.42 (L) 03/29/2021 12:12 PM   GFRNONAA 50 (L) 02/01/2021 02:22 AM   GFRAA 48 (L) 06/06/2020 03:08 PM     Current antihyperglycemic regimen:  Metformin 500 mg - 1 tablet twice daily with meals The patient reports taking medication as prescribed  What recent interventions/DTPs have been made to improve glycemic control:  check fasting BG's several days a week and make a log  Have there been any recent hospitalizations or ED visits since last visit with CPP? No  How often are you checking your blood sugar? once daily  What are your blood sugars ranging?  Before meals:   05/24/21  97 mid-day before lunch  Are you checking your feet daily/regularly? Yes  Adherence Review: Is the patient currently on a STATIN medication? Yes Is the patient currently on ACE/ARB medication? Yes Does the patient have >5 day gap between last estimated fill dates? No    Star Rating Drugs:  Medication:  Last Fill: Day Supply Metformin '500mg'$  03/29/21 90 Olmesartan '40mg'$  03/29/21 90 Rosuvastatin '20mg'$  03/08/21  90  No appointments scheduled within the next 30 days.    Recent Office Vitals: BP Readings from Last 3 Encounters:  03/29/21 138/62  02/01/21 (!) 148/65  06/06/20 140/68   Pulse Readings from Last 3 Encounters:  03/29/21 71  02/01/21 80  06/06/20 80    Wt Readings from Last 3 Encounters:  03/29/21 128 lb 6 oz (58.2 kg)  02/01/21 132 lb (59.9 kg)  06/07/20 132 lb 1 oz (59.9 kg)     Kidney Function Lab Results  Component Value Date/Time   CREATININE 0.98 03/29/2021 12:12 PM   CREATININE 1.09 (H) 02/01/2021 02:22 AM   GFR 53.42 (L) 03/29/2021 12:12  PM   GFRNONAA 50 (L) 02/01/2021 02:22 AM   GFRAA 48 (L) 06/06/2020 03:08 PM    BMP Latest Ref Rng & Units 03/29/2021 02/01/2021 06/06/2020  Glucose 70 - 99 mg/dL 105(H) 143(H) 112(H)  BUN 6 - 23 mg/dL '13 18 14  '$ Creatinine 0.40 - 1.20 mg/dL 0.98 1.09(H) 1.22(H)  BUN/Creat Ratio 11 - 26 - - -  Sodium 135 - 145 mEq/L 140 138 135  Potassium 3.5 - 5.1 mEq/L 4.1 3.2(L) 3.5  Chloride 96 - 112 mEq/L 103 102 95(L)  CO2 19 - 32 mEq/L '30 26 30  '$ Calcium 8.4 - 10.5 mg/dL 9.6 9.0 8.8(L)    Current antihypertensive regimen:  Amlodipine 10 mg - 1 daily in morning HCTZ 25 mg - 1 daily in morning Potassium 20 mEq - 1 daily in morning Metoprolol tartrate 100 mg - 1 twice daily with meals Olmesartan 40 mg - 1 daily in evening   How often are you checking your Blood Pressure?  monthly  Current home BP readings:  DATE:             BP               PULSE 05/24/21  144/50  -       (before lunch time )  What recent interventions/DTPs have been made by any provider to improve Blood Pressure control since last CPP Visit:  patient reports dizziness has improved  Any recent hospitalizations or ED visits since last visit with CPP? No  What diet changes have been made to improve Blood Pressure Control?  None identified  What exercise is being done to improve your Blood Pressure Control?  None identified  Adherence Review: Is the patient currently on ACE/ARB medication? Yes Does the patient have >5 day gap between last estimated fill dates? No   Star Rating Drugs:  Medication:  Last Fill: Day Supply Metformin '500mg'$  03/29/21 90 Olmesartan '40mg'$  03/29/21 90 Rosuvastatin '20mg'$  03/08/21  90  Care Gaps: Annual wellness visit in last year? Yes 03/29/21 Most Recent BP reading:138/62  71-P (03/29/21)   No appointments scheduled within the next 30 days.  The patient will keep BP and BG logs.  Debbora Dus, CPP notified  Avel Sensor, [place] Assistant [phone]  I have reviewed the  care management and care coordination activities outlined in this encounter and I am certifying that I agree with the content of this note. No further action required.  Debbora Dus, PharmD Clinical Pharmacist [name] at [facility] [phone]

## 2021-05-25 NOTE — Telephone Encounter (Signed)
Patient called to report readings but states she has actually misplaced her log. Daughter is coming to check BP and BG reading twice/week. BP - 144/50 (yesterday 05/24/21, before lunch), and BG was 97 (mid-day before lunch). She is taking her medicine exactly as instructed at our visit and feels much better having a routine. She feels much less dizzy and more energy.   She would like [place] to call back in about 2 weeks for BP and BG log.  Debbora Dus, PharmD Clinical Pharmacist [facility] at [facility] [phone]

## 2021-06-03 ENCOUNTER — Ambulatory Visit
Admission: RE | Admit: 2021-06-03 | Discharge: 2021-06-03 | Disposition: A | Discharge status: Home / Self Care | Payer: Medicare Other | Source: Ambulatory Visit | Attending: Family Medicine | Admitting: Family Medicine

## 2021-06-03 ENCOUNTER — Other Ambulatory Visit: Payer: Self-pay

## 2021-06-03 DIAGNOSIS — E1169 Type 2 diabetes mellitus with other specified complication: Secondary | ICD-10-CM

## 2021-06-03 DIAGNOSIS — Z1231 Encounter for screening mammogram for malignant neoplasm of breast: Secondary | ICD-10-CM

## 2021-06-03 DIAGNOSIS — E785 Hyperlipidemia, unspecified: Secondary | ICD-10-CM | POA: Diagnosis not present

## 2021-06-03 DIAGNOSIS — I10 Essential (primary) hypertension: Secondary | ICD-10-CM

## 2021-06-03 DIAGNOSIS — E119 Type 2 diabetes mellitus without complications: Secondary | ICD-10-CM | POA: Diagnosis not present

## 2021-06-06 ENCOUNTER — Telehealth: Payer: Self-pay

## 2021-06-06 NOTE — Progress Notes (Addendum)
Chronic Care Management Pharmacy Assistant   Name: Kathy Cunningham  MRN: [id number] DOB: [birth year]  Reason for Encounter: Diabetes and Hypertension Disease State  Recent office visits:  None since last CCM contact  Recent consult visits:  None since last CCM contact  Hospital visits:  None since last CCM contact  Medications: Outpatient Encounter Medications as of 06/06/2021  Medication Sig   ALPRAZolam (XANAX) 0.5 MG tablet 1/2 to 1 pill by mouth twice daily as needed for anxiety with travel, caution of sedation (Patient taking differently: Take 0.25-0.5 mg by mouth 2 (two) times daily as needed for anxiety. 1/2 to 1 pill by mouth twice daily as needed for anxiety with travel, caution of sedation)   amLODipine (NORVASC) 10 MG tablet TAKE 1 TABLET BY MOUTH EVERY DAY   bimatoprost (LUMIGAN) 0.01 % SOLN Place 1 drop into both eyes at bedtime.   brimonidine (ALPHAGAN P) 0.1 % SOLN Place 1 drop into both eyes 2 (two) times daily.   Cal Carb-Mag Hydrox-Simeth (ROLAIDS ADVANCED PO) Take 1 tablet by mouth as needed (indigestion). (Patient not taking: No sig reported)   calcium carbonate (TUMS - DOSED IN MG ELEMENTAL CALCIUM) 500 MG chewable tablet Chew 1 tablet by mouth daily as needed for indigestion or heartburn.   cholecalciferol (VITAMIN D) 1000 UNITS tablet Take 2,000 Units by mouth daily.    hydrochlorothiazide (HYDRODIURIL) 25 MG tablet Take 1 tablet (25 mg total) by mouth daily.   metFORMIN (GLUCOPHAGE) 500 MG tablet Take 1 tablet (500 mg total) by mouth 2 (two) times daily with a meal.   metoprolol tartrate (LOPRESSOR) 100 MG tablet Take 1 tablet (100 mg total) by mouth 2 (two) times daily.   olmesartan (BENICAR) 40 MG tablet Take 1 tablet (40 mg total) by mouth daily.   potassium chloride SA (KLOR-CON) 20 MEQ tablet Take 1 tablet (20 mEq total) by mouth daily.   rosuvastatin (CRESTOR) 20 MG tablet TAKE 1 TABLET BY MOUTH  DAILY AT NOON   No facility-administered encounter  medications on file as of 06/06/2021.     Recent Relevant Labs: Lab Results  Component Value Date/Time   HGBA1C 6.1 03/29/2021 12:12 PM   HGBA1C 6.4 10/17/2019 08:57 AM   MICROALBUR 4.8 (H) 10/04/2009 09:15 AM    Kidney Function Lab Results  Component Value Date/Time   CREATININE 0.98 03/29/2021 12:12 PM   CREATININE 1.09 (H) 02/01/2021 02:22 AM   GFR 53.42 (L) 03/29/2021 12:12 PM   GFRNONAA 50 (L) 02/01/2021 02:22 AM   GFRAA 48 (L) 06/06/2020 03:08 PM   Contacted patient on 06/14/2021 to discuss diabetes disease state.   Current antihyperglycemic regimen:  Metformin 500mg  - 1 tablet twice daily with meals.    Patient verbally confirms she is taking the above medications as directed. Yes  What diet changes have been made to improve diabetes control? Patient states she isn't doing anything to help her diabetes through diet.   What recent interventions/DTPs have been made to improve glycemic control:  Record blood glucose readings.   Have there been any recent hospitalizations or ED visits since last visit with CPP? No  Patient denies hypoglycemic symptoms, including Pale, Sweaty, Shaky, Hungry, Nervous/irritable, and Vision changes  Patient denies hyperglycemic symptoms, including blurry vision, excessive thirst, fatigue, polyuria, and weakness  How often are you checking your blood sugar? once daily sometimes in the morning and sometimes in the afternoon. She will start checking it in the morning before eats.   What are  your blood sugars ranging? Patient stated she checks it daily, but could only offer the readings below.  10/10 - 88 10/06 - 179 (after eating a lollipop in the morning) and 102 in the afternoon  During the week, how often does your blood glucose drop below 70? Never  Are you checking your feet daily/regularly? Yes  When she wakes up she is dizzy for a bit and then it goes away. Not all the time, but probably twice now. She walks around and it goes away.    Adherence Review: Is the patient currently on a STATIN medication? Yes Is the patient currently on ACE/ARB medication? Yes Does the patient have >5 day gap between last estimated fill dates? No   Recent Office Vitals: BP Readings from Last 3 Encounters:  03/29/21 138/62  02/01/21 (!) 148/65  06/06/20 140/68   Pulse Readings from Last 3 Encounters:  03/29/21 71  02/01/21 80  06/06/20 80    Wt Readings from Last 3 Encounters:  03/29/21 128 lb 6 oz (58.2 kg)  02/01/21 132 lb (59.9 kg)  06/07/20 132 lb 1 oz (59.9 kg)     Kidney Function Lab Results  Component Value Date/Time   CREATININE 0.98 03/29/2021 12:12 PM   CREATININE 1.09 (H) 02/01/2021 02:22 AM   GFR 53.42 (L) 03/29/2021 12:12 PM   GFRNONAA 50 (L) 02/01/2021 02:22 AM   GFRAA 48 (L) 06/06/2020 03:08 PM    BMP Latest Ref Rng & Units 03/29/2021 02/01/2021 06/06/2020  Glucose 70 - 99 mg/dL 105(H) 143(H) 112(H)  BUN 6 - 23 mg/dL 13 18 14   Creatinine 0.40 - 1.20 mg/dL 0.98 1.09(H) 1.22(H)  BUN/Creat Ratio 11 - 26 - - -  Sodium 135 - 145 mEq/L 140 138 135  Potassium 3.5 - 5.1 mEq/L 4.1 3.2(L) 3.5  Chloride 96 - 112 mEq/L 103 102 95(L)  CO2 19 - 32 mEq/L 30 26 30   Calcium 8.4 - 10.5 mg/dL 9.6 9.0 8.8(L)   Contacted patient on 06/14/2021 to discuss hypertension disease state  Current antihypertensive regimen:  Amlodipine 10mg  - Take 1 tablet by mouth in morning HCTZ 25mg  - Take 1 tablet by mouth in morning Metoprolol 100mg  - Take 1 tablet by mouth 2 times daily with meals Olmesartan 40mg  - Tale 1 tablet by mouth in evening Potassium 20 mEq - 1 daily in the morning  Patient verbally confirms she is taking the above medications as directed. Yes  How often are you checking your Blood Pressure? Daily- Patient stated she checks it daily, but could only offer the readings below.   Current home BP readings:  DATE:             BP               PULSE      10/10  141/86        10/09  132/66      10/07  126/68  Wrist or  arm cuff: Arm Caffeine intake: Patient does not drink caffeine Salt intake: Patient does not watch her salt intake OTC medications including pseudoephedrine or NSAIDs? No  Any readings above 180/120? No  What recent interventions/DTPs have been made by any provider to improve Blood Pressure control since last CPP Visit: Low sodium diet and exercise.   Any recent hospitalizations or ED visits since last visit with CPP?  No since last CCM contact  What diet changes have been made to improve Blood Pressure Control?  None  What exercise is  being done to improve your Blood Pressure Control?  Walks because she feels like a brand new person since changing when she takes her medication.   Adherence Review: Is the patient currently on ACE/ARB medication? Yes Does the patient have >5 day gap between last estimated fill dates? No  Star Rating Drugs:  Medication:  Last Fill: Day Supply Olmesartan 40mg  03/29/2021 90 Rosuvastatin 20mg  06/01/2021 90   Metformin 500mg  03/29/2021 90  Care Gaps: Annual wellness visit in last year? Yes 03/29/2021 Most Recent BP reading: 138/62 on 03/29/2021  If Diabetic: Most recent A1C reading: 6.1 on 03/29/2021 Last eye exam / retinopathy screening: 08/11/2020 Last diabetic foot exam: 04/21/2019  Patient was having a difficult time reporting her Blood Sugar and Blood Pressure readings to me. I asked patient if it would be helpful if I sent her a log that she could use. Patient was very excited about the idea and would be happy to use it. I advised patient to check her blood sugar in the morning before she had anything to eat. Patient gave me a reading taken after she had eaten candy.   No appointments scheduled within the next 30 days.  Debbora Dus, CPP notified  [name], [place] Clinical Pharmacy Assistant [phone]   Time Spent: 83 Minutes I have reviewed the care management and care coordination activities outlined in this encounter and I am  certifying that I agree with the content of this note. No further action required.  Debbora Dus, PharmD Clinical Pharmacist [facility] at [facility] [phone]

## 2021-06-22 ENCOUNTER — Ambulatory Visit (INDEPENDENT_AMBULATORY_CARE_PROVIDER_SITE_OTHER)
Admission: RE | Admit: 2021-06-22 | Discharge: 2021-06-22 | Disposition: A | Discharge status: Home / Self Care | Payer: Medicare Other | Source: Ambulatory Visit | Attending: Family Medicine | Admitting: Family Medicine

## 2021-06-22 ENCOUNTER — Other Ambulatory Visit: Payer: Self-pay

## 2021-06-22 DIAGNOSIS — E2839 Other primary ovarian failure: Secondary | ICD-10-CM | POA: Diagnosis not present

## 2021-06-30 NOTE — Progress Notes (Signed)
A copy of a BG log and BP log printed and mailed to the patient to help track her readings.  Debbora Dus, CPP notified  Avel Sensor, [place] Assistant [phone]  Total time spent for month CPA: 10 min.

## 2021-07-11 ENCOUNTER — Other Ambulatory Visit: Payer: Self-pay

## 2021-07-11 ENCOUNTER — Ambulatory Visit (INDEPENDENT_AMBULATORY_CARE_PROVIDER_SITE_OTHER): Payer: Medicare Other | Admitting: Family Medicine

## 2021-07-11 ENCOUNTER — Encounter: Payer: Self-pay | Admitting: Family Medicine

## 2021-07-11 VITALS — BP 130/66 | HR 81 | Temp 98.0°F | Ht 60.0 in | Wt 128.0 lb

## 2021-07-11 DIAGNOSIS — M810 Age-related osteoporosis without current pathological fracture: Secondary | ICD-10-CM | POA: Diagnosis not present

## 2021-07-11 DIAGNOSIS — Z23 Encounter for immunization: Secondary | ICD-10-CM

## 2021-07-11 DIAGNOSIS — E559 Vitamin D deficiency, unspecified: Secondary | ICD-10-CM | POA: Diagnosis not present

## 2021-07-11 MED ORDER — ALENDRONATE SODIUM 70 MG PO TABS: 70.0000 mg | ORAL_TABLET | ORAL | 3 refills | Status: DC

## 2021-07-11 NOTE — Patient Instructions (Addendum)
Please start alendronate one pill weekly as directed If any side effects including stomach upset or heartburn please stop it and let me know   Take it first thing in the am  30 minutes before food or other medicine with a glass of water Do not lie back down   If you develop any significant dental problems and need extraction or implants- hold the medicine until are healed

## 2021-07-11 NOTE — Progress Notes (Signed)
Subjective:    Patient ID: Kathy Cunningham, female    DOB: Mar 28, 1936, 85 y.o.   MRN: [id number]  This visit occurred during the SARS-CoV-2 public health emergency.  Safety protocols were in place, including screening questions prior to the visit, additional usage of staff PPE, and extensive cleaning of exam room while observing appropriate contact time as indicated for disinfecting solutions.   HPI Pt presents for f/u of osteoporosis  Also flu shot  Wt Readings from Last 3 Encounters:  07/11/21 128 lb (58.1 kg)  03/29/21 128 lb 6 oz (58.2 kg)  02/01/21 132 lb (59.9 kg)   25.00 kg/m  Doing ok overall   Continues to have right hip pain (when raining)     Dexa 06/2021 Results:   Lumbar spine L1-L4(L3) Femoral neck (FN)  T-score   -1.2 RFN: -2.7 LFN: -3.0  Change in BMD from previous DXA test (%) Up 0.3% Down 8.7%  (*) statistically significant   Assessment: Patient has OSTEOPOROSIS according to the [facility] classification for osteoporosis (see below).  Femoral neck score is down 8.7% In OP range   Falls -none  Fractures - none  She has lost some height   Supplements : Vit D 2000 iu daily - seldom misses a dose  Last vit D level was 39.9 in July   Exercise : goes to the [employer] and walks around and then in the parking lot  Walks any time she can    Lab Results  Component Value Date   TSH 1.09 03/29/2021    She took risendronate in the past (oral) and had nausea/vomiting She did however take for 5 y before having to stop it   Dental history :  Filled a cavity recently  No extractions   Mammogram utd 05/2021  Patient Active Problem List   Diagnosis Date Noted   Syncope 06/07/2020   Dehydration 06/07/2020   Hearing loss 10/22/2019   Lump of ear, right 09/26/2019   Tinnitus aurium, right 09/26/2019   Medicare annual wellness visit, subsequent 04/21/2019   Initial Medicare annual wellness visit 06/15/2015   Routine general medical examination at a health care  facility 06/15/2015   Estrogen deficiency 06/15/2015   Breast cancer screening 12/17/2013   Other screening mammogram 04/24/2011   Glaucoma 04/24/2011   Vitamin D deficiency 04/24/2011   Anxiety 10/20/2010   HYPOKALEMIA 10/22/2009   Other thalassemia ([place])    Osteoporosis 05/01/2008   Diabetes type 2, controlled ([place]) 07/17/2007   Hyperlipidemia associated with type 2 diabetes mellitus ([place]) 07/17/2007   Anemia 07/17/2007   Essential hypertension 07/17/2007   PREMATURE VENTRICULAR CONTRACTIONS, FREQUENT 07/17/2007   UTI'S, RECURRENT 07/17/2007   Past Medical History:  Diagnosis Date   Anemia    Diabetes mellitus    type II   Dizziness    or vertigo   Glaucoma    Hyperlipidemia    Hypertension    Past Surgical History:  Procedure Laterality Date   bladder papilloma  2006   ROTATOR CUFF REPAIR  01/2002   vaginal hysterectomy     Social History   Tobacco Use   Smoking status: Never   Smokeless tobacco: Never  Vaping Use   Vaping Use: Never used  Substance Use Topics   Alcohol use: No    Alcohol/week: 0.0 standard drinks   Drug use: No   Family History  Problem Relation Age of Onset   Heart disease Mother        MI   Heart  attack Mother    Hypertension Mother    Heart disease Father        MI   Heart attack Father    Hypertension Father    Cancer Brother        pancreatic    Allergies  Allergen Reactions   Atorvastatin Other (See Comments)    REACTION: cramps   Sulfonamide Derivatives Rash   Current Outpatient Medications on File Prior to Visit  Medication Sig Dispense Refill   ALPRAZolam (XANAX) 0.5 MG tablet 1/2 to 1 pill by mouth twice daily as needed for anxiety with travel, caution of sedation (Patient taking differently: Take 0.25-0.5 mg by mouth 2 (two) times daily as needed for anxiety. 1/2 to 1 pill by mouth twice daily as needed for anxiety with travel, caution of sedation) 10 tablet 0   amLODipine (NORVASC) 10 MG tablet TAKE 1 TABLET BY MOUTH  EVERY DAY 90 tablet 1   bimatoprost (LUMIGAN) 0.01 % SOLN Place 1 drop into both eyes at bedtime.     brimonidine (ALPHAGAN P) 0.1 % SOLN Place 1 drop into both eyes 2 (two) times daily.     Cal Carb-Mag Hydrox-Simeth (ROLAIDS ADVANCED PO) Take 1 tablet by mouth as needed (indigestion).     calcium carbonate (TUMS - DOSED IN MG ELEMENTAL CALCIUM) 500 MG chewable tablet Chew 1 tablet by mouth daily as needed for indigestion or heartburn.     cholecalciferol (VITAMIN D) 1000 UNITS tablet Take 2,000 Units by mouth daily.      hydrochlorothiazide (HYDRODIURIL) 25 MG tablet Take 1 tablet (25 mg total) by mouth daily. 90 tablet 3   metFORMIN (GLUCOPHAGE) 500 MG tablet Take 1 tablet (500 mg total) by mouth 2 (two) times daily with a meal. 180 tablet 3   metoprolol tartrate (LOPRESSOR) 100 MG tablet Take 1 tablet (100 mg total) by mouth 2 (two) times daily. 180 tablet 3   olmesartan (BENICAR) 40 MG tablet Take 1 tablet (40 mg total) by mouth daily. 90 tablet 3   potassium chloride SA (KLOR-CON) 20 MEQ tablet Take 1 tablet (20 mEq total) by mouth daily. 90 tablet 3   rosuvastatin (CRESTOR) 20 MG tablet TAKE 1 TABLET BY MOUTH  DAILY AT NOON 90 tablet 3   No current facility-administered medications on file prior to visit.    Review of Systems  Constitutional:  Negative for activity change, appetite change, fatigue, fever and unexpected weight change.  HENT:  Negative for congestion, ear pain, rhinorrhea, sinus pressure and sore throat.   Eyes:  Negative for pain, redness and visual disturbance.  Respiratory:  Negative for cough, shortness of breath and wheezing.   Cardiovascular:  Negative for chest pain and palpitations.  Gastrointestinal:  Negative for abdominal pain, blood in stool, constipation and diarrhea.  Endocrine: Negative for polydipsia and polyuria.  Genitourinary:  Negative for dysuria, frequency and urgency.  Musculoskeletal:  Negative for arthralgias, back pain and myalgias.       Occ R  hip pain  Skin:  Negative for pallor and rash.  Allergic/Immunologic: Negative for environmental allergies.  Neurological:  Negative for dizziness, syncope and headaches.  Hematological:  Negative for adenopathy. Does not bruise/bleed easily.  Psychiatric/Behavioral:  Negative for decreased concentration and dysphoric mood. The patient is not nervous/anxious.       Objective:   Physical Exam Constitutional:      General: She is not in acute distress.    Appearance: Normal appearance. She is normal weight. She is not  ill-appearing or diaphoretic.  Eyes:     General: No scleral icterus.    Conjunctiva/sclera: Conjunctivae normal.     Pupils: Pupils are equal, round, and reactive to light.  Cardiovascular:     Rate and Rhythm: Normal rate and regular rhythm.  Pulmonary:     Effort: Pulmonary effort is normal. No respiratory distress.  Abdominal:     General: Abdomen is flat.     Palpations: Abdomen is soft.     Tenderness: There is no abdominal tenderness.  Musculoskeletal:     Cervical back: Normal range of motion and neck supple. No tenderness.     Comments: No kyphosis  Excellent posture   No acute joint changes   Lymphadenopathy:     Cervical: No cervical adenopathy.  Neurological:     Mental Status: She is alert.     Motor: No weakness.     Gait: Gait normal.  Psychiatric:        Mood and Affect: Mood normal.          Assessment & Plan:   Problem List Items Addressed This Visit       Musculoskeletal and Integument   Osteoporosis - Primary    85 yo, post menopausal  Recent dexa 06/2021 with lowest T score in LFN of -3.0 Down 8.7% this time Took actonel years ago for 5 y -then had n/v (in retrospect she thinks it may not have been from the medication) No falls or fractures, takes vit D and walks for exercise Discussed option of return to bisphospenate or consideration of Prolia  Chose to try alendronate weekly (5 years if well tolerated)  Disc in detail the  correct way to take it and handout given  inst to hold for any significant dental proceedures If any side eff, inst to stop it and call (would investigate prolia)  Discussed fall prevention  Disc imp of exercise and vit D Will re check dexa in 2 y       Relevant Medications   alendronate (FOSAMAX) 70 MG tablet     Other   Vitamin D deficiency    Encouraged pt to be compliant with vit D3 2000 iu daily  Last level 39.9 -in tx range  Discussed imp to bone and overall health      Other Visit Diagnoses     Need for immunization against influenza       Relevant Orders   Flu Vaccine QUAD High Dose(Fluad) (Completed)

## 2021-07-11 NOTE — Assessment & Plan Note (Signed)
85 yo, post menopausal  Recent dexa 06/2021 with lowest T score in LFN of -3.0 Down 8.7% this time Took actonel years ago for 5 y -then had n/v (in retrospect she thinks it may not have been from the medication) No falls or fractures, takes vit D and walks for exercise Discussed option of return to bisphospenate or consideration of Prolia  Chose to try alendronate weekly (5 years if well tolerated)  Disc in detail the correct way to take it and handout given  inst to hold for any significant dental proceedures If any side eff, inst to stop it and call (would investigate prolia)  Discussed fall prevention  Disc imp of exercise and vit D Will re check dexa in 2 y

## 2021-07-11 NOTE — Assessment & Plan Note (Signed)
Encouraged pt to be compliant with vit D3 2000 iu daily  Last level 39.9 -in tx range  Discussed imp to bone and overall health

## 2021-07-13 ENCOUNTER — Telehealth: Payer: Self-pay | Admitting: Family Medicine

## 2021-07-13 NOTE — Telephone Encounter (Signed)
Pt daughter called saying Mrs Warmuth has a lot of pain from her arthritis .. wondering if Dr Glori Bickers can prescribe something or if there is anything over the counter.

## 2021-07-14 NOTE — Telephone Encounter (Signed)
Where is her pain?   We just started alendronate and a possible side effect is body aches/pain. Has pain started since taking that medication?

## 2021-07-14 NOTE — Telephone Encounter (Signed)
It was pt's daughter Wynema Birch no DPR signed. I advised her due to no DPR on file I can't talk at all about pt but recommended next time pt is here to have her update her DPR so I will be able to talk to her in the future. Daughter understood and asked that I call pt back directly.  Called pt and pain has been going on before starting fosamax. Pt said it's not all over it's just her right hip. She said she told PCP about the pain before and she told her it was probably arthritis. Pt said the pain comes and goes and it's usually worse when the weather changes or it's about to rain. Pt said it feels like it "locks up" on her and it's so painful she can hardly walk. Pt said that she does have ibuprofen at home but she is afraid to take any pain meds without checking with PCP so she hasn't taken anything for pain.  [facility] [place].

## 2021-07-14 NOTE — Telephone Encounter (Signed)
At her age it is safer to take tylenol.  1-2 pills (regular strength) up to every 6 hours as needed.   If pain worsens please f/u to discuss

## 2021-07-14 NOTE — Telephone Encounter (Signed)
Not sure what daughter called and there is no call back # listed on note.   Called pt directly and no answer so left VM requesting pt to call the office back

## 2021-07-15 NOTE — Telephone Encounter (Signed)
Pt notified of Dr. Marliss Coots comments and recommendations and verbalized understanding

## 2021-08-05 LAB — HM DIABETES EYE EXAM

## 2021-08-08 ENCOUNTER — Telehealth: Payer: Self-pay

## 2021-08-08 NOTE — Progress Notes (Addendum)
Chronic Care Management Pharmacy Assistant   Name: Kathy Cunningham  MRN: [id number] DOB: 1935/11/07  Reason for Encounter: CCM (Diabetes Disease State)   Recent office visits:  07/13/2021 - Kathy Pardon, MD - Telephone - Patient call for hip pain. Recommended: Tylenol.  1-2 pills (regular strength) up to every 6 hours as needed.   07/11/2021 - Kathy Pardon, MD - Patient presented for hip pain. Start: alendronate (FOSAMAX) 70 MG tablet.   Recent consult visits:  None since last CCM contact  Hospital visits:  None in previous 6 months  Medications: Outpatient Encounter Medications as of 08/08/2021  Medication Sig   alendronate (FOSAMAX) 70 MG tablet Take 1 tablet (70 mg total) by mouth every 7 (seven) days. Take with a full glass of water on an empty stomach.   ALPRAZolam (XANAX) 0.5 MG tablet 1/2 to 1 pill by mouth twice daily as needed for anxiety with travel, caution of sedation (Patient taking differently: Take 0.25-0.5 mg by mouth 2 (two) times daily as needed for anxiety. 1/2 to 1 pill by mouth twice daily as needed for anxiety with travel, caution of sedation)   amLODipine (NORVASC) 10 MG tablet TAKE 1 TABLET BY MOUTH EVERY DAY   bimatoprost (LUMIGAN) 0.01 % SOLN Place 1 drop into both eyes at bedtime.   brimonidine (ALPHAGAN P) 0.1 % SOLN Place 1 drop into both eyes 2 (two) times daily.   Cal Carb-Mag Hydrox-Simeth (ROLAIDS ADVANCED PO) Take 1 tablet by mouth as needed (indigestion).   calcium carbonate (TUMS - DOSED IN MG ELEMENTAL CALCIUM) 500 MG chewable tablet Chew 1 tablet by mouth daily as needed for indigestion or heartburn.   cholecalciferol (VITAMIN D) 1000 UNITS tablet Take 2,000 Units by mouth daily.    hydrochlorothiazide (HYDRODIURIL) 25 MG tablet Take 1 tablet (25 mg total) by mouth daily.   metFORMIN (GLUCOPHAGE) 500 MG tablet Take 1 tablet (500 mg total) by mouth 2 (two) times daily with a meal.   metoprolol tartrate (LOPRESSOR) 100 MG tablet Take 1 tablet (100 mg  total) by mouth 2 (two) times daily.   olmesartan (BENICAR) 40 MG tablet Take 1 tablet (40 mg total) by mouth daily.   potassium chloride SA (KLOR-CON) 20 MEQ tablet Take 1 tablet (20 mEq total) by mouth daily.   rosuvastatin (CRESTOR) 20 MG tablet TAKE 1 TABLET BY MOUTH  DAILY AT NOON   No facility-administered encounter medications on file as of 08/08/2021.   Recent Relevant Labs: Lab Results  Component Value Date/Time   HGBA1C 6.1 03/29/2021 12:12 PM   HGBA1C 6.4 10/17/2019 08:57 AM   MICROALBUR 4.8 (H) 10/04/2009 09:15 AM    Kidney Function Lab Results  Component Value Date/Time   CREATININE 0.98 03/29/2021 12:12 PM   CREATININE 1.09 (H) 02/01/2021 02:22 AM   GFR 53.42 (L) 03/29/2021 12:12 PM   GFRNONAA 50 (L) 02/01/2021 02:22 AM   GFRAA 48 (L) 06/06/2020 03:08 PM   Contacted patient on 08/15/2021 to discuss diabetes disease state.   Current antihyperglycemic regimen:  Metformin 500mg  - 1 tablet twice daily with meals.    Patient verbally confirms she is taking the above medications as directed. Yes  What diet changes have been made to improve diabetes control?  Patient states she isn't doing anything to help her diabetes through diet. States she had a Coke on Saturday and it made her sugar spike. I counseled patient on the importance of not drinking soda.   What recent interventions/DTPs have been made  to improve glycemic control:  No recent interventions  Have there been any recent hospitalizations or ED visits since last visit with CPP? No  Patient denies hypoglycemic symptoms, including Pale, Sweaty, Shaky, Hungry, Nervous/irritable, and Vision changes  Patient denies hyperglycemic symptoms, including blurry vision, excessive thirst, fatigue, polyuria, and weakness  How often are you checking your blood sugar? Patient states he daughter will check her blood sugar when she comes over.  Date  Blood Sugar Readings 12/12  151 12/11  143  During the week, how often does  your blood glucose drop below 70? Never  Are you checking your feet daily/regularly? Yes    Adherence Review: Is the patient currently on a STATIN medication? Yes Is the patient currently on ACE/ARB medication? Yes Does the patient have >5 day gap between last estimated fill dates? No   Care Gaps: Annual wellness visit in last year? Yes 03/29/2021 Most Recent BP reading: 130/66 on 07/11/2021   If Diabetic: Most recent A1C reading: 6.1 on 03/29/2021 Last eye exam / retinopathy screening: Up to date Last diabetic foot exam: 10/22/2019  Star Rating Drugs:  Medication:  Last Fill: Day Supply Olmesartan 40mg        06/22/2021      90 Rosuvastatin 20mg      06/01/2021      90          Metformin 500mg         06/22/2021      90  No appointments scheduled within the next 30 days.  Kathy Cunningham, CPP notified  Kathy Cunningham, [place] Clinical Pharmacy Assistant [phone]  I have reviewed the care management and care coordination activities outlined in this encounter and I am certifying that I agree with the content of this note. No further action required.  Kathy Cunningham, PharmD Clinical Pharmacist [facility] [phone]

## 2021-08-26 DIAGNOSIS — H2512 Age-related nuclear cataract, left eye: Secondary | ICD-10-CM | POA: Diagnosis not present

## 2021-08-26 DIAGNOSIS — H401132 Primary open-angle glaucoma, bilateral, moderate stage: Secondary | ICD-10-CM | POA: Diagnosis not present

## 2021-08-26 LAB — HM DIABETES EYE EXAM

## 2021-09-19 ENCOUNTER — Other Ambulatory Visit (HOSPITAL_BASED_OUTPATIENT_CLINIC_OR_DEPARTMENT_OTHER): Payer: Self-pay

## 2021-09-19 ENCOUNTER — Ambulatory Visit: Payer: Medicare Other | Attending: Internal Medicine

## 2021-09-19 ENCOUNTER — Other Ambulatory Visit: Payer: Self-pay

## 2021-09-19 DIAGNOSIS — Z23 Encounter for immunization: Secondary | ICD-10-CM

## 2021-09-19 MED ORDER — PFIZER COVID-19 VAC BIVALENT 30 MCG/0.3ML IM SUSP
INTRAMUSCULAR | 0 refills | Status: DC
  Filled 2021-09-19: qty 0.3, 1d supply, fill #0

## 2021-09-19 NOTE — Progress Notes (Signed)
° °  Covid-19 Vaccination Clinic  Name:  Kathy Cunningham    MRN: [id number] DOB: Apr 25, 1936  09/19/2021  [name] was observed post Covid-19 immunization for 15 minutes without incident. She was provided with Vaccine Information Sheet and instruction to access the V-Safe system.   [name] was instructed to call 911 with any severe reactions post vaccine: Difficulty breathing  Swelling of face and throat  A fast heartbeat  A bad rash all over body  Dizziness and weakness   Immunizations Administered     Name Date Dose VIS Date Route   Pfizer Covid-19 Vaccine Bivalent Booster 09/19/2021 10:10 AM 0.3 mL 05/04/2021 Intramuscular   Manufacturer: [place]   Lot: [id number]   [place]: [phone]

## 2021-09-23 ENCOUNTER — Other Ambulatory Visit: Payer: Self-pay | Admitting: Family Medicine

## 2021-10-03 ENCOUNTER — Encounter: Payer: Self-pay | Admitting: Family Medicine

## 2021-10-03 ENCOUNTER — Other Ambulatory Visit: Payer: Self-pay

## 2021-10-03 ENCOUNTER — Ambulatory Visit (INDEPENDENT_AMBULATORY_CARE_PROVIDER_SITE_OTHER): Payer: Medicare Other | Admitting: Family Medicine

## 2021-10-03 ENCOUNTER — Encounter: Payer: Self-pay | Admitting: *Deleted

## 2021-10-03 VITALS — BP 133/70 | HR 80 | Temp 97.2°F | Ht 60.0 in | Wt 129.4 lb

## 2021-10-03 DIAGNOSIS — E785 Hyperlipidemia, unspecified: Secondary | ICD-10-CM | POA: Diagnosis not present

## 2021-10-03 DIAGNOSIS — I10 Essential (primary) hypertension: Secondary | ICD-10-CM

## 2021-10-03 DIAGNOSIS — E119 Type 2 diabetes mellitus without complications: Secondary | ICD-10-CM | POA: Diagnosis not present

## 2021-10-03 DIAGNOSIS — E1169 Type 2 diabetes mellitus with other specified complication: Secondary | ICD-10-CM

## 2021-10-03 LAB — LIPID PANEL
Cholesterol: 160 mg/dL (ref 0–200)
HDL: 73.4 mg/dL (ref >39.00)
LDL Cholesterol: 68 mg/dL (ref 0–99)
NonHDL: 86.91
Total CHOL/HDL Ratio: 2
Triglycerides: 93 mg/dL (ref 0.0–149.0)
VLDL: 18.6 mg/dL (ref 0.0–40.0)

## 2021-10-03 LAB — COMPREHENSIVE METABOLIC PANEL
ALT: 9 U/L (ref 0–35)
AST: 19 U/L (ref 0–37)
Albumin: 4.4 g/dL (ref 3.5–5.2)
Alkaline Phosphatase: 59 U/L (ref 39–117)
BUN: 18 mg/dL (ref 6–23)
CO2: 29 mEq/L (ref 19–32)
Calcium: 9.7 mg/dL (ref 8.4–10.5)
Chloride: 103 mEq/L (ref 96–112)
Creatinine, Ser: 0.95 mg/dL (ref 0.40–1.20)
GFR: 55.25 mL/min — ABNORMAL LOW (ref >60.00)
Glucose, Bld: 131 mg/dL — ABNORMAL HIGH (ref 70–99)
Potassium: 3.7 mEq/L (ref 3.5–5.1)
Sodium: 141 mEq/L (ref 135–145)
Total Bilirubin: 0.7 mg/dL (ref 0.2–1.2)
Total Protein: 7.4 g/dL (ref 6.0–8.3)

## 2021-10-03 LAB — HEMOGLOBIN A1C: Hgb A1c MFr Bld: 6.3 % (ref 4.6–6.5)

## 2021-10-03 NOTE — Assessment & Plan Note (Signed)
bp in fair control at this time  BP Readings from Last 1 Encounters:  10/03/21 133/70   No changes needed Most recent labs reviewed  Disc lifstyle change with low sodium diet and exercise  Plan to continue Amlodipine 10 mg daily  Hctz 25 mg daily  Metoprolol 100 mg bid  Olmesartan 40 mg daily

## 2021-10-03 NOTE — Assessment & Plan Note (Signed)
a1c ordered  Suspect stable/well controlled  Taking metformin 500 mg bid No symptoms of low glucose  Taking arb  Taking statin  Nl foot exam /good foot care Sent for her eye exam from last month Good diet

## 2021-10-03 NOTE — Assessment & Plan Note (Signed)
Disc goals for lipids and reasons to control them Rev last labs with pt Rev low sat fat diet in detail Labs done Expect improvement with return to her crestor 20 mg daily

## 2021-10-03 NOTE — Progress Notes (Signed)
Subjective:    Patient ID: Kathy Cunningham, female    DOB: 12/10/1935, 86 y.o.   MRN: [id number]  This visit occurred during the SARS-CoV-2 public health emergency.  Safety protocols were in place, including screening questions prior to the visit, additional usage of staff PPE, and extensive cleaning of exam room while observing appropriate contact time as indicated for disinfecting solutions.   HPI Pt presents for f/u of chronic health problems including HTN and DM and lipids  Wt Readings from Last 3 Encounters:  10/03/21 129 lb 6 oz (58.7 kg)  07/11/21 128 lb (58.1 kg)  03/29/21 128 lb 6 oz (58.2 kg)   25.27 kg/m  Doing ok  Feels ok  Still active/walking in [employer] /parking lot  Is afraid to travel/dislikes traffic   HTN bp is stable today  No cp or palpitations or headaches or edema  No side effects to medicines  BP Readings from Last 3 Encounters:  10/03/21 133/70  07/11/21 130/66  03/29/21 138/62    Amlodipine 10 mg daily  Hctz 25 mg daily  Metoprolol 100 mg bid  Olmesartan 40 mg daily   DM2 Lab Results  Component Value Date   HGBA1C 6.1 03/29/2021   Due for labs  Takes metformin 500 mg bid  Appetite is very good  She does avoid excessive sugar  No lows that she knows of   Eye exam 12/21 in chart (went in dec)  Now going to get cataracts done  Goes twice yearly  Due for  Good foot care   Hyperlipidemia Lab Results  Component Value Date   CHOL 215 (H) 03/29/2021   HDL 78.20 03/29/2021   LDLCALC 119 (H) 03/29/2021   TRIG 90.0 03/29/2021   CHOLHDL 3 03/29/2021  Last visit she had stopped crestor for a while but then got back on it  Taking crestor 20 mg daily  No side effects or problems   No red meat  Eats french fries -small order every day from [employer]   Patient Active Problem List   Diagnosis Date Noted   Dehydration 06/07/2020   Hearing loss 10/22/2019   Tinnitus aurium, right 09/26/2019   Medicare annual wellness visit, subsequent  04/21/2019   Initial Medicare annual wellness visit 06/15/2015   Routine general medical examination at a health care facility 06/15/2015   Estrogen deficiency 06/15/2015   Breast cancer screening 12/17/2013   Other screening mammogram 04/24/2011   Glaucoma 04/24/2011   Vitamin D deficiency 04/24/2011   Anxiety 10/20/2010   HYPOKALEMIA 10/22/2009   Other thalassemia ([place])    Osteoporosis 05/01/2008   Diabetes type 2, controlled ([place]) 07/17/2007   Hyperlipidemia associated with type 2 diabetes mellitus ([place]) 07/17/2007   Anemia 07/17/2007   Essential hypertension 07/17/2007   PREMATURE VENTRICULAR CONTRACTIONS, FREQUENT 07/17/2007   UTI'S, RECURRENT 07/17/2007   Past Medical History:  Diagnosis Date   Anemia    Diabetes mellitus    type II   Dizziness    or vertigo   Glaucoma    Hyperlipidemia    Hypertension    Past Surgical History:  Procedure Laterality Date   bladder papilloma  2006   ROTATOR CUFF REPAIR  01/2002   vaginal hysterectomy     Social History   Tobacco Use   Smoking status: Never   Smokeless tobacco: Never  Vaping Use   Vaping Use: Never used  Substance Use Topics   Alcohol use: No    Alcohol/week: 0.0 standard drinks  Drug use: No   Family History  Problem Relation Age of Onset   Heart disease Mother        MI   Heart attack Mother    Hypertension Mother    Heart disease Father        MI   Heart attack Father    Hypertension Father    Cancer Brother        pancreatic    Allergies  Allergen Reactions   Atorvastatin Other (See Comments)    REACTION: cramps   Sulfonamide Derivatives Rash   Current Outpatient Medications on File Prior to Visit  Medication Sig Dispense Refill   alendronate (FOSAMAX) 70 MG tablet Take 1 tablet (70 mg total) by mouth every 7 (seven) days. Take with a full glass of water on an empty stomach. 12 tablet 3   ALPRAZolam (XANAX) 0.5 MG tablet 1/2 to 1 pill by mouth twice daily as needed for anxiety with  travel, caution of sedation (Patient taking differently: Take 0.25-0.5 mg by mouth 2 (two) times daily as needed for anxiety. 1/2 to 1 pill by mouth twice daily as needed for anxiety with travel, caution of sedation) 10 tablet 0   amLODipine (NORVASC) 10 MG tablet TAKE 1 TABLET BY MOUTH  DAILY 90 tablet 1   bimatoprost (LUMIGAN) 0.01 % SOLN Place 1 drop into both eyes at bedtime.     brimonidine (ALPHAGAN P) 0.1 % SOLN Place 1 drop into both eyes 2 (two) times daily.     Cal Carb-Mag Hydrox-Simeth (ROLAIDS ADVANCED PO) Take 1 tablet by mouth as needed (indigestion).     calcium carbonate (TUMS - DOSED IN MG ELEMENTAL CALCIUM) 500 MG chewable tablet Chew 1 tablet by mouth daily as needed for indigestion or heartburn.     cholecalciferol (VITAMIN D) 1000 UNITS tablet Take 2,000 Units by mouth daily.      hydrochlorothiazide (HYDRODIURIL) 25 MG tablet Take 1 tablet (25 mg total) by mouth daily. 90 tablet 3   metFORMIN (GLUCOPHAGE) 500 MG tablet Take 1 tablet (500 mg total) by mouth 2 (two) times daily with a meal. 180 tablet 3   metoprolol tartrate (LOPRESSOR) 100 MG tablet Take 1 tablet (100 mg total) by mouth 2 (two) times daily. 180 tablet 3   olmesartan (BENICAR) 40 MG tablet Take 1 tablet (40 mg total) by mouth daily. 90 tablet 3   potassium chloride SA (KLOR-CON) 20 MEQ tablet Take 1 tablet (20 mEq total) by mouth daily. 90 tablet 3   rosuvastatin (CRESTOR) 20 MG tablet TAKE 1 TABLET BY MOUTH  DAILY AT NOON 90 tablet 3   No current facility-administered medications on file prior to visit.     Review of Systems  Constitutional:  Negative for activity change, appetite change, fatigue, fever and unexpected weight change.  HENT:  Negative for congestion, ear pain, rhinorrhea, sinus pressure and sore throat.   Eyes:  Negative for pain, redness and visual disturbance.  Respiratory:  Negative for cough, shortness of breath and wheezing.   Cardiovascular:  Negative for chest pain and palpitations.   Gastrointestinal:  Negative for abdominal pain, blood in stool, constipation and diarrhea.  Endocrine: Negative for polydipsia and polyuria.  Genitourinary:  Negative for dysuria, frequency and urgency.  Musculoskeletal:  Positive for arthralgias. Negative for back pain and myalgias.  Skin:  Negative for pallor and rash.  Allergic/Immunologic: Negative for environmental allergies.  Neurological:  Negative for dizziness, syncope and headaches.  Hematological:  Negative for adenopathy. Does  not bruise/bleed easily.  Psychiatric/Behavioral:  Negative for decreased concentration and dysphoric mood. The patient is nervous/anxious.       Objective:   Physical Exam Constitutional:      General: She is not in acute distress.    Appearance: Normal appearance. She is well-developed and normal weight. She is not ill-appearing or diaphoretic.  HENT:     Head: Normocephalic and atraumatic.     Mouth/Throat:     Mouth: Mucous membranes are moist.  Eyes:     General: No scleral icterus.    Conjunctiva/sclera: Conjunctivae normal.     Pupils: Pupils are equal, round, and reactive to light.  Neck:     Thyroid: No thyromegaly.     Vascular: No carotid bruit or JVD.  Cardiovascular:     Rate and Rhythm: Normal rate and regular rhythm.     Pulses: Normal pulses.     Heart sounds: Normal heart sounds.    No gallop.  Pulmonary:     Effort: Pulmonary effort is normal. No respiratory distress.     Breath sounds: Normal breath sounds. No wheezing or rales.  Abdominal:     General: Bowel sounds are normal. There is no distension or abdominal bruit.     Palpations: Abdomen is soft. There is no mass.     Tenderness: There is no abdominal tenderness.  Musculoskeletal:     Cervical back: Normal range of motion and neck supple.     Right lower leg: No edema.     Left lower leg: No edema.  Lymphadenopathy:     Cervical: No cervical adenopathy.  Skin:    General: Skin is warm and dry.     Coloration:  Skin is not jaundiced or pale.     Findings: No rash.  Neurological:     Mental Status: She is alert.     Coordination: Coordination normal.     Deep Tendon Reflexes: Reflexes are normal and symmetric. Reflexes normal.  Psychiatric:        Mood and Affect: Mood normal.        Cognition and Memory: Cognition and memory normal.          Assessment & Plan:   Problem List Items Addressed This Visit       Cardiovascular and Mediastinum   Essential hypertension    bp in fair control at this time  BP Readings from Last 1 Encounters:  10/03/21 133/70  No changes needed Most recent labs reviewed  Disc lifstyle change with low sodium diet and exercise  Plan to continue Amlodipine 10 mg daily  Hctz 25 mg daily  Metoprolol 100 mg bid  Olmesartan 40 mg daily       Relevant Orders   Comprehensive metabolic panel   Lipid panel     Endocrine   Diabetes type 2, controlled ([place]) - Primary    a1c ordered  Suspect stable/well controlled  Taking metformin 500 mg bid No symptoms of low glucose  Taking arb  Taking statin  Nl foot exam /good foot care Sent for her eye exam from last month Good diet       Relevant Orders   Hemoglobin A1c   Hyperlipidemia associated with type 2 diabetes mellitus ([place])    Disc goals for lipids and reasons to control them Rev last labs with pt Rev low sat fat diet in detail Labs done Expect improvement with return to her crestor 20 mg daily        Relevant  Orders   Lipid panel

## 2021-10-03 NOTE — Patient Instructions (Addendum)
We will send for last eye doctor report   Labs today  No change in medicines   Try to get most of your carbohydrates from produce (with the exception of white potatoes)  Eat less bread/pasta/rice/snack foods/cereals/sweets and other items from the middle of the grocery store (processed carbs) For cholesterol Avoid red meat/ fried foods/ egg yolks/ fatty breakfast meats/ butter, cheese and high fat dairy/ and shellfish

## 2021-10-06 ENCOUNTER — Encounter: Payer: Self-pay | Admitting: Family Medicine

## 2021-11-23 ENCOUNTER — Telehealth: Payer: Self-pay

## 2021-11-23 NOTE — Progress Notes (Signed)
? ? ?  Chronic Care Management ?Pharmacy Assistant  ? ?Name: Kathy Cunningham  MRN: [id number] DOB: 1936-01-28 ? ?Reason for Encounter: CCM Counsellor) ?  ?Medications: ?Outpatient Encounter Medications as of 11/23/2021  ?Medication Sig  ? alendronate (FOSAMAX) 70 MG tablet Take 1 tablet (70 mg total) by mouth every 7 (seven) days. Take with a full glass of water on an empty stomach.  ? ALPRAZolam (XANAX) 0.5 MG tablet 1/2 to 1 pill by mouth twice daily as needed for anxiety with travel, caution of sedation (Patient taking differently: Take 0.25-0.5 mg by mouth 2 (two) times daily as needed for anxiety. 1/2 to 1 pill by mouth twice daily as needed for anxiety with travel, caution of sedation)  ? amLODipine (NORVASC) 10 MG tablet TAKE 1 TABLET BY MOUTH  DAILY  ? bimatoprost (LUMIGAN) 0.01 % SOLN Place 1 drop into both eyes at bedtime.  ? brimonidine (ALPHAGAN P) 0.1 % SOLN Place 1 drop into both eyes 2 (two) times daily.  ? Cal Carb-Mag Hydrox-Simeth (ROLAIDS ADVANCED PO) Take 1 tablet by mouth as needed (indigestion).  ? calcium carbonate (TUMS - DOSED IN MG ELEMENTAL CALCIUM) 500 MG chewable tablet Chew 1 tablet by mouth daily as needed for indigestion or heartburn.  ? cholecalciferol (VITAMIN D) 1000 UNITS tablet Take 2,000 Units by mouth daily.   ? hydrochlorothiazide (HYDRODIURIL) 25 MG tablet Take 1 tablet (25 mg total) by mouth daily.  ? metFORMIN (GLUCOPHAGE) 500 MG tablet Take 1 tablet (500 mg total) by mouth 2 (two) times daily with a meal.  ? metoprolol tartrate (LOPRESSOR) 100 MG tablet Take 1 tablet (100 mg total) by mouth 2 (two) times daily.  ? olmesartan (BENICAR) 40 MG tablet Take 1 tablet (40 mg total) by mouth daily.  ? potassium chloride SA (KLOR-CON) 20 MEQ tablet Take 1 tablet (20 mEq total) by mouth daily.  ? rosuvastatin (CRESTOR) 20 MG tablet TAKE 1 TABLET BY MOUTH  DAILY AT NOON  ? ?No facility-administered encounter medications on file as of 11/23/2021.  ? ?TARRI GUILFOIL was contacted  to remind of upcoming telephone visit with Charlene Brooke on 11/28/2021 at 9:30. Patient was reminded to have any blood glucose and blood pressure readings available for review at appointment.  ? ?Patient confirmed appointment. ? ?Are you having any problems with your medications? No  ? ?Do you have any concerns you like to discuss with the pharmacist? No ? ?Star Rating Drugs: ?Medication:  Last Fill: Day Supply ?Metfromin 500 mg 09/15/2021 90 ?Olmesartan 40 mg 09/15/2021 90  ?Rosuvastatin 20 mg 08/14/2021 90 ? ?Charlene Brooke, CPP notified ? ?[name], RMA ?Clinical Pharmacy Assistant ?[phone] ? ?  ? ? ? ?

## 2021-11-28 ENCOUNTER — Other Ambulatory Visit: Payer: Self-pay

## 2021-11-28 ENCOUNTER — Ambulatory Visit (INDEPENDENT_AMBULATORY_CARE_PROVIDER_SITE_OTHER): Payer: Medicare Other | Admitting: Pharmacist

## 2021-11-28 DIAGNOSIS — M810 Age-related osteoporosis without current pathological fracture: Secondary | ICD-10-CM

## 2021-11-28 DIAGNOSIS — I10 Essential (primary) hypertension: Secondary | ICD-10-CM

## 2021-11-28 DIAGNOSIS — E119 Type 2 diabetes mellitus without complications: Secondary | ICD-10-CM

## 2021-11-28 DIAGNOSIS — E1169 Type 2 diabetes mellitus with other specified complication: Secondary | ICD-10-CM

## 2021-11-28 NOTE — Patient Instructions (Signed)
Visit Information ? ?Phone number for Pharmacist: [phone] ? ? Goals Addressed   ?None ?  ? ? ?Care Plan : Lead  ?Updates made by Charlton Haws, [place] since 11/28/2021 12:00 AM  ?  ? ?Problem: Hypertension, Hyperlipidemia, Diabetes, and Osteoporosis   ?Priority: High  ?  ? ?Long-Range Goal: Disease Management   ?Start Date: 05/10/2021  ?Expected End Date: 11/29/2022  ?This Visit's Progress: On track  ?Priority: High  ?Note:   ?Current Barriers:  ?None identified ? ?Pharmacist Clinical Goal(s):  ?Patient will achieve ability to self administer medications as prescribed through use of pill box as evidenced by patient report through collaboration with PharmD and provider.  ? ?Interventions: ?1:1 collaboration with Tower, Wynelle Fanny, MD regarding development and update of comprehensive plan of care as evidenced by provider attestation and co-signature ?Inter-disciplinary care team collaboration (see longitudinal plan of care) ?Comprehensive medication review performed; medication list updated in electronic medical record ? ?Hypertension (BP goal <140/90) ?-Controlled - per clinic and home readings ?-Current home BP readings: 133/71, 140/70, 133/81, 148/62, 142/74, 138/87 ?-Current treatment: ?Amlodipine 10 mg daily AM - Appropriate, Effective, Safe, Accessible ?HCTZ 25 mg daily AM -Appropriate, Effective, Safe, Accessible ?Potassium 20 mEq  daily AM -Appropriate, Effective, Safe, Accessible ?Metoprolol tartrate 100 mg BID -Appropriate, Effective, Safe, Accessible ?Olmesartan 40 mg daily PM -Appropriate, Effective, Safe, Accessible ?-Medications previously tried: none  ?-Educated on BP goals and benefits of medications for prevention of heart attack, stroke and kidney damage; Importance of home blood pressure monitoring; Symptoms of hypotension and importance of maintaining adequate hydration; ?-Counseled to monitor BP at home 1-2x weekly  ?-Recommended to continue current  medication ? ?Hyperlipidemia: (LDL goal < 100) ?-Controlled - LDL 68 (09/2021), adherence affirmed  ?-Current treatment: ?Rosuvastatin 20 mg daily PM -Appropriate, Effective, Safe, Accessible ?-Medications previously tried: none  ?-Educated on: importance of daily adherence ?-Recommended to continue current medication ? ?Diabetes (A1c goal <7%) ?-Controlled - A1c 6.3% (09/2021) ?-Current medications: ?Metformin 500 mg BID -Appropriate, Effective, Safe, Accessible ?-Medications previously tried: none  ?-Educated on Prevention and management of hypoglycemic episodes; ?-Recommended to continue current medication ? ?Osteoporosis (Goal: prevent fractures) ?-Controlled - planning 5 years of Fosamax ?-Last DEXA Scan: 06/22/21  ? T-Score femoral neck: -3.0 ? T-Score lumbar spine: -1.2 ?-Patient is a candidate for pharmacologic treatment due to T-Score < -2.5 in femoral neck ?-Current treatment  ?Alendronate 70  mg weekly (started 07/2021) - Appropriate, Effective, Safe, Accessible ?-Medications previously tried: risedronate (x 5 years)  ?-Recommend weight-bearing and muscle strengthening exercises for building and maintaining bone density. ?-Recommended to continue current medication ? ?Patient Goals/Self-Care Activities ?Patient will:  ?- take medications as prescribed as evidenced by patient report and record review ?focus on medication adherence by pill box ?check blood pressure 1-2x weekly, document, and provide at future appointments ? ?  ?  ? ?The patient verbalized understanding of instructions, educational materials, and care plan provided today and declined offer to receive copy of patient instructions, educational materials, and care plan.  ?Telephone follow up appointment with pharmacy team member scheduled for: 1 year ? ?Charlene Brooke, PharmD, BCACP ?Clinical Pharmacist ?[facility] ?[phone] ?  ?

## 2021-11-28 NOTE — Progress Notes (Signed)
? ?Chronic Care Management ?Pharmacy Note ? ?11/28/2021 ?Name:  Kathy Cunningham MRN:  [id number] DOB:  08/16/1936 ? ?Summary: CCM F/U visit ?-Pt endorses compliance with medications; she is using pill organizer based on medication instructions provided by PharmD at initial visit ?  ?Recommendations/Changes made from today's visit: ?-No med changes ?  ?Plan: ?-[place] will call patient 3 months for BP update ?-Pharmacist follow up televisit scheduled for 1 year ? ? ? ?Subjective: ?Kathy Cunningham is an 86 y.o. year old female who is a primary patient of Tower, Wynelle Fanny, MD.  The CCM team was consulted for assistance with disease management and care coordination needs.   ? ?Engaged with patient by telephone for follow up visit in response to provider referral for pharmacy case management and/or care coordination services.  ? ?Consent to Services:  ?The patient was given information about Chronic Care Management services, agreed to services, and gave verbal consent prior to initiation of services.  Please see initial visit note for detailed documentation.  ? ?Patient Care Team: ?Tower, Wynelle Fanny, MD as PCP - General ?Birder Robson, MD as Referring Physician (Ophthalmology) ?Hy Swiatek, Cleaster Corin, [facility] as Pharmacist (Pharmacist) ? ?Recent office visits: ?10/03/21 Dr Glori Bickers OV: chronic f/u; labs stable; no changes. ? ?07/11/21 Dr Glori Bickers OV: f/u osteoporosis; start alendronate 70 mg weekly. ? ?Recent consult visits: ?08/26/21 Dr George Ina (ophthalmology): f/u cataract ? ?Hospital visits: ?None in previous 6 months ? ? ?Objective: ? ?Lab Results  ?Component Value Date  ? CREATININE 0.95 10/03/2021  ? BUN 18 10/03/2021  ? GFR 55.25 (L) 10/03/2021  ? GFRNONAA 50 (L) 02/01/2021  ? GFRAA 48 (L) 06/06/2020  ? NA 141 10/03/2021  ? K 3.7 10/03/2021  ? CALCIUM 9.7 10/03/2021  ? CO2 29 10/03/2021  ? GLUCOSE 131 (H) 10/03/2021  ? ? ?Lab Results  ?Component Value Date/Time  ? HGBA1C 6.3 10/03/2021 09:56 AM  ? HGBA1C 6.1  03/29/2021 12:12 PM  ? GFR 55.25 (L) 10/03/2021 09:56 AM  ? GFR 53.42 (L) 03/29/2021 12:12 PM  ? MICROALBUR 4.8 (H) 10/04/2009 09:15 AM  ?  ?Last diabetic Eye exam:  ?Lab Results  ?Component Value Date/Time  ? HMDIABEYEEXA No Retinopathy 08/26/2021 12:00 AM  ?  ?Last diabetic Foot exam:  ?Lab Results  ?Component Value Date/Time  ? HMDIABFOOTEX yes 10/20/2010 12:00 AM  ?  ? ?Lab Results  ?Component Value Date  ? CHOL 160 10/03/2021  ? HDL 73.40 10/03/2021  ? Allen 68 10/03/2021  ? TRIG 93.0 10/03/2021  ? CHOLHDL 2 10/03/2021  ? ? ? ?  Latest Ref Rng & Units 10/03/2021  ?  9:56 AM 03/29/2021  ? 12:12 PM 06/06/2020  ?  3:08 PM  ?Hepatic Function  ?Total Protein 6.0 - 8.3 g/dL 7.4   7.2   7.6    ?Albumin 3.5 - 5.2 g/dL 4.4   4.4   4.3    ?AST 0 - 37 U/L '19   16   28    ' ?ALT 0 - 35 U/L '9   8   11    ' ?Alk Phosphatase 39 - 117 U/L 59   64   47    ?Total Bilirubin 0.2 - 1.2 mg/dL 0.7   0.6   0.9    ? ? ?Lab Results  ?Component Value Date/Time  ? TSH 1.09 03/29/2021 12:12 PM  ? TSH 1.38 04/15/2019 09:09 AM  ? ? ? ?  Latest Ref Rng & Units 03/29/2021  ? 12:12  PM 02/01/2021  ?  2:22 AM 06/06/2020  ?  3:08 PM  ?CBC  ?WBC 4.0 - 10.5 K/uL 4.6   7.5   2.8    ?Hemoglobin 12.0 - 15.0 g/dL 11.5   11.1   11.3    ?Hematocrit 36.0 - 46.0 % 35.3   34.5   33.9    ?Platelets 150.0 - 400.0 K/uL 286.0   282   218    ? ? ?Lab Results  ?Component Value Date/Time  ? VD25OH 39.91 03/29/2021 12:12 PM  ? VD25OH 43.24 04/15/2019 09:09 AM  ? ? ?Clinical ASCVD: No  ?The ASCVD Risk score (Arnett DK, et al., 2019) failed to calculate for the following reasons: ?  The 2019 ASCVD risk score is only valid for ages 1 to 56   ? ? ?  03/29/2021  ? 11:35 AM 04/21/2019  ? 11:29 AM 04/09/2018  ? 11:12 AM  ?Depression screen PHQ 2/9  ?Decreased Interest 0 0 2  ?Down, Depressed, Hopeless 0 0 0  ?PHQ - 2 Score 0 0 2  ?Altered sleeping   0  ?Tired, decreased energy   0  ?Change in appetite   0  ?Feeling bad or failure about yourself    0  ?Trouble concentrating   1   ?Moving slowly or fidgety/restless   0  ?Suicidal thoughts   0  ?PHQ-9 Score   3  ?Difficult doing work/chores   Not difficult at all  ?  ? ? ?Social History  ? ?Tobacco Use  ?Smoking Status Never  ?Smokeless Tobacco Never  ? ?BP Readings from Last 3 Encounters:  ?10/03/21 133/70  ?07/11/21 130/66  ?03/29/21 138/62  ? ?Pulse Readings from Last 3 Encounters:  ?10/03/21 80  ?07/11/21 81  ?03/29/21 71  ? ?Wt Readings from Last 3 Encounters:  ?10/03/21 129 lb 6 oz (58.7 kg)  ?07/11/21 128 lb (58.1 kg)  ?03/29/21 128 lb 6 oz (58.2 kg)  ? ?BMI Readings from Last 3 Encounters:  ?10/03/21 25.27 kg/m?  ?07/11/21 25.00 kg/m?  ?03/29/21 25.07 kg/m?  ? ? ?Assessment/Interventions: Review of patient past medical history, allergies, medications, health status, including review of consultants reports, laboratory and other test data, was performed as part of comprehensive evaluation and provision of chronic care management services.  ? ?SDOH:  (Social Determinants of Health) assessments and interventions performed: Yes ?SDOH Interventions   ? ?Flowsheet Row Most Recent Value  ?SDOH Interventions   ?Food Insecurity Interventions Intervention Not Indicated  ?Housing Interventions Intervention Not Indicated  ? ?  ? ?SDOH Screenings  ? ?Alcohol Screen: Not on file  ?Depression (PHQ2-9): Low Risk   ? PHQ-2 Score: 0  ?Financial Resource Strain: Low Risk   ? Difficulty of Paying Living Expenses: Not very hard  ?Food Insecurity: No Food Insecurity  ? Worried About Charity fundraiser in the Last Year: Never true  ? Ran Out of Food in the Last Year: Never true  ?Housing: Low Risk   ? Last Housing Risk Score: 0  ?Physical Activity: Not on file  ?Social Connections: Not on file  ?Stress: Not on file  ?Tobacco Use: Low Risk   ? Smoking Tobacco Use: Never  ? Smokeless Tobacco Use: Never  ? Passive Exposure: Not on file  ?Transportation Needs: Not on file  ? ? ?[place] ? ?Allergies  ?Allergen Reactions  ? Atorvastatin Other (See Comments)   ?  REACTION: cramps  ? Sulfonamide Derivatives Rash  ? ? ?Medications Reviewed Today   ? ?  Reviewed by Abner Greenspan, MD (Physician) on 10/03/21 at [phone]  Med List Status: <None>  ? ?Medication Order Taking? Sig Documenting Provider Last Dose Status Informant  ?alendronate (FOSAMAX) 70 MG tablet [id number] Yes Take 1 tablet (70 mg total) by mouth every 7 (seven) days. Take with a full glass of water on an empty stomach. Tower, Wynelle Fanny, MD Taking Active   ?ALPRAZolam (XANAX) 0.5 MG tablet [id number] Yes 1/2 to 1 pill by mouth twice daily as needed for anxiety with travel, caution of sedation  ?Patient taking differently: Take 0.25-0.5 mg by mouth 2 (two) times daily as needed for anxiety. 1/2 to 1 pill by mouth twice daily as needed for anxiety with travel, caution of sedation  ? Tower, Wynelle Fanny, MD Taking Active Self  ?amLODipine (NORVASC) 10 MG tablet [id number] Yes TAKE 1 TABLET BY MOUTH  DAILY Tower, Wynelle Fanny, MD Taking Active   ?bimatoprost (LUMIGAN) 0.01 % SOLN [id number] Yes Place 1 drop into both eyes at bedtime. [provider] Taking Active Self  ?brimonidine (ALPHAGAN P) 0.1 % SOLN [id number] Yes Place 1 drop into both eyes 2 (two) times daily. [provider] Taking Active Self  ?Cal Carb-Mag Hydrox-Simeth (ROLAIDS ADVANCED PO) [id number] Yes Take 1 tablet by mouth as needed (indigestion). [provider] Taking Active   ?calcium carbonate (TUMS - DOSED IN MG ELEMENTAL CALCIUM) 500 MG chewable tablet [id number] Yes Chew 1 tablet by mouth daily as needed for indigestion or heartburn. [provider] Taking Active   ?cholecalciferol (VITAMIN D) 1000 UNITS tablet [id number] Yes Take 2,000 Units by mouth daily.  [provider] Taking Active Self  ?hydrochlorothiazide (HYDRODIURIL) 25 MG tablet [id number] Yes Take 1 tablet (25 mg total) by mouth daily. Tower, Wynelle Fanny, MD Taking Active   ?metFORMIN (GLUCOPHAGE) 500 MG tablet [id number] Yes Take 1 tablet (500 mg total) by mouth 2  (two) times daily with a meal. Tower, Wynelle Fanny, MD Taking Active   ?metoprolol tartrate (LOPRESSOR) 100 MG tablet [id number] Yes Take 1 tablet (100 mg total) by mouth 2 (two) times daily. Tower, Wynelle Fanny, MD Taking A

## 2021-12-02 DIAGNOSIS — M810 Age-related osteoporosis without current pathological fracture: Secondary | ICD-10-CM

## 2021-12-02 DIAGNOSIS — E1169 Type 2 diabetes mellitus with other specified complication: Secondary | ICD-10-CM | POA: Diagnosis not present

## 2021-12-02 DIAGNOSIS — I10 Essential (primary) hypertension: Secondary | ICD-10-CM | POA: Diagnosis not present

## 2021-12-02 DIAGNOSIS — E785 Hyperlipidemia, unspecified: Secondary | ICD-10-CM

## 2021-12-02 DIAGNOSIS — E119 Type 2 diabetes mellitus without complications: Secondary | ICD-10-CM | POA: Diagnosis not present

## 2021-12-29 DIAGNOSIS — E119 Type 2 diabetes mellitus without complications: Secondary | ICD-10-CM | POA: Diagnosis not present

## 2021-12-29 DIAGNOSIS — H401132 Primary open-angle glaucoma, bilateral, moderate stage: Secondary | ICD-10-CM | POA: Diagnosis not present

## 2021-12-29 DIAGNOSIS — D3121 Benign neoplasm of right retina: Secondary | ICD-10-CM | POA: Diagnosis not present

## 2021-12-29 DIAGNOSIS — H2513 Age-related nuclear cataract, bilateral: Secondary | ICD-10-CM | POA: Diagnosis not present

## 2022-01-04 DIAGNOSIS — H2512 Age-related nuclear cataract, left eye: Secondary | ICD-10-CM | POA: Diagnosis not present

## 2022-01-09 ENCOUNTER — Encounter: Payer: Self-pay | Admitting: Ophthalmology

## 2022-01-11 ENCOUNTER — Other Ambulatory Visit: Payer: Self-pay | Admitting: Family Medicine

## 2022-01-12 NOTE — Discharge Instructions (Signed)

## 2022-01-17 ENCOUNTER — Encounter
Admission: RE | Disposition: A | Discharge status: Home / Self Care | Payer: Self-pay | Source: Home / Self Care | Attending: Ophthalmology

## 2022-01-17 ENCOUNTER — Encounter: Payer: Self-pay | Admitting: Ophthalmology

## 2022-01-17 ENCOUNTER — Ambulatory Visit: Payer: Medicare Other | Admitting: Anesthesiology

## 2022-01-17 ENCOUNTER — Ambulatory Visit
Admission: RE | Admit: 2022-01-17 | Discharge: 2022-01-17 | Disposition: A | Discharge status: Home / Self Care | Payer: Medicare Other | Attending: Ophthalmology | Admitting: Ophthalmology

## 2022-01-17 ENCOUNTER — Other Ambulatory Visit: Payer: Self-pay

## 2022-01-17 DIAGNOSIS — M1990 Unspecified osteoarthritis, unspecified site: Secondary | ICD-10-CM | POA: Diagnosis not present

## 2022-01-17 DIAGNOSIS — H2512 Age-related nuclear cataract, left eye: Secondary | ICD-10-CM | POA: Insufficient documentation

## 2022-01-17 DIAGNOSIS — I10 Essential (primary) hypertension: Secondary | ICD-10-CM | POA: Diagnosis not present

## 2022-01-17 DIAGNOSIS — F419 Anxiety disorder, unspecified: Secondary | ICD-10-CM | POA: Diagnosis not present

## 2022-01-17 DIAGNOSIS — Z7984 Long term (current) use of oral hypoglycemic drugs: Secondary | ICD-10-CM | POA: Insufficient documentation

## 2022-01-17 DIAGNOSIS — E1136 Type 2 diabetes mellitus with diabetic cataract: Secondary | ICD-10-CM | POA: Diagnosis not present

## 2022-01-17 DIAGNOSIS — H25812 Combined forms of age-related cataract, left eye: Secondary | ICD-10-CM | POA: Diagnosis not present

## 2022-01-17 HISTORY — DX: Localized enlarged lymph nodes: R59.0

## 2022-01-17 HISTORY — DX: Unspecified osteoarthritis, unspecified site: M19.90

## 2022-01-17 HISTORY — PX: CATARACT EXTRACTION W/PHACO: SHX586

## 2022-01-17 LAB — GLUCOSE, CAPILLARY
Glucose-Capillary: 129 mg/dL — ABNORMAL HIGH (ref 70–99)
Glucose-Capillary: 114 mg/dL — ABNORMAL HIGH (ref 70–99)

## 2022-01-17 SURGERY — PHACOEMULSIFICATION, CATARACT, WITH IOL INSERTION
Anesthesia: Monitor Anesthesia Care | Site: Eye | Laterality: Left

## 2022-01-17 MED ORDER — SIGHTPATH DOSE#1 NA CHONDROIT SULF-NA HYALURON 40-17 MG/ML IO SOLN
INTRAOCULAR | Status: DC | PRN
  Administered 2022-01-17: 1 mL via INTRAOCULAR

## 2022-01-17 MED ORDER — MIDAZOLAM HCL 2 MG/2ML IJ SOLN
INTRAMUSCULAR | Status: DC | PRN
  Administered 2022-01-17: .5 mg via INTRAVENOUS

## 2022-01-17 MED ORDER — TETRACAINE HCL 0.5 % OP SOLN
1.0000 [drp] | OPHTHALMIC | Status: DC | PRN
  Administered 2022-01-17 (×3): 1 [drp] via OPHTHALMIC

## 2022-01-17 MED ORDER — BRIMONIDINE TARTRATE-TIMOLOL 0.2-0.5 % OP SOLN
OPHTHALMIC | Status: DC | PRN
  Administered 2022-01-17: 1 [drp] via OPHTHALMIC

## 2022-01-17 MED ORDER — SIGHTPATH DOSE#1 BSS IO SOLN
INTRAOCULAR | Status: DC | PRN
  Administered 2022-01-17: 1 mL via INTRAMUSCULAR

## 2022-01-17 MED ORDER — CYCLOPENTOLATE HCL 2 % OP SOLN
1.0000 [drp] | OPHTHALMIC | Status: AC | PRN
Start: 2022-01-17 — End: 2022-01-17
  Administered 2022-01-17 (×3): 1 [drp] via OPHTHALMIC

## 2022-01-17 MED ORDER — MOXIFLOXACIN HCL 0.5 % OP SOLN
OPHTHALMIC | Status: DC | PRN
  Administered 2022-01-17: 0.2 mL via OPHTHALMIC

## 2022-01-17 MED ORDER — ACETAMINOPHEN 325 MG PO TABS: 325.0000 mg | ORAL_TABLET | ORAL | Status: DC | PRN

## 2022-01-17 MED ORDER — SIGHTPATH DOSE#1 BSS IO SOLN
INTRAOCULAR | Status: DC | PRN
  Administered 2022-01-17: 15 mL

## 2022-01-17 MED ORDER — FENTANYL CITRATE (PF) 100 MCG/2ML IJ SOLN
INTRAMUSCULAR | Status: DC | PRN
  Administered 2022-01-17: 50 ug via INTRAVENOUS

## 2022-01-17 MED ORDER — PHENYLEPHRINE HCL 10 % OP SOLN
1.0000 [drp] | OPHTHALMIC | Status: AC | PRN
  Administered 2022-01-17 (×3): 1 [drp] via OPHTHALMIC

## 2022-01-17 MED ORDER — SIGHTPATH DOSE#1 BSS IO SOLN
INTRAOCULAR | Status: DC | PRN
  Administered 2022-01-17: 120 mL via OPHTHALMIC

## 2022-01-17 MED ORDER — ACETAMINOPHEN 160 MG/5ML PO SOLN: 325.0000 mg | ORAL | Status: DC | PRN

## 2022-01-17 MED ORDER — ONDANSETRON HCL 4 MG/2ML IJ SOLN: 4.0000 mg | Freq: Once | INTRAMUSCULAR | Status: DC | PRN

## 2022-01-17 SURGICAL SUPPLY — 11 items
CATARACT SUITE SIGHTPATH (MISCELLANEOUS) ×2 IMPLANT
FEE CATARACT SUITE SIGHTPATH (MISCELLANEOUS) ×2 IMPLANT
GLOVE SURG ENC TEXT LTX SZ8 (GLOVE) ×3 IMPLANT
GLOVE SURG TRIUMPH 8.0 PF LTX (GLOVE) ×3 IMPLANT
LENS IOL RAYNER 23.0 (Intraocular Lens) ×2 IMPLANT
LENS IOL RAYONE EMV 23.0 (Intraocular Lens) IMPLANT
NDL FILTER BLUNT 18X1 1/2 (NEEDLE) ×2 IMPLANT
NEEDLE FILTER BLUNT 18X 1/2SAF (NEEDLE) ×1
NEEDLE FILTER BLUNT 18X1 1/2 (NEEDLE) ×1 IMPLANT
SYR 3ML LL SCALE MARK (SYRINGE) ×3 IMPLANT
WATER STERILE IRR 250ML POUR (IV SOLUTION) ×3 IMPLANT

## 2022-01-17 NOTE — Anesthesia Postprocedure Evaluation (Signed)
Anesthesia Post Note ? ?Patient: Kathy Cunningham ? ?Procedure(s) Performed: CATARACT EXTRACTION PHACO AND INTRAOCULAR LENS PLACEMENT (IOC) LEFT DIABETIC RAYNOR LENS (Left: Eye) ? ? ?  ?Patient location during evaluation: PACU ?Anesthesia Type: MAC ?Level of consciousness: awake ?Pain management: pain level controlled ?Vital Signs Assessment: post-procedure vital signs reviewed and stable ?Respiratory status: respiratory function stable ?Cardiovascular status: stable ?Postop Assessment: no signs of nausea or vomiting ?Anesthetic complications: no ? ? ?No notable events documented. ? ?Veda Canning ? ? ? ? ? ?

## 2022-01-17 NOTE — Transfer of Care (Signed)
Immediate Anesthesia Transfer of Care Note ? ?Patient: Kathy Cunningham ? ?Procedure(s) Performed: CATARACT EXTRACTION PHACO AND INTRAOCULAR LENS PLACEMENT (IOC) LEFT DIABETIC RAYNOR LENS (Left: Eye) ? ?Patient Location: PACU ? ?Anesthesia Type: MAC ? ?Level of Consciousness: awake, alert  and patient cooperative ? ?Airway and Oxygen Therapy: Patient Spontanous Breathing and Patient connected to supplemental oxygen ? ?Post-op Assessment: Post-op Vital signs reviewed, Patient's Cardiovascular Status Stable, Respiratory Function Stable, Patent Airway and No signs of Nausea or vomiting ? ?Post-op Vital Signs: Reviewed and stable ? ?Complications: No notable events documented. ? ?

## 2022-01-17 NOTE — Anesthesia Preprocedure Evaluation (Signed)
Anesthesia Evaluation  Patient identified by MRN, date of birth, ID band Patient awake    Reviewed: Allergy & Precautions, NPO status   Airway Mallampati: II  TM Distance: >3 FB     Dental   Pulmonary    Pulmonary exam normal        Cardiovascular hypertension,  Rhythm:Regular Rate:Normal     Neuro/Psych Anxiety    GI/Hepatic   Endo/Other  diabetes, Type 2  Renal/GU      Musculoskeletal  (+) Arthritis ,   Abdominal   Peds  Hematology  (+) Blood dyscrasia, anemia ,   Anesthesia Other Findings   Reproductive/Obstetrics                             Anesthesia Physical  Anesthesia Plan  ASA: 2  Anesthesia Plan: MAC   Post-op Pain Management: Minimal or no pain anticipated   Induction: Intravenous  PONV Risk Score and Plan: 2 and TIVA, Midazolam and Treatment may vary due to age or medical condition  Airway Management Planned: Natural Airway and Nasal Cannula  Additional Equipment:   Intra-op Plan:   Post-operative Plan:   Informed Consent: I have reviewed the patients History and Physical, chart, labs and discussed the procedure including the risks, benefits and alternatives for the proposed anesthesia with the patient or authorized representative who has indicated his/her understanding and acceptance.       Plan Discussed with: CRNA  Anesthesia Plan Comments:         Anesthesia Quick Evaluation  

## 2022-01-17 NOTE — Op Note (Signed)
PREOPERATIVE DIAGNOSIS:  Nuclear sclerotic cataract of the left eye. ?  ?POSTOPERATIVE DIAGNOSIS:  Nuclear sclerotic cataract of the left eye. ?  ?OPERATIVE PROCEDURE:ORPROCALL@ ?  ?SURGEON:  Birder Robson, MD. ?  ?ANESTHESIA: ? ?Anesthesiologist: Veda Canning, MD ?CRNA: Silvana Newness, CRNA ? ?1.      Managed anesthesia care. ?2.     0.44m of Shugarcaine was instilled following the paracentesis ?  ?COMPLICATIONS:  None. ?  ?TECHNIQUE:   Stop and chop ?  ?DESCRIPTION OF PROCEDURE:  The patient was examined and consented in the preoperative holding area where the aforementioned topical anesthesia was applied to the left eye and then brought back to the Operating Room where the left eye was prepped and draped in the usual sterile ophthalmic fashion and a lid speculum was placed. A paracentesis was created with the side port blade and the anterior chamber was filled with viscoelastic. A near clear corneal incision was performed with the steel keratome. A continuous curvilinear capsulorrhexis was performed with a cystotome followed by the capsulorrhexis forceps. Hydrodissection and hydrodelineation were carried out with BSS on a blunt cannula. The lens was removed in a stop and chop  technique and the remaining cortical material was removed with the irrigation-aspiration handpiece. The capsular bag was inflated with viscoelastic and the RAO 200 lens was placed in the capsular bag without complication. The remaining viscoelastic was removed from the eye with the irrigation-aspiration handpiece. The wounds were hydrated. The anterior chamber was flushed with BSS and the eye was inflated to physiologic pressure. 0.154mVigamox was placed in the anterior chamber. The wounds were found to be water tight. The eye was dressed with Combigan. The patient was given protective glasses to wear throughout the day and a shield with which to sleep tonight. The patient was also given drops with which to begin a drop regimen today and  will follow-up with me in one day. ?Implant Name Type Inv. Item Serial No. Manufacturer Lot No. LRB No. Used Action  ?LENS IOL RAYNER 23.0 - [id number] Lens LENS IOL RAYNER 23.0  [employer] [id number] Implanted  ?  ?Procedure(s) with comments: ?CATARACT EXTRACTION PHACO AND INTRAOCULAR LENS PLACEMENT (IOC) LEFT DIABETIC RAYNOR LENS (Left) - Diabetic ?19.50 ?02:06.5 ? ?Electronically signed: WiBirder Robson/16/2023 8:53 AM ? ?

## 2022-01-17 NOTE — H&P (Signed)
[place]  ? ?Primary Care Physician:  Tower, Wynelle Fanny, MD ?Ophthalmologist: Dr. George Ina ? ?Pre-Procedure History & Physical: ?HPI:  Kathy Cunningham is a 86 y.o. female here for cataract surgery. ?  ?Past Medical History:  ?Diagnosis Date  ? Anemia   ? Arthritis   ? hands  ? Diabetes mellitus   ? type II  ? Dizziness   ? or vertigo  ? Glaucoma   ? Hyperlipidemia   ? Hypertension   ? Posterior auricular lymphadenopathy   ? Right - "comes and goes"  ? ? ?Past Surgical History:  ?Procedure Laterality Date  ? bladder papilloma  2006  ? ROTATOR CUFF REPAIR  01/2002  ? vaginal hysterectomy    ? ? ?Prior to Admission medications   ?Medication Sig Start Date End Date Taking? Authorizing Provider  ?alendronate (FOSAMAX) 70 MG tablet Take 1 tablet (70 mg total) by mouth every 7 (seven) days. Take with a full glass of water on an empty stomach. 07/11/21  Yes Tower, Wynelle Fanny, MD  ?ALPRAZolam Duanne Moron) 0.5 MG tablet 1/2 to 1 pill by mouth twice daily as needed for anxiety with travel, caution of sedation ?Patient taking differently: Take 0.25-0.5 mg by mouth 2 (two) times daily as needed for anxiety. 1/2 to 1 pill by mouth twice daily as needed for anxiety with travel, caution of sedation 02/09/20  Yes Tower, Wynelle Fanny, MD  ?amLODipine (NORVASC) 10 MG tablet TAKE 1 TABLET BY MOUTH  DAILY 09/23/21  Yes Tower, Marne A, MD  ?bimatoprost (LUMIGAN) 0.01 % SOLN Place 1 drop into both eyes at bedtime.   Yes [provider]  ?brimonidine (ALPHAGAN P) 0.1 % SOLN Place 1 drop into both eyes 2 (two) times daily.   Yes [provider]  ?Cal Carb-Mag Hydrox-Simeth (ROLAIDS ADVANCED PO) Take 1 tablet by mouth as needed (indigestion).   Yes [provider]  ?calcium carbonate (TUMS - DOSED IN MG ELEMENTAL CALCIUM) 500 MG chewable tablet Chew 1 tablet by mouth daily as needed for indigestion or heartburn.   Yes [provider]  ?cholecalciferol (VITAMIN D) 1000 UNITS tablet Take 2,000 Units by mouth daily.     Yes [provider]  ?hydrochlorothiazide (HYDRODIURIL) 25 MG tablet Take 1 tablet (25 mg total) by mouth daily. 03/29/21  Yes Tower, Wynelle Fanny, MD  ?metFORMIN (GLUCOPHAGE) 500 MG tablet Take 1 tablet (500 mg total) by mouth 2 (two) times daily with a meal. 03/29/21  Yes Tower, Wynelle Fanny, MD  ?metoprolol tartrate (LOPRESSOR) 100 MG tablet Take 1 tablet (100 mg total) by mouth 2 (two) times daily. 03/29/21  Yes Tower, Wynelle Fanny, MD  ?olmesartan (BENICAR) 40 MG tablet Take 1 tablet (40 mg total) by mouth daily. 03/29/21  Yes Tower, Wynelle Fanny, MD  ?potassium chloride SA (KLOR-CON) 20 MEQ tablet Take 1 tablet (20 mEq total) by mouth daily. 03/29/21  Yes Tower, Wynelle Fanny, MD  ?rosuvastatin (CRESTOR) 20 MG tablet TAKE 1 TABLET BY MOUTH  DAILY AT NOON 01/12/22  Yes Tower, Wynelle Fanny, MD  ? ? ?Allergies as of 12/30/2021 - Review Complete 10/03/2021  ?Allergen Reaction Noted  ? Atorvastatin Other (See Comments) 07/17/2007  ? Sulfonamide derivatives Rash 07/17/2007  ? ? ?Family History  ?Problem Relation Age of Onset  ? Heart disease Mother   ?     MI  ? Heart attack Mother   ? Hypertension Mother   ? Heart disease Father   ?     MI  ?  Heart attack Father   ? Hypertension Father   ? Cancer Brother   ?     pancreatic   ? ? ?Social History  ? ?Socioeconomic History  ? Marital status: Widowed  ?  Spouse name: Not on file  ? Number of children: Not on file  ? Years of education: Not on file  ? Highest education level: Not on file  ?Occupational History  ? Not on file  ?Tobacco Use  ? Smoking status: Never  ? Smokeless tobacco: Never  ?Vaping Use  ? Vaping Use: Never used  ?Substance and Sexual Activity  ? Alcohol use: No  ?  Alcohol/week: 0.0 standard drinks  ? Drug use: No  ? Sexual activity: Never  ?Other Topics Concern  ? Not on file  ?Social History Narrative  ? Not on file  ? ?Social Determinants of Health  ? ?Financial Resource Strain: Low Risk   ? Difficulty of Paying Living Expenses: Not very hard  ?Food Insecurity: No Food  Insecurity  ? Worried About Charity fundraiser in the Last Year: Never true  ? Ran Out of Food in the Last Year: Never true  ?Transportation Needs: Not on file  ?Physical Activity: Not on file  ?Stress: Not on file  ?Social Connections: Not on file  ?Intimate Partner Violence: Not on file  ? ? ?Review of Systems: ?See HPI, otherwise negative ROS ? ?Physical Exam: ?BP (!) 173/77   Pulse 79   Temp (!) 97.5 ?F (36.4 ?C) (Temporal)   Ht 5' (1.524 m)   Wt 56.7 kg   SpO2 97%   BMI 24.41 kg/m?  ?General:   Alert, cooperative in NAD ?Head:  Normocephalic and atraumatic. ?Respiratory:  Normal work of breathing. ?Cardiovascular:  RRR ? ?Impression/Plan: ?Kathy Cunningham is here for cataract surgery. ? ?Risks, benefits, limitations, and alternatives regarding cataract surgery have been reviewed with the patient.  Questions have been answered.  All parties agreeable. ? ? ?Birder Robson, MD  01/17/2022, 8:22 AM ? ? ?

## 2022-01-18 ENCOUNTER — Other Ambulatory Visit: Payer: Self-pay

## 2022-01-18 ENCOUNTER — Encounter: Payer: Self-pay | Admitting: Ophthalmology

## 2022-01-25 NOTE — Discharge Instructions (Signed)

## 2022-01-30 ENCOUNTER — Other Ambulatory Visit: Payer: Self-pay | Admitting: Family Medicine

## 2022-01-31 ENCOUNTER — Encounter: Payer: Self-pay | Admitting: Ophthalmology

## 2022-01-31 ENCOUNTER — Ambulatory Visit
Admission: RE | Admit: 2022-01-31 | Discharge: 2022-01-31 | Disposition: A | Discharge status: Home / Self Care | Payer: Medicare Other | Attending: Ophthalmology | Admitting: Ophthalmology

## 2022-01-31 ENCOUNTER — Encounter
Admission: RE | Disposition: A | Discharge status: Home / Self Care | Payer: Self-pay | Source: Home / Self Care | Attending: Ophthalmology

## 2022-01-31 ENCOUNTER — Other Ambulatory Visit: Payer: Self-pay

## 2022-01-31 ENCOUNTER — Ambulatory Visit: Payer: Medicare Other | Admitting: Anesthesiology

## 2022-01-31 DIAGNOSIS — H2511 Age-related nuclear cataract, right eye: Secondary | ICD-10-CM | POA: Diagnosis not present

## 2022-01-31 DIAGNOSIS — Z7984 Long term (current) use of oral hypoglycemic drugs: Secondary | ICD-10-CM | POA: Diagnosis not present

## 2022-01-31 DIAGNOSIS — I10 Essential (primary) hypertension: Secondary | ICD-10-CM | POA: Diagnosis not present

## 2022-01-31 DIAGNOSIS — F419 Anxiety disorder, unspecified: Secondary | ICD-10-CM | POA: Diagnosis not present

## 2022-01-31 DIAGNOSIS — E1136 Type 2 diabetes mellitus with diabetic cataract: Secondary | ICD-10-CM | POA: Diagnosis not present

## 2022-01-31 DIAGNOSIS — Z8249 Family history of ischemic heart disease and other diseases of the circulatory system: Secondary | ICD-10-CM | POA: Diagnosis not present

## 2022-01-31 DIAGNOSIS — M1990 Unspecified osteoarthritis, unspecified site: Secondary | ICD-10-CM | POA: Diagnosis not present

## 2022-01-31 DIAGNOSIS — H25811 Combined forms of age-related cataract, right eye: Secondary | ICD-10-CM | POA: Diagnosis not present

## 2022-01-31 HISTORY — PX: CATARACT EXTRACTION W/PHACO: SHX586

## 2022-01-31 LAB — GLUCOSE, CAPILLARY
Glucose-Capillary: 81 mg/dL (ref 70–99)
Glucose-Capillary: 113 mg/dL — ABNORMAL HIGH (ref 70–99)

## 2022-01-31 SURGERY — PHACOEMULSIFICATION, CATARACT, WITH IOL INSERTION
Anesthesia: Monitor Anesthesia Care | Site: Eye | Laterality: Right

## 2022-01-31 MED ORDER — MIDAZOLAM HCL 2 MG/2ML IJ SOLN
INTRAMUSCULAR | Status: DC | PRN
  Administered 2022-01-31: 1 mg via INTRAVENOUS

## 2022-01-31 MED ORDER — SIGHTPATH DOSE#1 BSS IO SOLN
INTRAOCULAR | Status: DC | PRN
  Administered 2022-01-31: 15 mL

## 2022-01-31 MED ORDER — TETRACAINE HCL 0.5 % OP SOLN
1.0000 [drp] | OPHTHALMIC | Status: DC | PRN
  Administered 2022-01-31 (×3): 1 [drp] via OPHTHALMIC

## 2022-01-31 MED ORDER — MOXIFLOXACIN HCL 0.5 % OP SOLN
OPHTHALMIC | Status: DC | PRN
  Administered 2022-01-31: 0.2 mL via OPHTHALMIC

## 2022-01-31 MED ORDER — ARMC OPHTHALMIC DILATING DROPS
1.0000 "application " | OPHTHALMIC | Status: DC | PRN
  Administered 2022-01-31 (×3): 1 via OPHTHALMIC

## 2022-01-31 MED ORDER — SIGHTPATH DOSE#1 NA CHONDROIT SULF-NA HYALURON 40-17 MG/ML IO SOLN
INTRAOCULAR | Status: DC | PRN
  Administered 2022-01-31: 1 mL via INTRAOCULAR

## 2022-01-31 MED ORDER — FENTANYL CITRATE (PF) 100 MCG/2ML IJ SOLN
INTRAMUSCULAR | Status: DC | PRN
  Administered 2022-01-31: 50 ug via INTRAVENOUS

## 2022-01-31 MED ORDER — BRIMONIDINE TARTRATE-TIMOLOL 0.2-0.5 % OP SOLN
OPHTHALMIC | Status: DC | PRN
  Administered 2022-01-31: 1 [drp] via OPHTHALMIC

## 2022-01-31 MED ORDER — SIGHTPATH DOSE#1 BSS IO SOLN
INTRAOCULAR | Status: DC | PRN
  Administered 2022-01-31: 63 mL via OPHTHALMIC

## 2022-01-31 MED ORDER — SIGHTPATH DOSE#1 BSS IO SOLN
INTRAOCULAR | Status: DC | PRN
  Administered 2022-01-31: 1 mL

## 2022-01-31 SURGICAL SUPPLY — 17 items
CANNULA ANT/CHMB 27G (MISCELLANEOUS) IMPLANT
CANNULA ANT/CHMB 27GA (MISCELLANEOUS) IMPLANT
CATARACT SUITE SIGHTPATH (MISCELLANEOUS) ×2 IMPLANT
FEE CATARACT SUITE SIGHTPATH (MISCELLANEOUS) ×2 IMPLANT
GLOVE SURG ENC TEXT LTX SZ8 (GLOVE) ×3 IMPLANT
GLOVE SURG TRIUMPH 8.0 PF LTX (GLOVE) ×3 IMPLANT
LENS IOL RAYNER 23.0 (Intraocular Lens) ×2 IMPLANT
LENS IOL RAYONE EMV 23.0 (Intraocular Lens) IMPLANT
NDL FILTER BLUNT 18X1 1/2 (NEEDLE) ×2 IMPLANT
NEEDLE FILTER BLUNT 18X 1/2SAF (NEEDLE) ×1
NEEDLE FILTER BLUNT 18X1 1/2 (NEEDLE) ×1 IMPLANT
PACK VIT ANT 23G (MISCELLANEOUS) IMPLANT
RING MALYGIN (MISCELLANEOUS) IMPLANT
SUT ETHILON 10-0 CS-B-6CS-B-6 (SUTURE)
SUTURE EHLN 10-0 CS-B-6CS-B-6 (SUTURE) IMPLANT
SYR 3ML LL SCALE MARK (SYRINGE) ×3 IMPLANT
WATER STERILE IRR 250ML POUR (IV SOLUTION) ×3 IMPLANT

## 2022-01-31 NOTE — Op Note (Signed)
PREOPERATIVE DIAGNOSIS:  Nuclear sclerotic cataract of the right eye.   POSTOPERATIVE DIAGNOSIS:  Cataract   OPERATIVE PROCEDURE:ORPROCALL@   SURGEON:  Birder Robson, MD.   ANESTHESIA:  Anesthesiologist: Veda Canning, MD CRNA: Silvana Newness, CRNA  1.      Managed anesthesia care. 2.      0.54m of Shugarcaine was instilled in the eye following the paracentesis.   COMPLICATIONS:  None.   TECHNIQUE:   Stop and chop   DESCRIPTION OF PROCEDURE:  The patient was examined and consented in the preoperative holding area where the aforementioned topical anesthesia was applied to the right eye and then brought back to the Operating Room where the right eye was prepped and draped in the usual sterile ophthalmic fashion and a lid speculum was placed. A paracentesis was created with the side port blade and the anterior chamber was filled with viscoelastic. A near clear corneal incision was performed with the steel keratome. A continuous curvilinear capsulorrhexis was performed with a cystotome followed by the capsulorrhexis forceps. Hydrodissection and hydrodelineation were carried out with BSS on a blunt cannula. The lens was removed in a stop and chop  technique and the remaining cortical material was removed with the irrigation-aspiration handpiece. The capsular bag was inflated with viscoelastic and the RAO  lens was placed in the capsular bag without complication. The remaining viscoelastic was removed from the eye with the irrigation-aspiration handpiece. The wounds were hydrated. The anterior chamber was flushed with BSS and the eye was inflated to physiologic pressure. 0.140mof Vigamox was placed in the anterior chamber. The wounds were found to be water tight. The eye was dressed with Combigan. The patient was given protective glasses to wear throughout the day and a shield with which to sleep tonight. The patient was also given drops with which to begin a drop regimen today and will follow-up  with me in one day. Implant Name Type Inv. Item Serial No. Manufacturer Lot No. LRB No. Used Action  Rayner RayOne EMV [place]OL Intraocular Lens   [employer] [id number] 1 Implanted   Procedure(s) with comments: CATARACT EXTRACTION PHACO AND INTRAOCULAR LENS PLACEMENT (IOC) RIGHT DIABETIC RAYNOR LENS (Right) - 10.55 0:56.4  Electronically signed: WiBirder Robson/30/2023 8:48 AM

## 2022-01-31 NOTE — Transfer of Care (Signed)
Immediate Anesthesia Transfer of Care Note  Patient: Kathy Cunningham  Procedure(s) Performed: CATARACT EXTRACTION PHACO AND INTRAOCULAR LENS PLACEMENT (IOC) RIGHT DIABETIC RAYNOR LENS (Right: Eye)  Patient Location: PACU  Anesthesia Type: MAC  Level of Consciousness: awake, alert  and patient cooperative  Airway and Oxygen Therapy: Patient Spontanous Breathing and Patient connected to supplemental oxygen  Post-op Assessment: Post-op Vital signs reviewed, Patient's Cardiovascular Status Stable, Respiratory Function Stable, Patent Airway and No signs of Nausea or vomiting  Post-op Vital Signs: Reviewed and stable  Complications: No notable events documented.

## 2022-01-31 NOTE — H&P (Signed)
[facility]   Primary Care Physician:  Tower, Wynelle Fanny, MD Ophthalmologist: Dr. George Ina  Pre-Procedure History & Physical: HPI:  Kathy Cunningham is a 86 y.o. female here for cataract surgery.   Past Medical History:  Diagnosis Date   Anemia    Arthritis    hands   Diabetes mellitus    type II   Dizziness    or vertigo   Glaucoma    Hyperlipidemia    Hypertension    Posterior auricular lymphadenopathy    Right - "comes and goes"    Past Surgical History:  Procedure Laterality Date   bladder papilloma  2006   CATARACT EXTRACTION W/PHACO Left 01/17/2022   Procedure: CATARACT EXTRACTION PHACO AND INTRAOCULAR LENS PLACEMENT ([place]) LEFT DIABETIC RAYNOR LENS;  Surgeon: Birder Robson, MD;  Location: [facility];  Service: Ophthalmology;  Laterality: Left;  Diabetic 19.50 02:06.5   ROTATOR CUFF REPAIR  01/2002   vaginal hysterectomy      Prior to Admission medications   Medication Sig Start Date End Date Taking? Authorizing Provider  alendronate (FOSAMAX) 70 MG tablet Take 1 tablet (70 mg total) by mouth every 7 (seven) days. Take with a full glass of water on an empty stomach. 07/11/21  Yes Tower, Wynelle Fanny, MD  ALPRAZolam Duanne Moron) 0.5 MG tablet 1/2 to 1 pill by mouth twice daily as needed for anxiety with travel, caution of sedation Patient taking differently: Take 0.25-0.5 mg by mouth 2 (two) times daily as needed for anxiety. 1/2 to 1 pill by mouth twice daily as needed for anxiety with travel, caution of sedation 02/09/20  Yes Tower, Roque Lias A, MD  amLODipine (NORVASC) 10 MG tablet TAKE 1 TABLET BY MOUTH  DAILY 09/23/21  Yes Tower, Marne A, MD  bimatoprost (LUMIGAN) 0.01 % SOLN Place 1 drop into both eyes at bedtime.   Yes [provider]  brimonidine (ALPHAGAN P) 0.1 % SOLN Place 1 drop into both eyes 2 (two) times daily.   Yes [provider]  Cal Carb-Mag Hydrox-Simeth (ROLAIDS ADVANCED PO) Take 1 tablet by mouth as needed (indigestion).   Yes  [provider]  calcium carbonate (TUMS - DOSED IN MG ELEMENTAL CALCIUM) 500 MG chewable tablet Chew 1 tablet by mouth daily as needed for indigestion or heartburn.   Yes [provider]  cholecalciferol (VITAMIN D) 1000 UNITS tablet Take 2,000 Units by mouth daily.    Yes [provider]  hydrochlorothiazide (HYDRODIURIL) 25 MG tablet Take 1 tablet (25 mg total) by mouth daily. 03/29/21  Yes Tower, Wynelle Fanny, MD  metFORMIN (GLUCOPHAGE) 500 MG tablet Take 1 tablet (500 mg total) by mouth 2 (two) times daily with a meal. 03/29/21  Yes Tower, Wynelle Fanny, MD  metoprolol tartrate (LOPRESSOR) 100 MG tablet Take 1 tablet (100 mg total) by mouth 2 (two) times daily. 03/29/21  Yes Tower, Wynelle Fanny, MD  olmesartan (BENICAR) 40 MG tablet Take 1 tablet (40 mg total) by mouth daily. 03/29/21  Yes Tower, Wynelle Fanny, MD  potassium chloride SA (KLOR-CON) 20 MEQ tablet Take 1 tablet (20 mEq total) by mouth daily. 03/29/21  Yes Tower, Wynelle Fanny, MD  rosuvastatin (CRESTOR) 20 MG tablet TAKE 1 TABLET BY MOUTH  DAILY AT NOON 01/12/22  Yes Tower, Wynelle Fanny, MD    Allergies as of 12/30/2021 - Review Complete 10/03/2021  Allergen Reaction Noted   Atorvastatin Other (See Comments) 07/17/2007   Sulfonamide derivatives Rash 07/17/2007    Family History  Problem  Relation Age of Onset   Heart disease Mother        MI   Heart attack Mother    Hypertension Mother    Heart disease Father        MI   Heart attack Father    Hypertension Father    Cancer Brother        pancreatic     Social History   Socioeconomic History   Marital status: Widowed    Spouse name: Not on file   Number of children: Not on file   Years of education: Not on file   Highest education level: Not on file  Occupational History   Not on file  Tobacco Use   Smoking status: Never   Smokeless tobacco: Never  Vaping Use   Vaping Use: Never used  Substance and Sexual Activity   Alcohol use: No    Alcohol/week: 0.0 standard  drinks   Drug use: No   Sexual activity: Never  Other Topics Concern   Not on file  Social History Narrative   Not on file   Social Determinants of Health   Financial Resource Strain: Low Risk    Difficulty of Paying Living Expenses: Not very hard  Food Insecurity: No Food Insecurity   Worried About Charity fundraiser in the Last Year: Never true   Ran Out of Food in the Last Year: Never true  Transportation Needs: Not on file  Physical Activity: Not on file  Stress: Not on file  Social Connections: Not on file  Intimate Partner Violence: Not on file    Review of Systems: See HPI, otherwise negative ROS  Physical Exam: BP (!) 151/71   Pulse 68   Temp (!) 97.1 F (36.2 C) (Temporal)   Wt 57.2 kg   SpO2 98%   BMI 24.61 kg/m  General:   Alert, cooperative in NAD Head:  Normocephalic and atraumatic. Respiratory:  Normal work of breathing. Cardiovascular:  RRR  Impression/Plan: Kathy Cunningham is here for cataract surgery.  Risks, benefits, limitations, and alternatives regarding cataract surgery have been reviewed with the patient.  Questions have been answered.  All parties agreeable.   Birder Robson, MD  01/31/2022, 8:23 AM

## 2022-01-31 NOTE — Anesthesia Preprocedure Evaluation (Signed)
Anesthesia Evaluation  Patient identified by MRN, date of birth, ID band Patient awake    Reviewed: Allergy & Precautions, NPO status   Airway Mallampati: II  TM Distance: >3 FB     Dental   Pulmonary    Pulmonary exam normal        Cardiovascular hypertension,  Rhythm:Regular Rate:Normal     Neuro/Psych Anxiety    GI/Hepatic   Endo/Other  diabetes, Type 2  Renal/GU      Musculoskeletal  (+) Arthritis ,   Abdominal   Peds  Hematology  (+) Blood dyscrasia, anemia ,   Anesthesia Other Findings   Reproductive/Obstetrics                             Anesthesia Physical  Anesthesia Plan  ASA: 2  Anesthesia Plan: MAC   Post-op Pain Management: Minimal or no pain anticipated   Induction: Intravenous  PONV Risk Score and Plan: 2 and TIVA, Midazolam and Treatment may vary due to age or medical condition  Airway Management Planned: Natural Airway and Nasal Cannula  Additional Equipment:   Intra-op Plan:   Post-operative Plan:   Informed Consent: I have reviewed the patients History and Physical, chart, labs and discussed the procedure including the risks, benefits and alternatives for the proposed anesthesia with the patient or authorized representative who has indicated his/her understanding and acceptance.       Plan Discussed with: CRNA  Anesthesia Plan Comments:         Anesthesia Quick Evaluation

## 2022-01-31 NOTE — Anesthesia Postprocedure Evaluation (Signed)
Anesthesia Post Note  Patient: Kathy Cunningham  Procedure(s) Performed: CATARACT EXTRACTION PHACO AND INTRAOCULAR LENS PLACEMENT (IOC) RIGHT DIABETIC RAYNOR LENS (Right: Eye)     Patient location during evaluation: PACU Anesthesia Type: MAC Level of consciousness: awake Pain management: pain level controlled Vital Signs Assessment: post-procedure vital signs reviewed and stable Respiratory status: respiratory function stable Cardiovascular status: stable Postop Assessment: no apparent nausea or vomiting Anesthetic complications: no   No notable events documented.  Veda Canning

## 2022-02-01 ENCOUNTER — Encounter: Payer: Self-pay | Admitting: Ophthalmology

## 2022-02-02 ENCOUNTER — Telehealth: Payer: Self-pay

## 2022-02-02 DIAGNOSIS — E119 Type 2 diabetes mellitus without complications: Secondary | ICD-10-CM

## 2022-02-02 MED ORDER — ACCU-CHEK GUIDE VI STRP: ORAL_STRIP | 3 refills | Status: AC

## 2022-02-02 NOTE — Progress Notes (Signed)
Chronic Care Management Pharmacy Assistant   Name: Kathy Cunningham  MRN: [id number] DOB: 02-08-1936  Reason for Encounter: CCM (Hypertension Disease State)   Recent office visits:  None since last CCM contact'  Recent consult visits:  01/31/22 [place] - Right Cataract extraction and intraocular lens placement No med changes 01/17/22 [place] - Left Cataract extraction and intraocular lens placement No med changes  Hospital visits:  None in previous 6 months  Medications: Outpatient Encounter Medications as of 02/02/2022  Medication Sig   metFORMIN (GLUCOPHAGE) 500 MG tablet TAKE 1 TABLET BY MOUTH  TWICE DAILY WITH A MEAL   metoprolol tartrate (LOPRESSOR) 100 MG tablet TAKE 1 TABLET BY MOUTH  TWICE DAILY   olmesartan (BENICAR) 40 MG tablet TAKE 1 TABLET BY MOUTH  DAILY   potassium chloride SA (KLOR-CON M) 20 MEQ tablet TAKE 1 TABLET BY MOUTH  DAILY   alendronate (FOSAMAX) 70 MG tablet Take 1 tablet (70 mg total) by mouth every 7 (seven) days. Take with a full glass of water on an empty stomach.   ALPRAZolam (XANAX) 0.5 MG tablet 1/2 to 1 pill by mouth twice daily as needed for anxiety with travel, caution of sedation (Patient taking differently: Take 0.25-0.5 mg by mouth 2 (two) times daily as needed for anxiety. 1/2 to 1 pill by mouth twice daily as needed for anxiety with travel, caution of sedation)   amLODipine (NORVASC) 10 MG tablet TAKE 1 TABLET BY MOUTH  DAILY   bimatoprost (LUMIGAN) 0.01 % SOLN Place 1 drop into both eyes at bedtime.   brimonidine (ALPHAGAN P) 0.1 % SOLN Place 1 drop into both eyes 2 (two) times daily.   Cal Carb-Mag Hydrox-Simeth (ROLAIDS ADVANCED PO) Take 1 tablet by mouth as needed (indigestion).   calcium carbonate (TUMS - DOSED IN MG ELEMENTAL CALCIUM) 500 MG chewable tablet Chew 1 tablet by mouth daily as needed for indigestion or heartburn.   cholecalciferol (VITAMIN D) 1000 UNITS tablet Take 2,000 Units by mouth daily.     hydrochlorothiazide (HYDRODIURIL) 25 MG tablet Take 1 tablet (25 mg total) by mouth daily.   rosuvastatin (CRESTOR) 20 MG tablet TAKE 1 TABLET BY MOUTH  DAILY AT NOON   No facility-administered encounter medications on file as of 02/02/2022.   Recent Office Vitals: BP Readings from Last 3 Encounters:  01/31/22 (!) 116/48  01/17/22 (!) 99/50  10/03/21 133/70   Pulse Readings from Last 3 Encounters:  01/31/22 63  01/17/22 64  10/03/21 80    Wt Readings from Last 3 Encounters:  01/31/22 126 lb (57.2 kg)  01/17/22 125 lb (56.7 kg)  10/03/21 129 lb 6 oz (58.7 kg)    Kidney Function Lab Results  Component Value Date/Time   CREATININE 0.95 10/03/2021 09:56 AM   CREATININE 0.98 03/29/2021 12:12 PM   GFR 55.25 (L) 10/03/2021 09:56 AM   GFRNONAA 50 (L) 02/01/2021 02:22 AM   GFRAA 48 (L) 06/06/2020 03:08 PM      Latest Ref Rng & Units 10/03/2021    9:56 AM 03/29/2021   12:12 PM 02/01/2021    2:22 AM  BMP  Glucose 70 - 99 mg/dL 131   105   143    BUN 6 - 23 mg/dL '18   13   18    '$ Creatinine 0.40 - 1.20 mg/dL 0.95   0.98   1.09    Sodium 135 - 145 mEq/L 141   140   138  Potassium 3.5 - 5.1 mEq/L 3.7   4.1   3.2    Chloride 96 - 112 mEq/L 103   103   102    CO2 19 - 32 mEq/L '29   30   26    '$ Calcium 8.4 - 10.5 mg/dL 9.7   9.6   9.0     Contacted patient on 02/02/2022 to discuss hypertension disease state  Current antihypertensive regimen:  Amlodipine 10 mg daily AM  HCTZ 25 mg daily AM  Potassium 20 mEq  daily AM  Metoprolol tartrate 100 mg BID  Olmesartan 40 mg daily PM  Patient verbally confirms she is taking the above medications as directed. Yes  How often are you checking your Blood Pressure? Patient did not have a log. I asked patient to take her blood pressure and blood sugar daily starting today until 06/09. Advised her I would call back for her log. Patient agreed.   infrequently  Wrist or arm cuff: Wrist Caffeine intake: Coke - 1 a day and 1/2 and 1/2 tea - once a  month Salt intake: Watches her salt intake  Over the counter medications including pseudoephedrine or NSAIDs? No OTC  What recent interventions/DTPs have been made by any provider to improve Blood Pressure control since last CPP Visit:  Continue current medications and monitor BP at home 1-2x weekly.   Any recent hospitalizations or ED visits since last visit with CPP? No  What diet changes have been made to improve Blood Pressure Control?  Patient stated she was getting french fries from [employer] every day and has stopped that due to Dr. Marliss Coots recommendation.   What exercise is being done to improve your Blood Pressure Control?  Patient walks around [place] parking lot  Patient stated she has run out of [employer] Strips so she can not check her blood glucose. Patient would like a refill [facility] to [facility].   Adherence Review: Is the patient currently on ACE/ARB medication? Yes Does the patient have >5 day gap between last estimated fill dates? No  Star Rating Drugs:  Medication:  Last Fill: Day Supply Rosuvastatin 20 mg 11/07/2021 100 Metformin 500 mg 12/06/2021 90 Olmesartan 40 mg 12/06/2021 90  Care Gaps: Annual wellness visit in last year? Yes 03/29/2021 Most Recent BP reading: 116/48 on 01/31/2022  If Diabetic: Most recent A1C reading: 6.3 on 10/03/2021 Last eye exam / retinopathy screening: Up to date Last diabetic foot exam: Up to date  Upcoming appointments: CCM appointment on 11/25/2021  Charlene Brooke, CPP notified  [name], [place] Assistant [phone]

## 2022-02-02 NOTE — Addendum Note (Signed)
Addended by: Charlton Haws on: 02/02/2022 03:25 PM   Modules accepted: Orders

## 2022-02-02 NOTE — Telephone Encounter (Signed)
Test strips refilled per patient request.

## 2022-02-10 NOTE — Progress Notes (Addendum)
Called patient for her blood pressure log as previously discussed.   Date Blood Pressure Blood Sugar (Fasting) 06/12 -----   98 06/11 -----   153 06/10 -----   110 - Patient got test strips this day; was out previously 06/09 121/45 109/61   06/08 136/96      06/07 -------    06/06 138/67 06/05 130/77 06/04 137/78 06/03 ------- 06/02 138/67    Charlene Brooke, CPP notified  [name], [place] Pharmacy Assistant [phone]

## 2022-03-03 ENCOUNTER — Other Ambulatory Visit: Payer: Self-pay | Admitting: Family Medicine

## 2022-03-27 ENCOUNTER — Telehealth: Payer: Self-pay | Admitting: Family Medicine

## 2022-03-27 NOTE — Telephone Encounter (Signed)
Left message for patient to call back and schedule Medicare Annual Wellness Visit (AWV) either virtually or phone   Last AWV 03/29/21   I left my direct # [phone]

## 2022-04-06 ENCOUNTER — Telehealth: Payer: Self-pay | Admitting: Family Medicine

## 2022-04-06 NOTE — Telephone Encounter (Signed)
Called and informed pt informed pt that she has refill on test strips to call the pharmacy ask them to fill medication.

## 2022-04-06 NOTE — Telephone Encounter (Signed)
  Encourage patient to contact the pharmacy for refills or they can request refills through [facility]  Did the patient contact the pharmacy:  n   LAST APPOINTMENT DATE:  10/03/21 NEXT APPOINTMENT DATE:  MEDICATION:glucose blood (ACCU-CHEK GUIDE) test strip    Is the patient out of medication? y  If not, how much is left?none  Is this a 79 day supply: y  PH[facility]ACY[id number] [place] [address] Phone:  [phone] Fax:  [phone]   Patient also needs needles  Let patient know to contact pharmacy at the end of the day to make sure medication is ready.  Please notify patient to allow 48-72 hours to process

## 2022-04-25 ENCOUNTER — Other Ambulatory Visit: Payer: Self-pay | Admitting: Family Medicine

## 2022-05-05 ENCOUNTER — Other Ambulatory Visit: Payer: Self-pay | Admitting: Family Medicine

## 2022-05-11 ENCOUNTER — Telehealth: Payer: Self-pay | Admitting: Family Medicine

## 2022-05-11 NOTE — Telephone Encounter (Signed)
Pt called in requesting a call back [phone]

## 2022-05-16 ENCOUNTER — Telehealth: Payer: Self-pay

## 2022-05-16 NOTE — Progress Notes (Signed)
Called patient; she would like to have her blood pressure cuff evaluated to see if it is reading correctly. Patient was made an in-office appointment with Charlene Brooke on 05/24/22 at 9:30 am.   Charlene Brooke, CPP notified  [name], [place] Pharmacy Assistant [phone]

## 2022-05-16 NOTE — Telephone Encounter (Signed)
-----   Message from Charlton Haws, [facility] sent at 05/11/2022 12:07 PM EDT ----- Regarding: call pt Received VM from pt, I think she is wanting to discuss [facility]? Mentioned a pharmacy in [place] - pls call and see what she wants. Onboard if needed.

## 2022-05-24 ENCOUNTER — Ambulatory Visit: Payer: Medicare Other | Admitting: Pharmacist

## 2022-05-24 DIAGNOSIS — E119 Type 2 diabetes mellitus without complications: Secondary | ICD-10-CM

## 2022-05-24 DIAGNOSIS — M810 Age-related osteoporosis without current pathological fracture: Secondary | ICD-10-CM

## 2022-05-24 DIAGNOSIS — I10 Essential (primary) hypertension: Secondary | ICD-10-CM

## 2022-05-24 DIAGNOSIS — E1169 Type 2 diabetes mellitus with other specified complication: Secondary | ICD-10-CM

## 2022-05-24 MED ORDER — ACCU-CHEK SOFTCLIX LANCETS MISC: 3 refills | Status: AC

## 2022-05-24 NOTE — Patient Instructions (Addendum)
Visit Information  Phone number for Pharmacist: [phone]   Goals Addressed   None     Care Plan : [place]  Updates made by Charlton Haws, RPH since 05/24/2022 12:00 AM     Problem: Hypertension, Hyperlipidemia, Diabetes, and Osteoporosis   Priority: High     Long-Range Goal: Disease Management   Start Date: 05/10/2021  Expected End Date: 11/29/2022  This Visit's Progress: On track  Recent Progress: On track  Priority: High  Note:   Current Barriers:  Query accuracy of BP monitor  Pharmacist Clinical Goal(s):  Patient will achieve adherence to monitoring guidelines and medication adherence to achieve therapeutic efficacy contact provider office for questions/concerns as evidenced notation of same in electronic health record through collaboration with PharmD and provider.   Interventions: 1:1 collaboration with Tower, Wynelle Fanny, MD regarding development and update of comprehensive plan of care as evidenced by provider attestation and co-signature Inter-disciplinary care team collaboration (see longitudinal plan of care) Comprehensive medication review performed; medication list updated in electronic medical record  Hypertension (BP goal <140/90) -Controlled -per home readings; pt is concerned about accuracy of home wrist monitor, she brought it to office today  -Current home BP readings: 110s/50s - 140/70s at home Wrist cuff: 154/103, P 68 Omron: 142/69, P 67 Manual: 144/64, P 68 -Discussed BP monitors - wrist cuffs tends to be less accurate, advised pt to get an arm-cuff automatic monitor -Current treatment: Amlodipine 10 mg daily AM - Appropriate, Effective, Safe, Accessible HCTZ 25 mg daily AM -Appropriate, Effective, Safe, Accessible Potassium 20 mEq  daily AM -Appropriate, Effective, Safe, Accessible Metoprolol tartrate 100 mg BID -Appropriate, Effective, Safe, Accessible Olmesartan 40 mg daily PM -Appropriate, Effective, Safe,  Accessible -Medications previously tried: none  -Educated on BP goals and benefits of medications for prevention of heart attack, stroke and kidney damage; Importance of home blood pressure monitoring; Proper BP monitoring technique; Symptoms of hypotension and importance of maintaining adequate hydration; -Counseled to monitor BP at home 3x weekly  -Recommended to continue current medication; advised pt to contact provider if BP consistently > 150/90 or < 100/50  Hyperlipidemia: (LDL goal < 100) -Controlled - LDL 68 (09/2021), adherence affirmed  -Current treatment: Rosuvastatin 20 mg daily PM -Appropriate, Effective, Safe, Accessible -Medications previously tried: none  -Educated on: importance of daily adherence -Recommended to continue current medication  Diabetes (A1c goal <7%) -Controlled - A1c 6.3% (09/2021) -Home BG: 110-140 typically -Current medications: Metformin 500 mg BID -Appropriate, Effective, Safe, Accessible Testing supplies  -Medications previously tried: none  -Educated on Prevention and management of hypoglycemic episodes -Pt is due for A1c and kidney evaluation; recommend lab eval at next OV -Recommended to continue current medication  Osteoporosis (Goal: prevent fractures) -Controlled - planning 5 years of Fosamax -Last DEXA Scan: 06/22/21   T-Score femoral neck: -3.0  T-Score lumbar spine: -1.2 -Patient is a candidate for pharmacologic treatment due to T-Score < -2.5 in femoral neck -Current treatment  Alendronate 70  mg weekly (started 07/2021) - Appropriate, Effective, Safe, Accessible -Medications previously tried: risedronate (x 5 years)  -Recommend weight-bearing and muscle strengthening exercises for building and maintaining bone density. -Recommended to continue current medication  Patient Goals/Self-Care Activities Patient will:  - take medications as prescribed as evidenced by patient report and record review focus on medication adherence by  pill box check blood pressure 1-2x weekly, document, and provide at future appointments       Patient verbalizes understanding of instructions and care plan  provided today and agrees to view in [place]. Active MyChart status and patient understanding of how to access instructions and care plan via MyChart confirmed with patient.    Telephone follow up appointment with pharmacy team member scheduled for: 6 months  Charlene Brooke, PharmD, [facility] Clinical Pharmacist [facility] [phone]

## 2022-05-24 NOTE — Progress Notes (Signed)
Chronic Care Management Pharmacy Note  05/24/2022 Name:  Kathy Cunningham MRN:  [id number] DOB:  [birth year]  Summary: CCM F/U in-person visit -Pt presented to verify accuracy of home BP monitor; she has wrist cuff at home Wrist cuff: 154/103, P 68 Omron: 142/69, P 67 Manual: 144/64, P 68 -Discussed wrist cuff is less accurate than arm cuffs, advised she use arm cuff -Reviewed BP monitoring technique, BP goals; s/sx of hypotension   Recommendations/Changes made from today's visit: -No med changes; advised to contact provider if BP consistently > 150/90 or < 100/50   Plan: -[facility] will call patient 3 months for BP update -Pharmacist follow up televisit scheduled for 6 months -PCP annual due 10/03/22    Subjective: Kathy Cunningham is an 86 y.o. year old female who is a primary patient of Tower, Wynelle Fanny, MD.  The CCM team was consulted for assistance with disease management and care coordination needs.    Engaged with patient by telephone for follow up visit in response to provider referral for pharmacy case management and/or care coordination services.   Consent to Services:  The patient was given information about Chronic Care Management services, agreed to services, and gave verbal consent prior to initiation of services.  Please see initial visit note for detailed documentation.   Patient Care Team: Tower, Wynelle Fanny, MD as PCP - General Birder Robson, MD as Referring Physician (Ophthalmology) Charlton Haws, [facility] as Pharmacist (Pharmacist)  Recent office visits: 10/03/21 Dr Glori Bickers OV: chronic f/u; labs stable; no changes.  07/11/21 Dr Glori Bickers OV: f/u osteoporosis; start alendronate 70 mg weekly.  Recent consult visits: 08/26/21 Dr George Ina (ophthalmology): f/u cataract  Hospital visits: None in previous 6 months   Objective:  Lab Results  Component Value Date   CREATININE 0.95 10/03/2021   BUN 18 10/03/2021   GFR 55.25 (L) 10/03/2021   GFRNONAA 50  (L) 02/01/2021   GFRAA 48 (L) 06/06/2020   NA 141 10/03/2021   K 3.7 10/03/2021   CALCIUM 9.7 10/03/2021   CO2 29 10/03/2021   GLUCOSE 131 (H) 10/03/2021    Lab Results  Component Value Date/Time   HGBA1C 6.3 10/03/2021 09:56 AM   HGBA1C 6.1 03/29/2021 12:12 PM   GFR 55.25 (L) 10/03/2021 09:56 AM   GFR 53.42 (L) 03/29/2021 12:12 PM   MICROALBUR 4.8 (H) 10/04/2009 09:15 AM    Last diabetic Eye exam:  Lab Results  Component Value Date/Time   HMDIABEYEEXA No Retinopathy 08/26/2021 12:00 AM    Last diabetic Foot exam:  Lab Results  Component Value Date/Time   HMDIABFOOTEX yes 10/20/2010 12:00 AM     Lab Results  Component Value Date   CHOL 160 10/03/2021   HDL 73.40 10/03/2021   LDLCALC 68 10/03/2021   TRIG 93.0 10/03/2021   CHOLHDL 2 10/03/2021       Latest Ref Rng & Units 10/03/2021    9:56 AM 03/29/2021   12:12 PM 06/06/2020    3:08 PM  Hepatic Function  Total Protein 6.0 - 8.3 g/dL 7.4  7.2  7.6   Albumin 3.5 - 5.2 g/dL 4.4  4.4  4.3   AST 0 - 37 U/L _0 ALT 0 - 35 U/L _1 Alk Phosphatase 39 - 117 U/L 59  64  47   Total Bilirubin 0.2 - 1.2 mg/dL 0.7  0.6  0.9     Lab Results  Component Value  Date/Time   TSH 1.09 03/29/2021 12:12 PM   TSH 1.38 04/15/2019 09:09 AM       Latest Ref Rng & Units 03/29/2021   12:12 PM 02/01/2021    2:22 AM 06/06/2020    3:08 PM  CBC  WBC 4.0 - 10.5 K/uL 4.6  7.5  2.8   Hemoglobin 12.0 - 15.0 g/dL 11.5  11.1  11.3   Hematocrit 36.0 - 46.0 % 35.3  34.5  33.9   Platelets 150.0 - 400.0 K/uL 286.0  282  218     Lab Results  Component Value Date/Time   VD25OH 39.91 03/29/2021 12:12 PM   VD25OH 43.24 04/15/2019 09:09 AM    Clinical ASCVD: No  The ASCVD Risk score (Arnett DK, et al., 2019) failed to calculate for the following reasons:   The 2019 ASCVD risk score is only valid for ages 26 to 24       03/29/2021   11:35 AM 04/21/2019   11:29 AM 04/09/2018   11:12 AM  Depression screen PHQ 2/9  Decreased  Interest 0 0 2  Down, Depressed, Hopeless 0 0 0  PHQ - 2 Score 0 0 2  Altered sleeping   0  Tired, decreased energy   0  Change in appetite   0  Feeling bad or failure about yourself    0  Trouble concentrating   1  Moving slowly or fidgety/restless   0  Suicidal thoughts   0  PHQ-9 Score   3  Difficult doing work/chores   Not difficult at all      Social History   Tobacco Use  Smoking Status Never  Smokeless Tobacco Never   BP Readings from Last 3 Encounters:  01/31/22 (!) 116/48  01/17/22 (!) 99/50  10/03/21 133/70   Pulse Readings from Last 3 Encounters:  01/31/22 63  01/17/22 64  10/03/21 80   Wt Readings from Last 3 Encounters:  01/31/22 126 lb (57.2 kg)  01/17/22 125 lb (56.7 kg)  10/03/21 129 lb 6 oz (58.7 kg)   BMI Readings from Last 3 Encounters:  01/31/22 24.61 kg/m  01/17/22 24.41 kg/m  10/03/21 25.27 kg/m    Assessment/Interventions: Review of patient past medical history, allergies, medications, health status, including review of consultants reports, laboratory and other test data, was performed as part of comprehensive evaluation and provision of chronic care management services.   SDOH:  (Social Determinants of Health) assessments and interventions performed: Yes SDOH Interventions    Flowsheet Row Chronic Care Management from 11/28/2021 in [place] at [place] Management from 05/10/2021 in [facility] at [place] Interventions Intervention Not Indicated --  Housing Interventions Intervention Not Indicated --  Financial Strain Interventions -- Intervention Not Indicated      Gates: No Food Insecurity (11/28/2021)  Housing: Low Risk  (11/28/2021)  Depression (PHQ2-9): Low Risk  (03/29/2021)  Financial Resource Strain: Low Risk  (05/10/2021)  Tobacco Use: Low Risk  (02/01/2022)    CCM Care Plan  Allergies  Allergen Reactions   Atorvastatin  Other (See Comments)    REACTION: cramps   Sulfonamide Derivatives Rash    Medications Reviewed Today     Reviewed by Charlton Haws, [facility] (Pharmacist) on 05/24/22 at [phone]  Med List Status: <None>   Medication Order Taking? Sig Documenting Provider Last Dose Status Informant  alendronate (FOSAMAX) 70 MG tablet [id number] Yes Take 1 tablet (  70 mg total) by mouth every 7 (seven) days. Take with a full glass of water on an empty stomach. Tower, Wynelle Fanny, MD Taking Active   ALPRAZolam Duanne Moron) 0.5 MG tablet [id number] Yes 1/2 to 1 pill by mouth twice daily as needed for anxiety with travel, caution of sedation  Patient taking differently: Take 0.25-0.5 mg by mouth 2 (two) times daily as needed for anxiety. 1/2 to 1 pill by mouth twice daily as needed for anxiety with travel, caution of sedation   Tower, Wynelle Fanny, MD Taking Active Self  amLODipine (NORVASC) 10 MG tablet [id number] Yes TAKE 1 TABLET BY MOUTH DAILY Tower, Wynelle Fanny, MD Taking Active   bimatoprost (LUMIGAN) 0.01 % SOLN [id number] Yes Place 1 drop into both eyes at bedtime. [provider] Taking Active Self  brimonidine (ALPHAGAN P) 0.1 % SOLN [id number] Yes Place 1 drop into both eyes 2 (two) times daily. [provider] Taking Active Self  Cal Carb-Mag Hydrox-Simeth (ROLAIDS ADVANCED PO) [id number] Yes Take 1 tablet by mouth as needed (indigestion). [provider] Taking Active   calcium carbonate (TUMS - DOSED IN MG ELEMENTAL CALCIUM) 500 MG chewable tablet [id number] Yes Chew 1 tablet by mouth daily as needed for indigestion or heartburn. [provider] Taking Active   cholecalciferol (VITAMIN D) 1000 UNITS tablet [id number] Yes Take 2,000 Units by mouth daily.  [provider] Taking Active Self  glucose blood (ACCU-CHEK GUIDE) test strip [id number] Yes Use as instructed Tower, Wynelle Fanny, MD Taking Active   hydrochlorothiazide (HYDRODIURIL) 25 MG tablet [id number] Yes TAKE 1 TABLET BY MOUTH  DAILY  Tower, Wynelle Fanny, MD Taking Active   metFORMIN (GLUCOPHAGE) 500 MG tablet [id number] Yes TAKE 1 TABLET BY MOUTH  TWICE DAILY WITH A MEAL Tower, Wynelle Fanny, MD Taking Active   metoprolol tartrate (LOPRESSOR) 100 MG tablet [id number] Yes TAKE 1 TABLET BY MOUTH  TWICE DAILY Tower, Wynelle Fanny, MD Taking Active   olmesartan (BENICAR) 40 MG tablet [id number] Yes TAKE 1 TABLET BY MOUTH  DAILY Tower, Wynelle Fanny, MD Taking Active   potassium chloride SA (KLOR-CON M) 20 MEQ tablet [id number] Yes TAKE 1 TABLET BY MOUTH  DAILY Tower, Wynelle Fanny, MD Taking Active   rosuvastatin (CRESTOR) 20 MG tablet [id number] Yes TAKE 1 TABLET BY MOUTH  DAILY AT [place] Brink, Wynelle Fanny, MD Taking Active             Patient Active Problem List   Diagnosis Date Noted   Dehydration 06/07/2020   Hearing loss 10/22/2019   Tinnitus aurium, right 09/26/2019   Medicare annual wellness visit, subsequent 04/21/2019   Initial Medicare annual wellness visit 06/15/2015   Routine general medical examination at a health care facility 06/15/2015   Estrogen deficiency 06/15/2015   Breast cancer screening 12/17/2013   Other screening mammogram 04/24/2011   Glaucoma 04/24/2011   Vitamin D deficiency 04/24/2011   Anxiety 10/20/2010   HYPOKALEMIA 10/22/2009   Other thalassemia ([place])    Osteoporosis 05/01/2008   Diabetes type 2, controlled ([place]) 07/17/2007   Hyperlipidemia associated with type 2 diabetes mellitus ([place]) 07/17/2007   Anemia 07/17/2007   Essential hypertension 07/17/2007   PREMATURE VENTRICULAR CONTRACTIONS, FREQUENT 07/17/2007   UTI'S, RECURRENT 07/17/2007    Immunization History  Administered Date(s) Administered   Fluad Quad(high Dose 65+) 07/07/2019, 07/11/2021   Influenza, High Dose Seasonal PF 07/05/2018   Influenza,inj,Quad PF,6+ Mos 06/13/2013, 09/11/2014, 06/15/2015, 09/29/2016, 09/11/2017   PFIZER Comirnaty(Gray Top)Covid-19 Tri-Sucrose Vaccine 03/18/2021   PFIZER(Purple Top)SARS-COV-2  Vaccination 10/27/2019, 11/17/2019    Pfizer Covid-19 Vaccine Bivalent Booster 26yr & up 09/19/2021   Pneumococcal Conjugate-13 09/11/2014   Pneumococcal Polysaccharide-23 06/13/2013   Td 09/04/1993, 10/04/2009    Conditions to be addressed/monitored:  Hypertension, Hyperlipidemia, Diabetes, and Osteoporosis  Care Plan : [place] Updates made by FCharlton Haws RPinalsince 05/24/2022 12:00 AM     Problem: Hypertension, Hyperlipidemia, Diabetes, and Osteoporosis   Priority: High     Long-Range Goal: Disease Management   Start Date: 05/10/2021  Expected End Date: 11/29/2022  This Visit's Progress: On track  Recent Progress: On track  Priority: High  Note:   Current Barriers:  Query accuracy of BP monitor  Pharmacist Clinical Goal(s):  Patient will achieve adherence to monitoring guidelines and medication adherence to achieve therapeutic efficacy contact provider office for questions/concerns as evidenced notation of same in electronic health record through collaboration with PharmD and provider.   Interventions: 1:1 collaboration with Tower, MWynelle Fanny MD regarding development and update of comprehensive plan of care as evidenced by provider attestation and co-signature Inter-disciplinary care team collaboration (see longitudinal plan of care) Comprehensive medication review performed; medication list updated in electronic medical record  Hypertension (BP goal <140/90) -Controlled -per home readings; pt is concerned about accuracy of home wrist monitor, she brought it to office today  -Current home BP readings: 110s/50s - 140/70s at home Wrist cuff: 154/103, P 68 Omron: 142/69, P 67 Manual: 144/64, P 68 -Discussed BP monitors - wrist cuffs tends to be less accurate, advised pt to get an arm-cuff automatic monitor -Current treatment: Amlodipine 10 mg daily AM - Appropriate, Effective, Safe, Accessible HCTZ 25 mg daily AM -Appropriate, Effective, Safe, Accessible Potassium 20 mEq  daily AM  -Appropriate, Effective, Safe, Accessible Metoprolol tartrate 100 mg BID -Appropriate, Effective, Safe, Accessible Olmesartan 40 mg daily PM -Appropriate, Effective, Safe, Accessible -Medications previously tried: none  -Educated on BP goals and benefits of medications for prevention of heart attack, stroke and kidney damage; Importance of home blood pressure monitoring; Proper BP monitoring technique; Symptoms of hypotension and importance of maintaining adequate hydration; -Counseled to monitor BP at home 3x weekly  -Recommended to continue current medication; advised pt to contact provider if BP consistently > 150/90 or < 100/50  Hyperlipidemia: (LDL goal < 100) -Controlled - LDL 68 (09/2021), adherence affirmed  -Current treatment: Rosuvastatin 20 mg daily PM -Appropriate, Effective, Safe, Accessible -Medications previously tried: none  -Educated on: importance of daily adherence -Recommended to continue current medication  Diabetes (A1c goal <7%) -Controlled - A1c 6.3% (09/2021) -Home BG: 110-140 typically -Current medications: Metformin 500 mg BID -Appropriate, Effective, Safe, Accessible Testing supplies  -Medications previously tried: none  -Educated on Prevention and management of hypoglycemic episodes -Pt is due for A1c and kidney evaluation; recommend lab eval at next OV -Recommended to continue current medication  Osteoporosis (Goal: prevent fractures) -Controlled - planning 5 years of Fosamax -Last DEXA Scan: 06/22/21   T-Score femoral neck: -3.0  T-Score lumbar spine: -1.2 -Patient is a candidate for pharmacologic treatment due to T-Score < -2.5 in femoral neck -Current treatment  Alendronate 70  mg weekly (started 07/2021) - Appropriate, Effective, Safe, Accessible -Medications previously tried: risedronate (x 5 years)  -Recommend weight-bearing and muscle strengthening exercises for building and maintaining bone density. -Recommended to continue current  medication  Patient Goals/Self-Care Activities Patient will:  - take medications as prescribed as evidenced by patient report and record review focus on medication adherence by pill box  check blood pressure 1-2x weekly, document, and provide at future appointments      Medication Assistance: None required.  Patient affirms current coverage meets needs.  Compliance/Adherence/Medication fill history: Care Gaps: DM kidney eval  A1c (due 04/02/22)  Star-Rating Drugs: Rosuvastatin - PDC 100% Metformin - PDC 100% Olmesartan - PDC 100%  Medication Access: Within the past 30 days, how often has patient missed a dose of medication? 0 Is a pillbox or other method used to improve adherence? Yes  Factors that may affect medication adherence? no barriers identified Are meds synced by current pharmacy? No  Are meds delivered by current pharmacy? Yes  Does patient experience delays in picking up medications due to transportation concerns? No   [facility] Services Reviewed: Is patient disadvantaged to use [facility]?: Yes  Current Rx insurance plan: [employer] Name and location of Current pharmacy:  [facility] - [place], [address] [place] [address] Phone: [phone] Fax: [phone]  [facility] services reviewed with patient today?: No  Patient requests to transfer care to [facility]?: No  Reason patient declined to change pharmacies: Disadvantaged due to insurance/mail order   Care Plan and Follow Up Patient Decision:  Patient agrees to Care Plan and Follow-up.  Plan: Telephone follow up appointment with care management team member scheduled for:  6 months  Charlene Brooke, PharmD, BCACP Clinical Pharmacist [facility] [phone]

## 2022-06-01 ENCOUNTER — Other Ambulatory Visit: Payer: Self-pay | Admitting: Family Medicine

## 2022-07-14 ENCOUNTER — Other Ambulatory Visit: Payer: Self-pay | Admitting: Family Medicine

## 2022-07-14 DIAGNOSIS — Z1231 Encounter for screening mammogram for malignant neoplasm of breast: Secondary | ICD-10-CM

## 2022-07-18 ENCOUNTER — Other Ambulatory Visit: Payer: Self-pay | Admitting: Family Medicine

## 2022-07-19 NOTE — Telephone Encounter (Signed)
LVM for patient to call back. ?

## 2022-07-19 NOTE — Telephone Encounter (Signed)
Patient overdue for appointment.  Please call patient and schedule appointment  for Medicare wellness, CPE with PCP and lab work prior to appointment with PCP. Send back for refill after appointments are scheduled.

## 2022-07-20 ENCOUNTER — Other Ambulatory Visit: Payer: Self-pay | Admitting: Family Medicine

## 2022-07-21 NOTE — Telephone Encounter (Signed)
Patient has been scheduled

## 2022-07-21 NOTE — Telephone Encounter (Signed)
Please call patient and schedule appointments. Patient is overdue. Patient needs Medicare wellness, CPE with PCP and lab work prior to appointment with PCP.

## 2022-08-01 ENCOUNTER — Telehealth: Payer: Self-pay | Admitting: Family Medicine

## 2022-08-01 DIAGNOSIS — I10 Essential (primary) hypertension: Secondary | ICD-10-CM

## 2022-08-01 DIAGNOSIS — E559 Vitamin D deficiency, unspecified: Secondary | ICD-10-CM

## 2022-08-01 DIAGNOSIS — M810 Age-related osteoporosis without current pathological fracture: Secondary | ICD-10-CM

## 2022-08-01 DIAGNOSIS — E119 Type 2 diabetes mellitus without complications: Secondary | ICD-10-CM

## 2022-08-01 DIAGNOSIS — E1169 Type 2 diabetes mellitus with other specified complication: Secondary | ICD-10-CM

## 2022-08-01 NOTE — Telephone Encounter (Signed)
-----   Message from Velna Hatchet, RT sent at 07/21/2022  8:51 AM EST ----- Regarding: Wed 11/29 lab Patient is scheduled for cpx, please order future labs.  Thanks, Anda Kraft

## 2022-08-01 NOTE — Telephone Encounter (Signed)
LVM for pt to rtn my call to schedule AWV with [facility] call back #v [phone]

## 2022-08-02 ENCOUNTER — Other Ambulatory Visit: Payer: Medicare Other

## 2022-08-02 DIAGNOSIS — L03032 Cellulitis of left toe: Secondary | ICD-10-CM | POA: Diagnosis not present

## 2022-08-02 DIAGNOSIS — I739 Peripheral vascular disease, unspecified: Secondary | ICD-10-CM | POA: Diagnosis not present

## 2022-08-03 ENCOUNTER — Telehealth: Payer: Self-pay | Admitting: Family Medicine

## 2022-08-03 NOTE — Telephone Encounter (Signed)
That should be fine  Thanks for letting me know

## 2022-08-03 NOTE — Telephone Encounter (Signed)
Pt.notified

## 2022-08-03 NOTE — Telephone Encounter (Signed)
Patient went to see a podiatrist and was prescribed some Cephalexin 500 MG capsules. She was wanting to make sure she can take those capsules with her other medication. Please advise. Thank you!

## 2022-08-04 ENCOUNTER — Ambulatory Visit (INDEPENDENT_AMBULATORY_CARE_PROVIDER_SITE_OTHER): Payer: Medicare Other

## 2022-08-04 VITALS — Ht 60.0 in | Wt 126.0 lb

## 2022-08-04 DIAGNOSIS — Z0000 Encounter for general adult medical examination without abnormal findings: Secondary | ICD-10-CM | POA: Diagnosis not present

## 2022-08-04 NOTE — Progress Notes (Signed)
Subjective:   Kathy Cunningham is a 86 y.o. female who presents for Medicare Annual (Subsequent) preventive examination.  Review of Systems    No ROS.  Medicare Wellness Virtual Visit.  Visual/audio telehealth visit, UTA vital signs.   See social history for additional risk factors.   Cardiac Risk Factors include: advanced age (>64mn, >>52women)     Objective:    Today's Vitals   08/04/22 0851  Weight: 126 lb (57.2 kg)  Height: 5' (1.524 m)   Body mass index is 24.61 kg/m.     08/04/2022    9:00 AM 01/31/2022    7:37 AM 01/17/2022    8:00 AM 02/01/2021    2:16 AM 06/06/2020   12:34 PM 04/09/2018   11:49 AM 10/11/2016    1:25 PM  Advanced Directives  Does Patient Have a Medical Advance Directive? No No No No No No No  Does patient want to make changes to medical advance directive? No - Patient declined        Would patient like information on creating a medical advance directive?  No - Patient declined Yes (MAU/Ambulatory/Procedural Areas - Information given) No - Patient declined No - Patient declined No - Patient declined     Current Medications (verified) Outpatient Encounter Medications as of 08/04/2022  Medication Sig   rosuvastatin (CRESTOR) 20 MG tablet TAKE 1 TABLET BY MOUTH DAILY AT  NOON   Accu-Chek Softclix Lancets lancets Use as instructed   alendronate (FOSAMAX) 70 MG tablet Take 1 tablet (70 mg total) by mouth every 7 (seven) days. Take with a full glass of water on an empty stomach.   ALPRAZolam (XANAX) 0.5 MG tablet 1/2 to 1 pill by mouth twice daily as needed for anxiety with travel, caution of sedation (Patient taking differently: Take 0.25-0.5 mg by mouth 2 (two) times daily as needed for anxiety. 1/2 to 1 pill by mouth twice daily as needed for anxiety with travel, caution of sedation)   amLODipine (NORVASC) 10 MG tablet TAKE 1 TABLET BY MOUTH DAILY   bimatoprost (LUMIGAN) 0.01 % SOLN Place 1 drop into both eyes at bedtime.   brimonidine (ALPHAGAN P) 0.1 %  SOLN Place 1 drop into both eyes 2 (two) times daily.   Cal Carb-Mag Hydrox-Simeth (ROLAIDS ADVANCED PO) Take 1 tablet by mouth as needed (indigestion).   calcium carbonate (TUMS - DOSED IN MG ELEMENTAL CALCIUM) 500 MG chewable tablet Chew 1 tablet by mouth daily as needed for indigestion or heartburn.   cholecalciferol (VITAMIN D) 1000 UNITS tablet Take 2,000 Units by mouth daily.    glucose blood (ACCU-CHEK GUIDE) test strip Use as instructed   hydrochlorothiazide (HYDRODIURIL) 25 MG tablet TAKE 1 TABLET BY MOUTH  DAILY   metFORMIN (GLUCOPHAGE) 500 MG tablet TAKE 1 TABLET BY MOUTH TWICE  DAILY WITH MEALS   metoprolol tartrate (LOPRESSOR) 100 MG tablet TAKE 1 TABLET BY MOUTH TWICE  DAILY   olmesartan (BENICAR) 40 MG tablet TAKE 1 TABLET BY MOUTH DAILY   potassium chloride SA (KLOR-CON M) 20 MEQ tablet TAKE 1 TABLET BY MOUTH DAILY   No facility-administered encounter medications on file as of 08/04/2022.    Allergies (verified) Atorvastatin and Sulfonamide derivatives   History: Past Medical History:  Diagnosis Date   Anemia    Arthritis    hands   Diabetes mellitus    type II   Dizziness    or vertigo   Glaucoma    Hyperlipidemia    Hypertension  Posterior auricular lymphadenopathy    Right - "comes and goes"   Past Surgical History:  Procedure Laterality Date   bladder papilloma  2006   CATARACT EXTRACTION W/PHACO Left 01/17/2022   Procedure: CATARACT EXTRACTION PHACO AND INTRAOCULAR LENS PLACEMENT ([place]) LEFT DIABETIC RAYNOR LENS;  Surgeon: Birder Robson, MD;  Location: [place];  Service: Ophthalmology;  Laterality: Left;  Diabetic 19.50 02:06.5   CATARACT EXTRACTION W/PHACO Right 01/31/2022   Procedure: CATARACT EXTRACTION PHACO AND INTRAOCULAR LENS PLACEMENT ([place]) RIGHT DIABETIC RAYNOR LENS;  Surgeon: Birder Robson, MD;  Location: [place];  Service: Ophthalmology;  Laterality: Right;  10.55 0:56.4   ROTATOR CUFF REPAIR  01/2002   vaginal  hysterectomy     Family History  Problem Relation Age of Onset   Heart disease Mother        MI   Heart attack Mother    Hypertension Mother    Heart disease Father        MI   Heart attack Father    Hypertension Father    Cancer Brother        pancreatic    Social History   Socioeconomic History   Marital status: Widowed    Spouse name: Not on file   Number of children: Not on file   Years of education: Not on file   Highest education level: Not on file  Occupational History   Not on file  Tobacco Use   Smoking status: Never   Smokeless tobacco: Never  Vaping Use   Vaping Use: Never used  Substance and Sexual Activity   Alcohol use: No    Alcohol/week: 0.0 standard drinks of alcohol   Drug use: No   Sexual activity: Never  Other Topics Concern   Not on file  Social History Narrative   Not on file   Social Determinants of Health   Financial Resource Strain: Low Risk  (08/04/2022)   Overall Financial Resource Strain (CARDIA)    Difficulty of Paying Living Expenses: Not hard at all  Food Insecurity: No Food Insecurity (08/04/2022)   Hunger Vital Sign    Worried About Running Out of Food in the Last Year: Never true    [place] in the Last Year: Never true  Transportation Needs: No Transportation Needs (08/04/2022)   PRAPARE - Hydrologist (Medical): No    Lack of Transportation (Non-Medical): No  Physical Activity: Not on file  Stress: No Stress Concern Present (08/04/2022)   [place]    Feeling of Stress : Not at all  Social Connections: Unknown (08/04/2022)   Social Connection and Isolation Panel [NHANES]    Frequency of Communication with Friends and Family: More than three times a week    Frequency of Social Gatherings with Friends and Family: More than three times a week    Attends Religious Services: Not on Advertising copywriter or Organizations:  Not on file    Attends Archivist Meetings: Not on file    Marital Status: Not on file    Tobacco Counseling Counseling given: Not Answered   Clinical Intake:  Pre-visit preparation completed: Yes        Diabetes: Yes (Followed by PCP)  How often do you need to have someone help you when you read instructions, pamphlets, or other written materials from your doctor or pharmacy?: 1 - Never  Nutrition Risk  Assessment: Has the patient had any N/V/D within the last 2 months?  No  Does the patient have any non-healing wounds?  No  Has the patient had any unintentional weight loss or weight gain?  No   Diabetes: Is the patient diabetic?  Yes  If diabetic, was a CBG obtained today?  No  Did the patient bring in their glucometer from home?  No  How often do you monitor your CBG's? X2 weekly.   Financial Strains and Diabetes Management: Are you having any financial strains with the device, your supplies or your medication? Yes .  Does the patient want to be seen by Chronic Care Management for management of their diabetes?  Yes  Would the patient like to be referred to a Nutritionist or for Diabetic Management?  Yes    Interpreter Needed?: No      Activities of Daily Living    08/04/2022    8:57 AM 01/31/2022    7:33 AM  In your present state of health, do you have any difficulty performing the following activities:  Hearing? 0 0  Vision? 0 0  Difficulty concentrating or making decisions? 0 0  Walking or climbing stairs? 0 0  Dressing or bathing? 0 0  Doing errands, shopping? 0   Preparing Food and eating ? N   Using the Toilet? N   In the past six months, have you accidently leaked urine? N   Do you have problems with loss of bowel control? N   Managing your Medications? N   Managing your Finances? N   Housekeeping or managing your Housekeeping? N     Patient Care Team: Tower, Wynelle Fanny, MD as PCP - General Birder Robson, MD as Referring Physician  (Ophthalmology) Charlton Haws, [facility] as Pharmacist (Pharmacist)  Indicate any recent Medical Services you may have received from other than [facility] providers in the past year (date may be approximate).     Assessment:   This is a routine wellness examination for [place].  I connected with  Kathy Cunningham on 08/04/22 by a audio enabled telemedicine application and verified that I am speaking with the correct person using two identifiers.  Patient Location: Home  Provider Location: Office/Clinic  I discussed the limitations of evaluation and management by telemedicine. The patient expressed understanding and agreed to proceed.   Hearing/Vision screen Hearing Screening - Comments:: Patient is able to hear conversational tones without difficulty.  No issues reported.   Vision Screening - Comments:: Dr. George Ina at [facility] last eye exam May 2023  Dietary issues and exercise activities discussed:     Goals Addressed             This Visit's Progress    Increase physical activity       I will continue to exercise at least 30 min 3 days per week.      Patient Stated   On track    Starting 04/09/18, I will continue to take medications as prescribed.        Depression Screen    08/04/2022    8:54 AM 03/29/2021   11:35 AM 04/21/2019   11:29 AM 04/09/2018   11:12 AM 10/11/2016    1:01 PM 06/15/2015   10:47 AM  PHQ 2/9 Scores  PHQ - 2 Score 1 0 0 2 0 0  PHQ- 9 Score    3      Fall Risk    08/04/2022    8:56 AM 03/29/2021  11:35 AM 04/21/2019   11:30 AM 04/09/2018   11:12 AM 10/11/2016    1:01 PM  Fall Risk   Falls in the past year? 0 0 0 No Yes  Comment     ER treatment sought after fall in parking lot  Number falls in past yr: 0  0  1  Injury with Fall? 0  0  Yes  Follow up Falls evaluation completed;Falls prevention discussed;Education provided Falls evaluation completed Falls evaluation completed      FALL RISK PREVENTION PERTAINING TO THE HOME: Home free  of loose throw rugs in walkways, pet beds, electrical cords, etc? Yes Adequate lighting in your home to reduce risk of falls? Yes   ASSISTIVE DEVICES UTILIZED TO PREVENT FALLS: Life alert? No  Use of a cane, walker or w/c? No  Grab bars in the bathroom? No Shower chair or bench in shower? No  Elevated toilet seat or a handicapped toilet? No   TIMED UP AND GO: Was the test performed? No .   Cognitive Function:    04/09/2018   11:49 AM 10/11/2016    1:01 PM  MMSE - Mini Mental State Exam  Orientation to time 5 5  Orientation to Place 5 5  Registration 3 3  Attention/ Calculation 0 0  Recall 3 2  Recall-comments  pt was unable to recall 1 of 3 words  Language- name 2 objects 0 0  Language- repeat 1 1  Language- follow 3 step command 3 3  Language- read & follow direction 0 0  Write a sentence 0 0  Copy design 0 0  Total score 20 19        08/04/2022    8:57 AM  6CIT Screen  What Year? 0 points  What month? 0 points  What time? 0 points  Count back from 20 2 points  Months in reverse 2 points  Repeat phrase 0 points  Total Score 4 points    Immunizations Immunization History  Administered Date(s) Administered   Fluad Quad(high Dose 65+) 07/07/2019, 07/11/2021   Influenza, High Dose Seasonal PF 07/05/2018   Influenza,inj,Quad PF,6+ Mos 06/13/2013, 09/11/2014, 06/15/2015, 09/29/2016, 09/11/2017   PFIZER Comirnaty(Gray Top)Covid-19 Tri-Sucrose Vaccine 03/18/2021   PFIZER(Purple Top)SARS-COV-2 Vaccination 10/27/2019, 11/17/2019   Pfizer Covid-19 Vaccine Bivalent Booster 69yr & up 09/19/2021   Pneumococcal Conjugate-13 09/11/2014   Pneumococcal Polysaccharide-23 06/13/2013   Td 09/04/1993, 10/04/2009    TDAP status: Due, Education has been provided regarding the importance of this vaccine. Advised may receive this vaccine at local pharmacy or Health Dept. Aware to provide a copy of the vaccination record if obtained from local pharmacy or Health Dept. Verbalized  acceptance and understanding.  Flu Vaccine status: Due, Education has been provided regarding the importance of this vaccine. Advised may receive this vaccine at local pharmacy or Health Dept. Aware to provide a copy of the vaccination record if obtained from local pharmacy or Health Dept. Verbalized acceptance and understanding.  Covid-19 vaccine status: Completed vaccines x4.  Screening Tests Health Maintenance  Topic Date Due   Diabetic kidney evaluation - Urine ACR  10/04/2010   HEMOGLOBIN A1C  04/02/2022   MAMMOGRAM  06/03/2022   COVID-19 Vaccine (5 - 2023-24 season) 08/20/2022 (Originally 05/05/2022)   Zoster Vaccines- Shingrix (1 of 2) 10/03/2022 (Originally 11/10/1987)   INFLUENZA VACCINE  12/03/2022 (Originally 04/04/2022)   OPHTHALMOLOGY EXAM  08/26/2022   Diabetic kidney evaluation - GFR measurement  10/03/2022   FOOT EXAM  10/03/2022   Medicare  Annual Wellness (AWV)  08/05/2023   Pneumonia Vaccine 40+ Years old  Completed   DEXA SCAN  Completed   HPV VACCINES  Aged Out   DTaP/Tdap/Td  Discontinued   Health Maintenance Health Maintenance Due  Topic Date Due   Diabetic kidney evaluation - Urine ACR  10/04/2010   HEMOGLOBIN A1C  04/02/2022   MAMMOGRAM  06/03/2022   Lung Cancer Screening: (Low Dose CT Chest recommended if Age 48-80 years, 30 pack-year currently smoking OR have quit w/in 15years.) does not qualify.   Hepatitis C Screening: does not qualify  Vision Screening: Recommended annual ophthalmology exams for early detection of glaucoma and other disorders of the eye.  Dental Screening: Recommended annual dental exams for proper oral hygiene. Partial in use.   Community Resource Referral / Chronic Care Management: CRR required this visit?  No   CCM required this visit?  No      Plan:     I have personally reviewed and noted the following in the patient's chart:   Medical and social history Use of alcohol, tobacco or illicit drugs  Current medications and  supplements including opioid prescriptions. Patient is not currently taking opioid prescriptions. Functional ability and status Nutritional status Physical activity Advanced directives List of other physicians Hospitalizations, surgeries, and ER visits in previous 12 months Vitals Screenings to include cognitive, depression, and falls Referrals and appointments  In addition, I have reviewed and discussed with patient certain preventive protocols, quality metrics, and best practice recommendations. A written personalized care plan for preventive services as well as general preventive health recommendations were provided to patient.     Leta Jungling, LPN   19/11/7900

## 2022-08-04 NOTE — Patient Instructions (Addendum)
Kathy Cunningham , Thank you for taking time to come for your Medicare Wellness Visit. I appreciate your ongoing commitment to your health goals. Please review the following plan we discussed and let me know if I can assist you in the future.   These are the goals we discussed:  Goals      Increase physical activity     I will continue to exercise at least 30 min 3 days per week.      Patient Stated     Starting 04/09/18, I will continue to take medications as prescribed.         This is a list of the screening recommended for you and due dates:  Health Maintenance  Topic Date Due   Yearly kidney health urinalysis for diabetes  10/04/2010   Hemoglobin A1C  04/02/2022   Mammogram  06/03/2022   COVID-19 Vaccine (5 - 2023-24 season) 08/20/2022*   Zoster (Shingles) Vaccine (1 of 2) 10/03/2022*   Flu Shot  12/03/2022*   Eye exam for diabetics  08/26/2022   Yearly kidney function blood test for diabetes  10/03/2022   Complete foot exam   10/03/2022   Medicare Annual Wellness Visit  08/05/2023   Pneumonia Vaccine  Completed   DEXA scan (bone density measurement)  Completed   HPV Vaccine  Aged Out   DTaP/Tdap/Td vaccine  Discontinued  *Topic was postponed. The date shown is not the original due date.    Advanced directives: End of life planning; Advanced aging; Advanced directives discussed.  No HCPOA/Living Will.  She has the paperwork at home and agrees to update record upon completion.   Conditions/risks identified: none new  Next appointment: Follow up in one year for your annual wellness visit    Preventive Care 65 Years and Older, Female Preventive care refers to lifestyle choices and visits with your health care provider that can promote health and wellness. What does preventive care include? A yearly physical exam. This is also called an annual well check. Dental exams once or twice a year. Routine eye exams. Ask your health care provider how often you should have your eyes  checked. Personal lifestyle choices, including: Daily care of your teeth and gums. Regular physical activity. Eating a healthy diet. Avoiding tobacco and drug use. Limiting alcohol use. Practicing safe sex. Taking low-dose aspirin every day. Taking vitamin and mineral supplements as recommended by your health care provider. What happens during an annual well check? The services and screenings done by your health care provider during your annual well check will depend on your age, overall health, lifestyle risk factors, and family history of disease. Counseling  Your health care provider may ask you questions about your: Alcohol use. Tobacco use. Drug use. Emotional well-being. Home and relationship well-being. Sexual activity. Eating habits. History of falls. Memory and ability to understand (cognition). Work and work Statistician. Reproductive health. Screening  You may have the following tests or measurements: Height, weight, and BMI. Blood pressure. Lipid and cholesterol levels. These may be checked every 5 years, or more frequently if you are over 94 years old. Skin check. Lung cancer screening. You may have this screening every year starting at age 21 if you have a 30-pack-year history of smoking and currently smoke or have quit within the past 15 years. Fecal occult blood test (FOBT) of the stool. You may have this test every year starting at age 37. Flexible sigmoidoscopy or colonoscopy. You may have a sigmoidoscopy every 5 years or a  colonoscopy every 10 years starting at age 84. Hepatitis C blood test. Hepatitis B blood test. Sexually transmitted disease (STD) testing. Diabetes screening. This is done by checking your blood sugar (glucose) after you have not eaten for a while (fasting). You may have this done every 1-3 years. Bone density scan. This is done to screen for osteoporosis. You may have this done starting at age 2. Mammogram. This may be done every 1-2  years. Talk to your health care provider about how often you should have regular mammograms. Talk with your health care provider about your test results, treatment options, and if necessary, the need for more tests. Vaccines  Your health care provider may recommend certain vaccines, such as: Influenza vaccine. This is recommended every year. Tetanus, diphtheria, and acellular pertussis (Tdap, Td) vaccine. You may need a Td booster every 10 years. Zoster vaccine. You may need this after age 66. Pneumococcal 13-valent conjugate (PCV13) vaccine. One dose is recommended after age 62. Pneumococcal polysaccharide (PPSV23) vaccine. One dose is recommended after age 74. Talk to your health care provider about which screenings and vaccines you need and how often you need them. This information is not intended to replace advice given to you by your health care provider. Make sure you discuss any questions you have with your health care provider. Document Released: 09/17/2015 Document Revised: 05/10/2016 Document Reviewed: 06/22/2015 Elsevier Interactive Patient Education  2017 [place] Prevention in the Home Falls can cause injuries. They can happen to people of all ages. There are many things you can do to make your home safe and to help prevent falls. What can I do on the outside of my home? Regularly fix the edges of walkways and driveways and fix any cracks. Remove anything that might make you trip as you walk through a door, such as a raised step or threshold. Trim any bushes or trees on the path to your home. Use bright outdoor lighting. Clear any walking paths of anything that might make someone trip, such as rocks or tools. Regularly check to see if handrails are loose or broken. Make sure that both sides of any steps have handrails. Any raised decks and porches should have guardrails on the edges. Have any leaves, snow, or ice cleared regularly. Use sand or salt on walking paths  during winter. Clean up any spills in your garage right away. This includes oil or grease spills. What can I do in the bathroom? Use night lights. Install grab bars by the toilet and in the tub and shower. Do not use towel bars as grab bars. Use non-skid mats or decals in the tub or shower. If you need to sit down in the shower, use a plastic, non-slip stool. Keep the floor dry. Clean up any water that spills on the floor as soon as it happens. Remove soap buildup in the tub or shower regularly. Attach bath mats securely with double-sided non-slip rug tape. Do not have throw rugs and other things on the floor that can make you trip. What can I do in the bedroom? Use night lights. Make sure that you have a light by your bed that is easy to reach. Do not use any sheets or blankets that are too big for your bed. They should not hang down onto the floor. Have a firm chair that has side arms. You can use this for support while you get dressed. Do not have throw rugs and other things on the floor that can  make you trip. What can I do in the kitchen? Clean up any spills right away. Avoid walking on wet floors. Keep items that you use a lot in easy-to-reach places. If you need to reach something above you, use a strong step stool that has a grab bar. Keep electrical cords out of the way. Do not use floor polish or wax that makes floors slippery. If you must use wax, use non-skid floor wax. Do not have throw rugs and other things on the floor that can make you trip. What can I do with my stairs? Do not leave any items on the stairs. Make sure that there are handrails on both sides of the stairs and use them. Fix handrails that are broken or loose. Make sure that handrails are as long as the stairways. Check any carpeting to make sure that it is firmly attached to the stairs. Fix any carpet that is loose or worn. Avoid having throw rugs at the top or bottom of the stairs. If you do have throw  rugs, attach them to the floor with carpet tape. Make sure that you have a light switch at the top of the stairs and the bottom of the stairs. If you do not have them, ask someone to add them for you. What else can I do to help prevent falls? Wear shoes that: Do not have high heels. Have rubber bottoms. Are comfortable and fit you well. Are closed at the toe. Do not wear sandals. If you use a stepladder: Make sure that it is fully opened. Do not climb a closed stepladder. Make sure that both sides of the stepladder are locked into place. Ask someone to hold it for you, if possible. Clearly mark and make sure that you can see: Any grab bars or handrails. First and last steps. Where the edge of each step is. Use tools that help you move around (mobility aids) if they are needed. These include: Canes. Walkers. Scooters. Crutches. Turn on the lights when you go into a dark area. Replace any light bulbs as soon as they burn out. Set up your furniture so you have a clear path. Avoid moving your furniture around. If any of your floors are uneven, fix them. If there are any pets around you, be aware of where they are. Review your medicines with your doctor. Some medicines can make you feel dizzy. This can increase your chance of falling. Ask your doctor what other things that you can do to help prevent falls. This information is not intended to replace advice given to you by your health care provider. Make sure you discuss any questions you have with your health care provider. Document Released: 06/17/2009 Document Revised: 01/27/2016 Document Reviewed: 09/25/2014 Elsevier Interactive Patient Education  2017 [employer].

## 2022-08-07 ENCOUNTER — Other Ambulatory Visit (INDEPENDENT_AMBULATORY_CARE_PROVIDER_SITE_OTHER): Payer: Medicare Other

## 2022-08-07 ENCOUNTER — Telehealth: Payer: Self-pay

## 2022-08-07 DIAGNOSIS — I10 Essential (primary) hypertension: Secondary | ICD-10-CM

## 2022-08-07 DIAGNOSIS — E1169 Type 2 diabetes mellitus with other specified complication: Secondary | ICD-10-CM | POA: Diagnosis not present

## 2022-08-07 DIAGNOSIS — E785 Hyperlipidemia, unspecified: Secondary | ICD-10-CM

## 2022-08-07 DIAGNOSIS — E559 Vitamin D deficiency, unspecified: Secondary | ICD-10-CM

## 2022-08-07 DIAGNOSIS — E119 Type 2 diabetes mellitus without complications: Secondary | ICD-10-CM | POA: Diagnosis not present

## 2022-08-07 LAB — CBC WITH DIFFERENTIAL/PLATELET
Basophils Absolute: 0 10*3/uL (ref 0.0–0.1)
Basophils Relative: 1.2 % (ref 0.0–3.0)
Eosinophils Absolute: 0.1 10*3/uL (ref 0.0–0.7)
Eosinophils Relative: 3.6 % (ref 0.0–5.0)
HCT: 31.2 % — ABNORMAL LOW (ref 36.0–46.0)
Hemoglobin: 10.5 g/dL — ABNORMAL LOW (ref 12.0–15.0)
Lymphocytes Relative: 35.1 % (ref 12.0–46.0)
Lymphs Abs: 1.4 10*3/uL (ref 0.7–4.0)
MCHC: 33.7 g/dL (ref 30.0–36.0)
MCV: 94.1 fl (ref 78.0–100.0)
Monocytes Absolute: 0.4 10*3/uL (ref 0.1–1.0)
Monocytes Relative: 10.7 % (ref 3.0–12.0)
Neutro Abs: 2 10*3/uL (ref 1.4–7.7)
Neutrophils Relative %: 49.4 % (ref 43.0–77.0)
Platelets: 252 10*3/uL (ref 150.0–400.0)
RBC: 3.32 Mil/uL — ABNORMAL LOW (ref 3.87–5.11)
RDW: 14 % (ref 11.5–15.5)
WBC: 4 10*3/uL (ref 4.0–10.5)

## 2022-08-07 LAB — COMPREHENSIVE METABOLIC PANEL
ALT: 8 U/L (ref 0–35)
AST: 18 U/L (ref 0–37)
Albumin: 4.4 g/dL (ref 3.5–5.2)
Alkaline Phosphatase: 51 U/L (ref 39–117)
BUN: 14 mg/dL (ref 6–23)
CO2: 30 mEq/L (ref 19–32)
Calcium: 9.4 mg/dL (ref 8.4–10.5)
Chloride: 104 mEq/L (ref 96–112)
Creatinine, Ser: 1.03 mg/dL (ref 0.40–1.20)
GFR: 49.85 mL/min — ABNORMAL LOW (ref >60.00)
Glucose, Bld: 105 mg/dL — ABNORMAL HIGH (ref 70–99)
Potassium: 4 mEq/L (ref 3.5–5.1)
Sodium: 141 mEq/L (ref 135–145)
Total Bilirubin: 0.5 mg/dL (ref 0.2–1.2)
Total Protein: 6.9 g/dL (ref 6.0–8.3)

## 2022-08-07 LAB — LIPID PANEL
Cholesterol: 162 mg/dL (ref 0–200)
HDL: 73.6 mg/dL (ref >39.00)
LDL Cholesterol: 76 mg/dL (ref 0–99)
NonHDL: 88.59
Total CHOL/HDL Ratio: 2
Triglycerides: 63 mg/dL (ref 0.0–149.0)
VLDL: 12.6 mg/dL (ref 0.0–40.0)

## 2022-08-07 LAB — MICROALBUMIN / CREATININE URINE RATIO
Creatinine,U: 42.9 mg/dL
Microalb Creat Ratio: 31.1 mg/g — ABNORMAL HIGH (ref 0.0–30.0)
Microalb, Ur: 13.3 mg/dL — ABNORMAL HIGH (ref 0.0–1.9)

## 2022-08-07 LAB — VITAMIN D 25 HYDROXY (VIT D DEFICIENCY, FRACTURES): VITD: 44.41 ng/mL (ref 30.00–100.00)

## 2022-08-07 LAB — TSH: TSH: 1.46 u[IU]/mL (ref 0.35–5.50)

## 2022-08-07 LAB — HEMOGLOBIN A1C: Hgb A1c MFr Bld: 6.3 % (ref 4.6–6.5)

## 2022-08-07 NOTE — Progress Notes (Signed)
Entered in Error

## 2022-08-08 ENCOUNTER — Encounter: Payer: Self-pay | Admitting: Family Medicine

## 2022-08-08 ENCOUNTER — Ambulatory Visit (INDEPENDENT_AMBULATORY_CARE_PROVIDER_SITE_OTHER): Payer: Medicare Other | Admitting: Family Medicine

## 2022-08-08 VITALS — BP 144/80 | HR 70 | Temp 97.7°F | Ht 59.5 in | Wt 123.1 lb

## 2022-08-08 DIAGNOSIS — E559 Vitamin D deficiency, unspecified: Secondary | ICD-10-CM | POA: Diagnosis not present

## 2022-08-08 DIAGNOSIS — I10 Essential (primary) hypertension: Secondary | ICD-10-CM | POA: Diagnosis not present

## 2022-08-08 DIAGNOSIS — Z0000 Encounter for general adult medical examination without abnormal findings: Secondary | ICD-10-CM | POA: Diagnosis not present

## 2022-08-08 DIAGNOSIS — M810 Age-related osteoporosis without current pathological fracture: Secondary | ICD-10-CM | POA: Diagnosis not present

## 2022-08-08 DIAGNOSIS — E785 Hyperlipidemia, unspecified: Secondary | ICD-10-CM

## 2022-08-08 DIAGNOSIS — E119 Type 2 diabetes mellitus without complications: Secondary | ICD-10-CM | POA: Diagnosis not present

## 2022-08-08 DIAGNOSIS — Z23 Encounter for immunization: Secondary | ICD-10-CM | POA: Diagnosis not present

## 2022-08-08 DIAGNOSIS — D649 Anemia, unspecified: Secondary | ICD-10-CM | POA: Diagnosis not present

## 2022-08-08 DIAGNOSIS — E1169 Type 2 diabetes mellitus with other specified complication: Secondary | ICD-10-CM | POA: Diagnosis not present

## 2022-08-08 NOTE — Assessment & Plan Note (Signed)
Reviewed health habits including diet and exercise and skin cancer prevention Reviewed appropriate screening tests for age  Also reviewed health mt list, fam hx and immunization status , as well as social and family history   See HPI Labs reviewed  Flu shot given Pt plans to check on avail of shingrix vac in pharm Mammogram scheduled tomorrow Dexa utd 06/2021  No falls or fx

## 2022-08-08 NOTE — Assessment & Plan Note (Signed)
Disc goals for lipids and reasons to control them Rev last labs with pt Rev low sat fat diet in detail Fair control with crestor 20 mg daily  LDL 76 with goal of 72 Will work on diet

## 2022-08-08 NOTE — Patient Instructions (Addendum)
You can get your shingles shot in about a month since you have flu shot today If you are interested in the new shingles vaccine (Shingrix) - call your local pharmacy to check on coverage and availability  If affordable, get on a wait list at your pharmacy to get the vaccine.  Flu shot today   Go ahead and start back on the fosamax  Hold this only if you have a big dental surgery  Do see your dentist about the sores/blisters in your mouth  Please drink water for kidney health  Aim for 64 oz of fluids daily (mostly water)   Get your yearly diabetic eye exam this month as planned   Get your mammogram as planned tomorrow   Keep taking care of yourself

## 2022-08-08 NOTE — Assessment & Plan Note (Signed)
bp in fair control at this time  BP Readings from Last 1 Encounters:  08/08/22 (!) 144/80   No changes needed Most recent labs reviewed  Disc lifstyle change with low sodium diet and exercise  Plan to continue Amlodipine 10 mg daily  Hctz 25 mg daily  Metoprolol 100 mg bid Olmesartan 40 mg daily

## 2022-08-08 NOTE — Progress Notes (Signed)
Subjective:    Patient ID: Kathy Cunningham, female    DOB: Jan 22, 1936, 86 y.o.   MRN: [id number]  HPI Here for health maintenance exam and to review chronic medical problems    Wt Readings from Last 3 Encounters:  08/08/22 123 lb 2 oz (55.8 kg)  08/04/22 126 lb (57.2 kg)  01/31/22 126 lb (57.2 kg)   24.45 kg/m  Feels ok  A lot of cooking for the holidays- used to enjoy that/not as much now   Immunization History  Administered Date(s) Administered   Fluad Quad(high Dose 65+) 07/07/2019, 07/11/2021   Influenza, High Dose Seasonal PF 07/05/2018   Influenza,inj,Quad PF,6+ Mos 06/13/2013, 09/11/2014, 06/15/2015, 09/29/2016, 09/11/2017   PFIZER Comirnaty(Gray Top)Covid-19 Tri-Sucrose Vaccine 03/18/2021   PFIZER(Purple Top)SARS-COV-2 Vaccination 10/27/2019, 11/17/2019   Pfizer Covid-19 Vaccine Bivalent Booster 11yr & up 09/19/2021   Pneumococcal Conjugate-13 09/11/2014   Pneumococcal Polysaccharide-23 06/13/2013   Td 09/04/1993, 10/04/2009   Health Maintenance Due  Topic Date Due   MAMMOGRAM  06/03/2022    Flu shot- today   [place] [facility], this is scheduled tomorrow  Self breast exam: no lumps   Dexa  06/2021 - OP / dec bmd  Taking alendronate for a year-holding it for some dental problems  Falls : none Fractures: none  Supplements -vit D . Sometimes calcium  D level is 44.4   Exercise : walking   Eats a fairly balanced diet  Intol of dairy  Eats spinach   HTN bp is stable today  No cp or palpitations or headaches or edema  No side effects to medicines  BP Readings from Last 3 Encounters:  08/08/22 (!) 144/80  01/31/22 (!) 116/48  01/17/22 (!) 99/50    Amlodipine 10 mg daily  Hctz 25 mg daily  Metoprolol 100 mg bid Olmesartan 40 mg daily     Lab Results  Component Value Date   CREATININE 1.03 08/07/2022   BUN 14 08/07/2022   NA 141 08/07/2022   K 4.0 08/07/2022   CL 104 08/07/2022   CO2 30  08/07/2022   GFR 49.8   DM2 Lab Results  Component Value Date   HGBA1C 6.3 08/07/2022   Well controlled  On arb and statin   Metformin 500 -- mg bid      Lab Results  Component Value Date   MICROALBUR 13.3 (H) 08/07/2022   MICROALBUR 4.8 (H) 10/04/2009   Hyperlipidemia Lab Results  Component Value Date   CHOL 162 08/07/2022   CHOL 160 10/03/2021   CHOL 215 (H) 03/29/2021   Lab Results  Component Value Date   HDL 73.60 08/07/2022   HDL 73.40 10/03/2021   HDL 78.20 03/29/2021   Lab Results  Component Value Date   LDLCALC 76 08/07/2022   LDLCALC 68 10/03/2021   LDLCALC 119 (H) 03/29/2021   Lab Results  Component Value Date   TRIG 63.0 08/07/2022   TRIG 93.0 10/03/2021   TRIG 90.0 03/29/2021   Lab Results  Component Value Date   CHOLHDL 2 08/07/2022   CHOLHDL 2 10/03/2021   CHOLHDL 3 03/29/2021   No results found for: "LDLDIRECT"  Crestor 20 mg daily  No sugar or fatty foods    Anemia  Lab Results  Component Value Date   WBC 4.0 08/07/2022   HGB 10.5 (L) 08/07/2022   HCT 31.2 (L) 08/07/2022   MCV 94.1 08/07/2022   PLT 252.0 08/07/2022   Thalassemia  Patient Active Problem List   Diagnosis Date Noted   Dehydration 06/07/2020   Hearing loss 10/22/2019   Tinnitus aurium, right 09/26/2019   Medicare annual wellness visit, subsequent 04/21/2019   Initial Medicare annual wellness visit 06/15/2015   Routine general medical examination at a health care facility 06/15/2015   Estrogen deficiency 06/15/2015   Breast cancer screening 12/17/2013   Encounter for screening mammogram for breast cancer 04/24/2011   Glaucoma 04/24/2011   Vitamin D deficiency 04/24/2011   Anxiety 10/20/2010   HYPOKALEMIA 10/22/2009   Other thalassemia ([place])    Osteoporosis 05/01/2008   Diabetes type 2, controlled ([place]) 07/17/2007   Hyperlipidemia associated with type 2 diabetes mellitus ([place]) 07/17/2007   Anemia 07/17/2007   Essential hypertension 07/17/2007    PREMATURE VENTRICULAR CONTRACTIONS, FREQUENT 07/17/2007   UTI'S, RECURRENT 07/17/2007   Past Medical History:  Diagnosis Date   Anemia    Arthritis    hands   Diabetes mellitus    type II   Dizziness    or vertigo   Glaucoma    Hyperlipidemia    Hypertension    Posterior auricular lymphadenopathy    Right - "comes and goes"   Past Surgical History:  Procedure Laterality Date   bladder papilloma  2006   CATARACT EXTRACTION W/PHACO Left 01/17/2022   Procedure: CATARACT EXTRACTION PHACO AND INTRAOCULAR LENS PLACEMENT ([place]) LEFT DIABETIC RAYNOR LENS;  Surgeon: Birder Robson, MD;  Location: [facility];  Service: Ophthalmology;  Laterality: Left;  Diabetic 19.50 02:06.5   CATARACT EXTRACTION W/PHACO Right 01/31/2022   Procedure: CATARACT EXTRACTION PHACO AND INTRAOCULAR LENS PLACEMENT ([place]) RIGHT DIABETIC RAYNOR LENS;  Surgeon: Birder Robson, MD;  Location: [facility];  Service: Ophthalmology;  Laterality: Right;  10.55 0:56.4   ROTATOR CUFF REPAIR  01/2002   vaginal hysterectomy     Social History   Tobacco Use   Smoking status: Never   Smokeless tobacco: Never  Vaping Use   Vaping Use: Never used  Substance Use Topics   Alcohol use: No    Alcohol/week: 0.0 standard drinks of alcohol   Drug use: No   Family History  Problem Relation Age of Onset   Heart disease Mother        MI   Heart attack Mother    Hypertension Mother    Heart disease Father        MI   Heart attack Father    Hypertension Father    Cancer Brother        pancreatic    Allergies  Allergen Reactions   Atorvastatin Other (See Comments)    REACTION: cramps   Sulfonamide Derivatives Rash   Current Outpatient Medications on File Prior to Visit  Medication Sig Dispense Refill   Accu-Chek Softclix Lancets lancets Use as instructed 100 each 3   alendronate (FOSAMAX) 70 MG tablet Take 1 tablet (70 mg total) by mouth every 7 (seven) days. Take with a full glass of water on  an empty stomach. 12 tablet 3   ALPRAZolam (XANAX) 0.5 MG tablet 1/2 to 1 pill by mouth twice daily as needed for anxiety with travel, caution of sedation 10 tablet 0   amLODipine (NORVASC) 10 MG tablet TAKE 1 TABLET BY MOUTH DAILY 100 tablet 2   bimatoprost (LUMIGAN) 0.01 % SOLN Place 1 drop into both eyes at bedtime.     brimonidine (ALPHAGAN P) 0.1 % SOLN Place 1 drop into both eyes 2 (two) times daily.  Cal Carb-Mag Hydrox-Simeth (ROLAIDS ADVANCED PO) Take 1 tablet by mouth as needed (indigestion).     calcium carbonate (TUMS - DOSED IN MG ELEMENTAL CALCIUM) 500 MG chewable tablet Chew 1 tablet by mouth daily as needed for indigestion or heartburn.     cephALEXin (KEFLEX) 500 MG capsule Take 500 mg by mouth 3 (three) times daily.     cholecalciferol (VITAMIN D) 1000 UNITS tablet Take 2,000 Units by mouth daily.      glucose blood (ACCU-CHEK GUIDE) test strip Use as instructed 100 each 3   hydrochlorothiazide (HYDRODIURIL) 25 MG tablet TAKE 1 TABLET BY MOUTH  DAILY 90 tablet 1   metFORMIN (GLUCOPHAGE) 500 MG tablet TAKE 1 TABLET BY MOUTH TWICE  DAILY WITH MEALS 180 tablet 0   metoprolol tartrate (LOPRESSOR) 100 MG tablet TAKE 1 TABLET BY MOUTH TWICE  DAILY 180 tablet 0   olmesartan (BENICAR) 40 MG tablet TAKE 1 TABLET BY MOUTH DAILY 90 tablet 0   potassium chloride SA (KLOR-CON M) 20 MEQ tablet TAKE 1 TABLET BY MOUTH DAILY 90 tablet 0   rosuvastatin (CRESTOR) 20 MG tablet TAKE 1 TABLET BY MOUTH DAILY AT  NOON 90 tablet 0   No current facility-administered medications on file prior to visit.    Review of Systems  Constitutional:  Negative for activity change, appetite change, fatigue, fever and unexpected weight change.  HENT:  Negative for congestion, ear pain, rhinorrhea, sinus pressure and sore throat.        Some sores in mouth from mal fitting partial  Eyes:  Negative for pain, redness and visual disturbance.  Respiratory:  Negative for cough, shortness of breath and wheezing.    Cardiovascular:  Negative for chest pain and palpitations.  Gastrointestinal:  Negative for abdominal pain, blood in stool, constipation and diarrhea.  Endocrine: Negative for polydipsia and polyuria.  Genitourinary:  Negative for dysuria, frequency and urgency.  Musculoskeletal:  Positive for arthralgias. Negative for back pain and myalgias.  Skin:  Negative for pallor and rash.  Allergic/Immunologic: Negative for environmental allergies.  Neurological:  Negative for dizziness, syncope and headaches.  Hematological:  Negative for adenopathy. Does not bruise/bleed easily.  Psychiatric/Behavioral:  Negative for decreased concentration and dysphoric mood. The patient is not nervous/anxious.        Objective:   Physical Exam Constitutional:      General: She is not in acute distress.    Appearance: Normal appearance. She is well-developed and normal weight. She is not ill-appearing or diaphoretic.  HENT:     Head: Normocephalic and atraumatic.     Right Ear: Tympanic membrane, ear canal and external ear normal.     Left Ear: Tympanic membrane, ear canal and external ear normal.     Nose: Nose normal. No congestion.     Mouth/Throat:     Mouth: Mucous membranes are moist.     Pharynx: Oropharynx is clear. No posterior oropharyngeal erythema.  Eyes:     General: No scleral icterus.    Extraocular Movements: Extraocular movements intact.     Conjunctiva/sclera: Conjunctivae normal.     Pupils: Pupils are equal, round, and reactive to light.  Neck:     Thyroid: No thyromegaly.     Vascular: No carotid bruit or JVD.  Cardiovascular:     Rate and Rhythm: Normal rate and regular rhythm.     Pulses: Normal pulses.     Heart sounds: Normal heart sounds.     No gallop.  Pulmonary:  Effort: Pulmonary effort is normal. No respiratory distress.     Breath sounds: Normal breath sounds. No wheezing.     Comments: Good air exch Chest:     Chest wall: No tenderness.  Abdominal:      General: Bowel sounds are normal. There is no distension or abdominal bruit.     Palpations: Abdomen is soft. There is no mass.     Tenderness: There is no abdominal tenderness.     Hernia: No hernia is present.  Genitourinary:    Comments: Breast exam: No mass, nodules, thickening, tenderness, bulging, retraction, inflamation, nipple discharge or skin changes noted.  No axillary or clavicular LA.     Musculoskeletal:        General: No tenderness. Normal range of motion.     Cervical back: Normal range of motion and neck supple. No rigidity. No muscular tenderness.     Right lower leg: No edema.     Left lower leg: No edema.     Comments: No kyphosis   Lymphadenopathy:     Cervical: No cervical adenopathy.  Skin:    General: Skin is warm and dry.     Coloration: Skin is not pale.     Findings: No erythema or rash.     Comments: Some scattered lentigines  Neurological:     Mental Status: She is alert. Mental status is at baseline.     Cranial Nerves: No cranial nerve deficit.     Motor: No abnormal muscle tone.     Coordination: Coordination normal.     Gait: Gait normal.     Deep Tendon Reflexes: Reflexes are normal and symmetric. Reflexes normal.  Psychiatric:        Mood and Affect: Mood normal.        Cognition and Memory: Cognition and memory normal.           Assessment & Plan:   Problem List Items Addressed This Visit       Cardiovascular and Mediastinum   Essential hypertension    bp in fair control at this time  BP Readings from Last 1 Encounters:  08/08/22 (!) 144/80  No changes needed Most recent labs reviewed  Disc lifstyle change with low sodium diet and exercise  Plan to continue Amlodipine 10 mg daily  Hctz 25 mg daily  Metoprolol 100 mg bid Olmesartan 40 mg daily         Endocrine   Diabetes type 2, controlled ([place])    Lab Results  Component Value Date   HGBA1C 6.3 08/07/2022  Stable Good habits Plan to continue metformin 500 mg  bid Noted microalb  On arb  On statin  Nl foot exam  F/u 6 mo      Hyperlipidemia associated with type 2 diabetes mellitus ([place])    Disc goals for lipids and reasons to control them Rev last labs with pt Rev low sat fat diet in detail Fair control with crestor 20 mg daily  LDL 76 with goal of 70 Will work on diet         Musculoskeletal and Integument   Osteoporosis    Dexa utd 06/2021  OP  On alendronate-enc her to re start this (hold for invasive dental tx) No falls or fx Enc ca and D D level is tx   Disc need for calcium/ vitamin D/ wt bearing exercise and bone density test every 2 y to monitor Disc safety/ fracture risk in detail  Other   Anemia    Fairly stable for thalassemia  Hb 10.5 No clinical changes       Routine general medical examination at a health care facility    Reviewed health habits including diet and exercise and skin cancer prevention Reviewed appropriate screening tests for age  Also reviewed health mt list, fam hx and immunization status , as well as social and family history   See HPI Labs reviewed  Flu shot given Pt plans to check on avail of shingrix vac in pharm Mammogram scheduled tomorrow Dexa utd 06/2021  No falls or fx        Vitamin D deficiency    Vitamin D level is therapeutic with current supplementation Disc importance of this to bone and overall health  Level of 44.4       Other Visit Diagnoses     Need for influenza vaccination    -  Primary   Relevant Orders   Flu Vaccine QUAD High Dose(Fluad) (Completed)

## 2022-08-08 NOTE — Assessment & Plan Note (Signed)
Fairly stable for thalassemia  Hb 10.5 No clinical changes

## 2022-08-08 NOTE — Assessment & Plan Note (Signed)
Dexa utd 06/2021  OP  On alendronate-enc her to re start this (hold for invasive dental tx) No falls or fx Enc ca and D D level is tx   Disc need for calcium/ vitamin D/ wt bearing exercise and bone density test every 2 y to monitor Disc safety/ fracture risk in detail

## 2022-08-08 NOTE — Assessment & Plan Note (Signed)
Vitamin D level is therapeutic with current supplementation Disc importance of this to bone and overall health  Level of 44.4

## 2022-08-08 NOTE — Assessment & Plan Note (Signed)
Lab Results  Component Value Date   HGBA1C 6.3 08/07/2022   Stable Good habits Plan to continue metformin 500 mg bid Noted microalb  On arb  On statin  Nl foot exam  F/u 6 mo

## 2022-08-09 ENCOUNTER — Ambulatory Visit
Admission: RE | Admit: 2022-08-09 | Discharge: 2022-08-09 | Disposition: A | Discharge status: Home / Self Care | Payer: Medicare Other | Source: Ambulatory Visit | Attending: Family Medicine | Admitting: Family Medicine

## 2022-08-09 DIAGNOSIS — I739 Peripheral vascular disease, unspecified: Secondary | ICD-10-CM | POA: Diagnosis not present

## 2022-08-09 DIAGNOSIS — Z1231 Encounter for screening mammogram for malignant neoplasm of breast: Secondary | ICD-10-CM

## 2022-08-16 ENCOUNTER — Other Ambulatory Visit: Payer: Self-pay | Admitting: Family Medicine

## 2022-09-01 DIAGNOSIS — H401132 Primary open-angle glaucoma, bilateral, moderate stage: Secondary | ICD-10-CM | POA: Diagnosis not present

## 2022-09-01 LAB — HM DIABETES EYE EXAM

## 2022-09-21 ENCOUNTER — Encounter: Payer: Self-pay | Admitting: Family Medicine

## 2022-09-23 ENCOUNTER — Other Ambulatory Visit: Payer: Self-pay | Admitting: Family Medicine

## 2022-09-25 DIAGNOSIS — L03032 Cellulitis of left toe: Secondary | ICD-10-CM | POA: Diagnosis not present

## 2022-10-10 DIAGNOSIS — I739 Peripheral vascular disease, unspecified: Secondary | ICD-10-CM | POA: Diagnosis not present

## 2022-10-11 ENCOUNTER — Other Ambulatory Visit: Payer: Self-pay | Admitting: Family Medicine

## 2022-11-22 ENCOUNTER — Telehealth: Payer: Self-pay

## 2022-11-22 DIAGNOSIS — I70222 Atherosclerosis of native arteries of extremities with rest pain, left leg: Secondary | ICD-10-CM | POA: Diagnosis not present

## 2022-11-22 NOTE — Progress Notes (Signed)
Care Management & Coordination Services Pharmacy Team  Reason for Encounter: Appointment Reminder  Contacted patient to confirm telephone appointment with Charlene Brooke, PharmD on 11/27/2022 at 8:45.  Unsuccessful outreach. Left voicemail for patient to return call.  Star Rating Drugs:  Medication:  Last Fill: Day Supply Rosuvastatin 20 mg 10/19/2022 100 Metformin 500 mg 10/01/2022 100 Olmesartan 40 mg 10/01/2022 100  Care Gaps: Annual wellness visit in last year? Yes 08/04/2022  If Diabetic: Last eye exam / retinopathy screening: Up to date Last diabetic foot exam: Over due  Charlene Brooke, PharmD notified  [name], [place] Assistant [phone]

## 2022-11-27 ENCOUNTER — Encounter: Payer: Medicare Other | Admitting: Pharmacist

## 2022-11-27 ENCOUNTER — Telehealth: Payer: Self-pay | Admitting: Pharmacist

## 2022-11-27 DIAGNOSIS — M810 Age-related osteoporosis without current pathological fracture: Secondary | ICD-10-CM

## 2022-11-27 NOTE — Telephone Encounter (Signed)
Care Management & Coordination Services Outreach Note  11/27/2022 Name: Kathy Cunningham MRN: [id number] DOB: October 15, 1935  Referred by: Tower, Wynelle Fanny, MD  Patient had a phone appointment scheduled with clinical pharmacist today.  An unsuccessful telephone outreach was attempted today. The patient was referred to the pharmacist for assistance with medications, care management and care coordination.   Patient will NOT be penalized in any way for missing a Care Management & Coordination Services appointment. The no-show fee does not apply.  If possible, a message was left to return call to: [phone] or to [facility].  Charlene Brooke, PharmD, BCACP Clinical Pharmacist [facility] [phone]

## 2022-11-27 NOTE — Progress Notes (Unsigned)
Care Management & Coordination Services Pharmacy Note  11/27/2022 Name:  Kathy Cunningham MRN:  [id number] DOB:  1936/02/22  Summary: ***  Recommendations/Changes made from today's visit: ***  Follow up plan: -Health Concierge will call patient *** -Pharmacist follow up televisit scheduled for *** -PCP annual due 08/2023    Subjective: Kathy Cunningham is an 87 y.o. year old female who is a primary patient of Tower, Wynelle Fanny, MD.  The care coordination team was consulted for assistance with disease management and care coordination needs.    Engaged with patient by telephone for follow up visit.  Recent office visits: 08/08/22 Dr Glori Bickers OV: annual - A1c 6.3%; advised to restart alendronate  Recent consult visits: 09/01/22 Dr George Ina (Ophthalmology): [facility] visits: None in previous 6 months   Objective:  Lab Results  Component Value Date   CREATININE 1.03 08/07/2022   BUN 14 08/07/2022   GFR 49.85 (L) 08/07/2022   GFRNONAA 50 (L) 02/01/2021   GFRAA 48 (L) 06/06/2020   NA 141 08/07/2022   K 4.0 08/07/2022   CALCIUM 9.4 08/07/2022   CO2 30 08/07/2022   GLUCOSE 105 (H) 08/07/2022    Lab Results  Component Value Date/Time   HGBA1C 6.3 08/07/2022 08:08 AM   HGBA1C 6.3 10/03/2021 09:56 AM   GFR 49.85 (L) 08/07/2022 08:08 AM   GFR 55.25 (L) 10/03/2021 09:56 AM   MICROALBUR 13.3 (H) 08/07/2022 08:08 AM   MICROALBUR 4.8 (H) 10/04/2009 09:15 AM    Last diabetic Eye exam:  Lab Results  Component Value Date/Time   HMDIABEYEEXA No Retinopathy 09/01/2022 12:00 AM    Last diabetic Foot exam:  Lab Results  Component Value Date/Time   HMDIABFOOTEX yes 10/20/2010 12:00 AM     Lab Results  Component Value Date   CHOL 162 08/07/2022   HDL 73.60 08/07/2022   LDLCALC 76 08/07/2022   TRIG 63.0 08/07/2022   CHOLHDL 2 08/07/2022       Latest Ref Rng & Units 08/07/2022    8:08 AM 10/03/2021    9:56 AM 03/29/2021   12:12 PM  Hepatic Function  Total Protein  6.0 - 8.3 g/dL 6.9  7.4  7.2   Albumin 3.5 - 5.2 g/dL 4.4  4.4  4.4   AST 0 - 37 U/L 18  19  16    ALT 0 - 35 U/L 8  9  8    Alk Phosphatase 39 - 117 U/L 51  59  64   Total Bilirubin 0.2 - 1.2 mg/dL 0.5  0.7  0.6     Lab Results  Component Value Date/Time   TSH 1.46 08/07/2022 08:08 AM   TSH 1.09 03/29/2021 12:12 PM       Latest Ref Rng & Units 08/07/2022    8:08 AM 03/29/2021   12:12 PM 02/01/2021    2:22 AM  CBC  WBC 4.0 - 10.5 K/uL 4.0  4.6  7.5   Hemoglobin 12.0 - 15.0 g/dL 10.5  11.5  11.1   Hematocrit 36.0 - 46.0 % 31.2  35.3  34.5   Platelets 150.0 - 400.0 K/uL 252.0  286.0  282     Lab Results  Component Value Date/Time   VD25OH 44.41 08/07/2022 08:08 AM   VD25OH 39.91 03/29/2021 12:12 PM   VITAMINB12 400 05/06/2008 11:50 AM    Clinical ASCVD: No  The ASCVD Risk score (Arnett DK, et al., 2019) failed to calculate for the following reasons:   The 2019 ASCVD risk  score is only valid for ages 40 to 93        08/04/2022    8:54 AM 03/29/2021   11:35 AM 04/21/2019   11:29 AM  Depression screen PHQ 2/9  Decreased Interest 1 0 0  Down, Depressed, Hopeless 0 0 0  PHQ - 2 Score 1 0 0     Social History   Tobacco Use  Smoking Status Never  Smokeless Tobacco Never   BP Readings from Last 3 Encounters:  08/08/22 (!) 144/80  01/31/22 (!) 116/48  01/17/22 (!) 99/50   Pulse Readings from Last 3 Encounters:  08/08/22 70  01/31/22 63  01/17/22 64   Wt Readings from Last 3 Encounters:  08/08/22 123 lb 2 oz (55.8 kg)  08/04/22 126 lb (57.2 kg)  01/31/22 126 lb (57.2 kg)   BMI Readings from Last 3 Encounters:  08/08/22 24.45 kg/m  08/04/22 24.61 kg/m  01/31/22 24.61 kg/m    Allergies  Allergen Reactions   Atorvastatin Other (See Comments)    REACTION: cramps   Sulfonamide Derivatives Rash    Medications Reviewed Today     Reviewed by Abner Greenspan, MD (Physician) on 08/08/22 at 1354  Med List Status: <None>   Medication Order Taking? Sig  Documenting Provider Last Dose Status Informant  Accu-Chek Softclix Lancets lancets [id number] Yes Use as instructed Tower, Wynelle Fanny, MD Taking Active   alendronate (FOSAMAX) 70 MG tablet [id number] Yes Take 1 tablet (70 mg total) by mouth every 7 (seven) days. Take with a full glass of water on an empty stomach. Tower, Wynelle Fanny, MD Taking Active   ALPRAZolam Duanne Moron) 0.5 MG tablet [id number] Yes 1/2 to 1 pill by mouth twice daily as needed for anxiety with travel, caution of sedation Tower, Wynelle Fanny, MD Taking Active Self  amLODipine (NORVASC) 10 MG tablet [id number] Yes TAKE 1 TABLET BY MOUTH DAILY Tower, Wynelle Fanny, MD Taking Active   bimatoprost (LUMIGAN) 0.01 % SOLN [id number] Yes Place 1 drop into both eyes at bedtime. [provider] Taking Active Self  brimonidine (ALPHAGAN P) 0.1 % SOLN [id number] Yes Place 1 drop into both eyes 2 (two) times daily. [provider] Taking Active Self  Cal Carb-Mag Hydrox-Simeth (ROLAIDS ADVANCED PO) [id number] Yes Take 1 tablet by mouth as needed (indigestion). [provider] Taking Active   calcium carbonate (TUMS - DOSED IN MG ELEMENTAL CALCIUM) 500 MG chewable tablet [id number] Yes Chew 1 tablet by mouth daily as needed for indigestion or heartburn. [provider] Taking Active   cephALEXin (KEFLEX) 500 MG capsule [id number] Yes Take 500 mg by mouth 3 (three) times daily. [provider] Taking Active   cholecalciferol (VITAMIN D) 1000 UNITS tablet [id number] Yes Take 2,000 Units by mouth daily.  [provider] Taking Active Self  glucose blood (ACCU-CHEK GUIDE) test strip [id number] Yes Use as instructed Tower, Wynelle Fanny, MD Taking Active   hydrochlorothiazide (HYDRODIURIL) 25 MG tablet [id number] Yes TAKE 1 TABLET BY MOUTH  DAILY Tower, Wynelle Fanny, MD Taking Active   metFORMIN (GLUCOPHAGE) 500 MG tablet [id number] Yes TAKE 1 TABLET BY MOUTH TWICE  DAILY WITH MEALS Tower, Wynelle Fanny, MD Taking Active   metoprolol tartrate  (LOPRESSOR) 100 MG tablet [id number] Yes TAKE 1 TABLET BY MOUTH TWICE  DAILY Tower, Wynelle Fanny, MD Taking Active   olmesartan (BENICAR) 40 MG tablet [id number] Yes TAKE 1 TABLET BY MOUTH DAILY Tower, Wynelle Fanny, MD Taking Active   potassium chloride SA (KLOR-CON M) 20 MEQ  tablet [id number] Yes TAKE 1 TABLET BY MOUTH DAILY Tower, Wynelle Fanny, MD Taking Active   rosuvastatin (CRESTOR) 20 MG tablet [id number] Yes TAKE 1 TABLET BY MOUTH DAILY AT  [facility], Wynelle Fanny, MD Taking Active             SDOH:  (Social Determinants of Health) assessments and interventions performed: {yes/no:20286} SDOH Interventions    Flowsheet Row Clinical Support from 08/04/2022 in [place] [facility] from 11/28/2021 in [place] at [facility] from 05/10/2021 in [place] at [facility] [place] Interventions Intervention Not Indicated Intervention Not Indicated --  Housing Interventions Intervention Not Indicated Intervention Not Indicated --  Transportation Interventions Intervention Not Indicated -- --  Utilities Interventions Intervention Not Indicated -- --  Financial Strain Interventions Intervention Not Indicated -- Intervention Not Indicated  Stress Interventions Intervention Not Indicated -- --  Social Connections Interventions Intervention Not Indicated -- --       Medication Assistance: {MEDASSISTANCEINFO:25044}  Medication Access: Within the past 30 days, how often has patient missed a dose of medication? *** Is a pillbox or other method used to improve adherence? {YES/NO:21197} Factors that may affect medication adherence? {CHL DESC; BARRIERS:21522} Are meds synced by current pharmacy? {YES/NO:21197} Are meds delivered by current pharmacy? {YES/NO:21197} Does patient experience delays in picking up medications due to transportation concerns?  {YES/NO:21197}  [facility] Reviewed: Is patient disadvantaged to use [facility]?: {YES/NO:21197} Current Rx insurance plan: *** Name and location of Current pharmacy:  [id number] - [place], [place] - [address] [place] [address] Phone: [phone] Fax: [phone]  [facility] ([place], [place] [address] [place] [address] Phone: [phone] Fax: [place], [address] [place] [address] Phone: [phone] Fax: [phone]  [facility] services reviewed with patient today?: {YES/NO:21197} Patient requests to transfer care to [facility]?: {YES/NO:21197} Reason patient declined to change pharmacies: {US patient preference:27474}  Compliance/Adherence/Medication fill history: Care Gaps: Foot exam (due 09/2022)  Star-Rating Drugs: Rosuvastatin - PDC 100% Olmesartan - PDC 100% Metformin - PDC 100%   Assessment/Plan  Hypertension (BP goal <140/90) -Controlled -per home readings; pt is concerned about accuracy of home wrist monitor, she brought it to office today  -Current home BP readings: 110s/50s - 140/70s at home Wrist cuff: 154/103, P 68 Omron: 142/69, P 67 Manual: 144/64, P 68 -Discussed BP monitors - wrist cuffs tends to be less accurate, advised pt to get an arm-cuff automatic monitor -Current treatment: Amlodipine 10 mg daily AM - Appropriate, Effective, Safe, Accessible HCTZ 25 mg daily AM -Appropriate, Effective, Safe, Accessible Potassium 20 mEq  daily AM -Appropriate, Effective, Safe, Accessible Metoprolol tartrate 100 mg BID -Appropriate, Effective, Safe, Accessible Olmesartan 40 mg daily PM -Appropriate, Effective, Safe, Accessible -Medications previously tried: none  -Educated on BP goals and  benefits of medications for prevention of heart attack, stroke and kidney damage; Importance of home blood pressure monitoring; Proper BP monitoring technique; Symptoms of hypotension and importance of maintaining adequate hydration; -Counseled to monitor BP at home 3x weekly  -Recommended to continue current medication; advised pt to contact provider if BP consistently > 150/90 or < 100/50  Hyperlipidemia: (LDL  goal < 100) -Controlled - LDL 76 (08/2022), adherence affirmed  -Current treatment: Rosuvastatin 20 mg daily PM -Appropriate, Effective, Safe, Accessible -Medications previously tried: none  -Educated on: importance of daily adherence -Recommended to continue current medication  Diabetes (A1c goal <7%) -Controlled - A1c 6.3% (09/2021) -Home BG: 110-140 typically -Current medications: Metformin 500 mg BID -Appropriate, Effective, Safe, Accessible Testing supplies  -Medications previously tried: none  -Educated on Prevention and management of hypoglycemic episodes -Pt is due for A1c and kidney evaluation; recommend lab eval at next OV -Recommended to continue current medication  Osteoporosis (Goal: prevent fractures) -Controlled - planning 5 years of Fosamax -Last DEXA Scan: 06/22/21   T-Score femoral neck: -3.0  T-Score lumbar spine: -1.2 -Patient is a candidate for pharmacologic treatment due to T-Score < -2.5 in femoral neck -Current treatment  Alendronate 70  mg weekly (started 07/2021) - *** -Medications previously tried: risedronate (x 5 years)  -Recommend weight-bearing and muscle strengthening exercises for building and maintaining bone density. -Recommended to continue current medication; DEXA due 07/2023  Chronic Kidney Disease Stage 3a  -All medications assessed for renal dosing and appropriateness in chronic kidney disease. -UACR 31 (12/23) -on ARB -Recommended to continue current medication  Health Maintenance -Vaccine gaps: [place],  PharmD, Para March, CPP Clinical Pharmacist Practitioner [place] at [facility] [phone]

## 2022-11-28 MED ORDER — ALENDRONATE SODIUM 70 MG PO TABS: 70.0000 mg | ORAL_TABLET | ORAL | 3 refills | Status: DC

## 2022-11-28 NOTE — Telephone Encounter (Cosign Needed)
Called patient; stated she has been meaning to call and get refills of her Fosamax; please send in refills to [facility].  Charlene Brooke, PharmD notified  [name], [place] Clinical Pharmacy Assistant [phone]

## 2022-11-28 NOTE — Telephone Encounter (Signed)
Refill sent.

## 2022-12-08 ENCOUNTER — Ambulatory Visit (INDEPENDENT_AMBULATORY_CARE_PROVIDER_SITE_OTHER): Payer: Medicare Other | Admitting: Family Medicine

## 2022-12-08 ENCOUNTER — Encounter: Payer: Self-pay | Admitting: Family Medicine

## 2022-12-08 VITALS — BP 135/70 | HR 69 | Temp 97.8°F | Ht 59.5 in | Wt 119.4 lb

## 2022-12-08 DIAGNOSIS — E876 Hypokalemia: Secondary | ICD-10-CM | POA: Diagnosis not present

## 2022-12-08 DIAGNOSIS — L600 Ingrowing nail: Secondary | ICD-10-CM | POA: Diagnosis not present

## 2022-12-08 DIAGNOSIS — R002 Palpitations: Secondary | ICD-10-CM | POA: Insufficient documentation

## 2022-12-08 DIAGNOSIS — R000 Tachycardia, unspecified: Secondary | ICD-10-CM

## 2022-12-08 DIAGNOSIS — E785 Hyperlipidemia, unspecified: Secondary | ICD-10-CM

## 2022-12-08 DIAGNOSIS — E1169 Type 2 diabetes mellitus with other specified complication: Secondary | ICD-10-CM | POA: Diagnosis not present

## 2022-12-08 DIAGNOSIS — E119 Type 2 diabetes mellitus without complications: Secondary | ICD-10-CM | POA: Diagnosis not present

## 2022-12-08 DIAGNOSIS — I739 Peripheral vascular disease, unspecified: Secondary | ICD-10-CM | POA: Diagnosis not present

## 2022-12-08 DIAGNOSIS — D649 Anemia, unspecified: Secondary | ICD-10-CM

## 2022-12-08 DIAGNOSIS — I10 Essential (primary) hypertension: Secondary | ICD-10-CM

## 2022-12-08 DIAGNOSIS — D568 Other thalassemias: Secondary | ICD-10-CM

## 2022-12-08 DIAGNOSIS — S30860A Insect bite (nonvenomous) of lower back and pelvis, initial encounter: Secondary | ICD-10-CM | POA: Diagnosis not present

## 2022-12-08 DIAGNOSIS — W57XXXA Bitten or stung by nonvenomous insect and other nonvenomous arthropods, initial encounter: Secondary | ICD-10-CM

## 2022-12-08 LAB — COMPREHENSIVE METABOLIC PANEL
ALT: 9 U/L (ref 0–35)
AST: 20 U/L (ref 0–37)
Albumin: 4.6 g/dL (ref 3.5–5.2)
Alkaline Phosphatase: 47 U/L (ref 39–117)
BUN: 20 mg/dL (ref 6–23)
CO2: 29 mEq/L (ref 19–32)
Calcium: 9.9 mg/dL (ref 8.4–10.5)
Chloride: 102 mEq/L (ref 96–112)
Creatinine, Ser: 1.17 mg/dL (ref 0.40–1.20)
GFR: 42.68 mL/min — ABNORMAL LOW (ref >60.00)
Glucose, Bld: 105 mg/dL — ABNORMAL HIGH (ref 70–99)
Potassium: 3.9 mEq/L (ref 3.5–5.1)
Sodium: 138 mEq/L (ref 135–145)
Total Bilirubin: 0.7 mg/dL (ref 0.2–1.2)
Total Protein: 7.5 g/dL (ref 6.0–8.3)

## 2022-12-08 LAB — CBC WITH DIFFERENTIAL/PLATELET
Basophils Absolute: 0.2 10*3/uL — ABNORMAL HIGH (ref 0.0–0.1)
Basophils Relative: 3.4 % — ABNORMAL HIGH (ref 0.0–3.0)
Eosinophils Absolute: 0.2 10*3/uL (ref 0.0–0.7)
Eosinophils Relative: 3.4 % (ref 0.0–5.0)
HCT: 34.3 % — ABNORMAL LOW (ref 36.0–46.0)
Hemoglobin: 11.4 g/dL — ABNORMAL LOW (ref 12.0–15.0)
Lymphocytes Relative: 39.5 % (ref 12.0–46.0)
Lymphs Abs: 1.9 10*3/uL (ref 0.7–4.0)
MCHC: 33.4 g/dL (ref 30.0–36.0)
MCV: 93.3 fl (ref 78.0–100.0)
Monocytes Absolute: 0.5 10*3/uL (ref 0.1–1.0)
Monocytes Relative: 10.1 % (ref 3.0–12.0)
Neutro Abs: 2.1 10*3/uL (ref 1.4–7.7)
Neutrophils Relative %: 43.6 % (ref 43.0–77.0)
Platelets: 269 10*3/uL (ref 150.0–400.0)
RBC: 3.67 Mil/uL — ABNORMAL LOW (ref 3.87–5.11)
RDW: 13.9 % (ref 11.5–15.5)
WBC: 4.7 10*3/uL (ref 4.0–10.5)

## 2022-12-08 LAB — LIPID PANEL
Cholesterol: 166 mg/dL (ref 0–200)
HDL: 72.4 mg/dL (ref >39.00)
LDL Cholesterol: 78 mg/dL (ref 0–99)
NonHDL: 93.29
Total CHOL/HDL Ratio: 2
Triglycerides: 78 mg/dL (ref 0.0–149.0)
VLDL: 15.6 mg/dL (ref 0.0–40.0)

## 2022-12-08 LAB — IRON: Iron: 90 ug/dL (ref 42–145)

## 2022-12-08 LAB — HEMOGLOBIN A1C: Hgb A1c MFr Bld: 6.3 % (ref 4.6–6.5)

## 2022-12-08 MED ORDER — DOXYCYCLINE HYCLATE 100 MG PO TABS
100.0000 mg | ORAL_TABLET | Freq: Two times a day (BID) | ORAL | 0 refills | Status: DC
Start: 2022-12-08 — End: 2023-08-21

## 2022-12-08 NOTE — Progress Notes (Signed)
Subjective:    Patient ID: Kathy Cunningham, female    DOB: 05/30/1936, 87 y.o.   MRN: [id number]  HPI Pt presents for  Heart rate (after diarrhea)  Left foot /nail problem   Tick bite - L back   Wt Readings from Last 3 Encounters:  12/08/22 119 lb 6 oz (54.1 kg)  08/08/22 123 lb 2 oz (55.8 kg)  08/04/22 126 lb (57.2 kg)   23.71 kg/m  Vitals:   12/08/22 1227 12/08/22 1316  BP: (!) 150/72 135/70  Pulse: 69   Temp: 97.8 F (36.6 C)   SpO2: 97%     Had a bad case of diarrhea  Vomited one time  Then started quivering in her chest   Diarrhea is better Whole family had it  Everyone is better now  Could have been food or viral   No sob No chest pain  No exercise intolerance   Feets like it is going fast  Then calms down   Takes K every other day  Supposed to be today   Toe is botheing her - L great toe  Was ingrown and pulled a pc out  Bothering her more now   Had a test noting L leg has worse circulation  Went to vascular surgery  Wants to do  a cath      HTN bp is stable today  No cp or palpitations or headaches or edema  No side effects to medicines  BP Readings from Last 3 Encounters:  12/08/22 135/70  08/08/22 (!) 144/80  01/31/22 (!) 116/48       Amlodipine 10 mg daily  Hctz 25 mg daily  Metoprolol 100 mg bid Olmesartan 40 mg daily    Pulse Readings from Last 3 Encounters:  12/08/22 69  08/08/22 70  01/31/22 63   EKG  Some PAC  Rate of 68    Tick bite  Pulled tick off Monday  Tiny  Not engorged  Red spot   Patient Active Problem List   Diagnosis Date Noted   PVD (peripheral vascular disease) 12/08/2022   Ingrown nail of great toe 12/08/2022   Tick bite of back 12/08/2022   Palpitations 12/08/2022   Dehydration 06/07/2020   Hearing loss 10/22/2019   Tinnitus aurium, right 09/26/2019   Medicare annual wellness visit, subsequent 04/21/2019   Initial Medicare annual wellness visit 06/15/2015   Routine general medical  examination at a health care facility 06/15/2015   Estrogen deficiency 06/15/2015   Breast cancer screening 12/17/2013   Encounter for screening mammogram for breast cancer 04/24/2011   Glaucoma 04/24/2011   Vitamin D deficiency 04/24/2011   Anxiety 10/20/2010   HYPOKALEMIA 10/22/2009   Other thalassemia    Osteoporosis 05/01/2008   Diabetes type 2, controlled 07/17/2007   Hyperlipidemia associated with type 2 diabetes mellitus 07/17/2007   Anemia 07/17/2007   Essential hypertension 07/17/2007   PREMATURE VENTRICULAR CONTRACTIONS, FREQUENT 07/17/2007   UTI'S, RECURRENT 07/17/2007   Past Medical History:  Diagnosis Date   Anemia    Arthritis    hands   Diabetes mellitus    type II   Dizziness    or vertigo   Glaucoma    Hyperlipidemia    Hypertension    Posterior auricular lymphadenopathy    Right - "comes and goes"   Past Surgical History:  Procedure Laterality Date   bladder papilloma  2006   CATARACT EXTRACTION W/PHACO Left 01/17/2022   Procedure: CATARACT EXTRACTION PHACO AND INTRAOCULAR LENS  PLACEMENT (IOC) LEFT DIABETIC RAYNOR LENS;  Surgeon: Galen ManilaPorfilio, William, MD;  [facility];  Service: Ophthalmology;  Laterality: Left;  Diabetic 19.50 02:06.5   CATARACT EXTRACTION W/PHACO Right 01/31/2022   Procedure: CATARACT EXTRACTION PHACO AND INTRAOCULAR LENS PLACEMENT (IOC) RIGHT DIABETIC RAYNOR LENS;  Surgeon: Galen ManilaPorfilio, William, MD;  [facility];  Service: Ophthalmology;  Laterality: Right;  10.55 0:56.4   ROTATOR CUFF REPAIR  01/2002   vaginal hysterectomy     Social History   Tobacco Use   Smoking status: Never   Smokeless tobacco: Never  Vaping Use   Vaping Use: Never used  Substance Use Topics   Alcohol use: No    Alcohol/week: 0.0 standard drinks of alcohol   Drug use: No   Family History  Problem Relation Age of Onset   Heart disease Mother        MI   Heart attack Mother    Hypertension Mother    Heart disease  Father        MI   Heart attack Father    Hypertension Father    Cancer Brother        pancreatic    Allergies  Allergen Reactions   Atorvastatin Other (See Comments)    REACTION: cramps   Risedronate Sodium Nausea And Vomiting and Other (See Comments)   Sulfonamide Derivatives Rash   Current Outpatient Medications on File Prior to Visit  Medication Sig Dispense Refill   Accu-Chek Softclix Lancets lancets Use as instructed 100 each 3   alendronate (FOSAMAX) 70 MG tablet Take 1 tablet (70 mg total) by mouth every 7 (seven) days. Take with a full glass of water on an empty stomach. 12 tablet 3   ALPRAZolam (XANAX) 0.5 MG tablet 1/2 to 1 pill by mouth twice daily as needed for anxiety with travel, caution of sedation 10 tablet 0   amLODipine (NORVASC) 10 MG tablet TAKE 1 TABLET BY MOUTH DAILY 100 tablet 2   bimatoprost (LUMIGAN) 0.01 % SOLN Place 1 drop into both eyes at bedtime.     brimonidine (ALPHAGAN P) 0.1 % SOLN Place 1 drop into both eyes 2 (two) times daily.     Cal Carb-Mag Hydrox-Simeth (ROLAIDS ADVANCED PO) Take 1 tablet by mouth as needed (indigestion).     calcium carbonate (TUMS - DOSED IN MG ELEMENTAL CALCIUM) 500 MG chewable tablet Chew 1 tablet by mouth daily as needed for indigestion or heartburn.     cholecalciferol (VITAMIN D) 1000 UNITS tablet Take 2,000 Units by mouth daily.      glucose blood (ACCU-CHEK GUIDE) test strip Use as instructed 100 each 3   hydrochlorothiazide (HYDRODIURIL) 25 MG tablet TAKE 1 TABLET BY MOUTH DAILY 80 tablet 2   metFORMIN (GLUCOPHAGE) 500 MG tablet TAKE 1 TABLET BY MOUTH TWICE  DAILY WITH MEALS 180 tablet 2   metoprolol tartrate (LOPRESSOR) 100 MG tablet TAKE 1 TABLET BY MOUTH TWICE  DAILY 180 tablet 2   olmesartan (BENICAR) 40 MG tablet TAKE 1 TABLET BY MOUTH DAILY 90 tablet 2   potassium chloride SA (KLOR-CON M) 20 MEQ tablet TAKE 1 TABLET BY MOUTH DAILY 90 tablet 2   rosuvastatin (CRESTOR) 20 MG tablet TAKE 1 TABLET BY MOUTH DAILY AT   NOON 90 tablet 2   No current facility-administered medications on file prior to visit.    Review of Systems  Constitutional:  Negative for activity change, appetite change, fatigue, fever and unexpected weight change.  HENT:  Negative for  congestion, ear pain, rhinorrhea, sinus pressure and sore throat.   Eyes:  Negative for pain, redness and visual disturbance.  Respiratory:  Negative for cough, shortness of breath and wheezing.   Cardiovascular:  Positive for palpitations. Negative for chest pain and leg swelling.  Gastrointestinal:  Negative for abdominal pain, blood in stool, constipation and diarrhea.  Endocrine: Negative for polydipsia and polyuria.  Genitourinary:  Negative for dysuria, frequency and urgency.  Musculoskeletal:  Negative for arthralgias, back pain and myalgias.  Skin:  Positive for color change and wound. Negative for pallor and rash.       Tick bite  Allergic/Immunologic: Negative for environmental allergies.  Neurological:  Negative for dizziness, syncope and headaches.  Hematological:  Negative for adenopathy. Does not bruise/bleed easily.  Psychiatric/Behavioral:  Negative for decreased concentration and dysphoric mood. The patient is not nervous/anxious.        Objective:   Physical Exam Constitutional:      General: She is not in acute distress.    Appearance: Normal appearance. She is well-developed. She is obese. She is not ill-appearing or diaphoretic.  HENT:     Head: Normocephalic and atraumatic.  Eyes:     General: No scleral icterus.       Right eye: No discharge.        Left eye: No discharge.     Conjunctiva/sclera: Conjunctivae normal.     Pupils: Pupils are equal, round, and reactive to light.  Neck:     Thyroid: No thyromegaly.     Vascular: No carotid bruit or JVD.  Cardiovascular:     Rate and Rhythm: Normal rate and regular rhythm.     Heart sounds: Normal heart sounds.     No gallop.     Comments: Decreased L pedal  pulses Pulmonary:     Effort: Pulmonary effort is normal. No respiratory distress.     Breath sounds: Normal breath sounds. No stridor. No wheezing, rhonchi or rales.  Chest:     Chest wall: No tenderness.  Abdominal:     General: There is no distension or abdominal bruit.     Palpations: Abdomen is soft.  Musculoskeletal:     Cervical back: Normal range of motion and neck supple.     Right lower leg: No edema.     Left lower leg: No edema.  Lymphadenopathy:     Cervical: No cervical adenopathy.  Skin:    General: Skin is warm and dry.     Coloration: Skin is not pale.     Findings: No rash.     Comments: 1-2 cm oval area of erythema and induration  Small scab No rings or central pallor  No rashes   L great toe nail is ingrown medially  Scant erythema and swelling  No drainage Little tenderness  Neurological:     Mental Status: She is alert.     Cranial Nerves: No cranial nerve deficit.     Motor: No weakness.     Coordination: Coordination normal.     Deep Tendon Reflexes: Reflexes are normal and symmetric. Reflexes normal.  Psychiatric:        Mood and Affect: Mood normal.           Assessment & Plan:   Problem List Items Addressed This Visit       Cardiovascular and Mediastinum   Essential hypertension    bp in fair control at this time  BP Readings from Last 1 Encounters:  12/08/22 135/70  No changes needed Most recent labs reviewed  Disc lifstyle change with low sodium diet and exercise  Plan to continue Amlodipine 10 mg daily  Hctz 25 mg daily  Metoprolol 100 mg bid Olmesartan 40 mg daily       Relevant Orders   CBC with Differential/Platelet   Lipid panel   Comprehensive metabolic panel   PVD (peripheral vascular disease)    It sounds per pt like she has reduced flow in L foot  There is an old doppler but no updated abi in chart Seeing podiatry and now it sounds like vascular -contemplating dx testing I adv her to continue f/u for testing    Cannot do toe nail procedure until her circulation is better         Endocrine   Diabetes type 2, controlled    A1c today       Relevant Orders   Hemoglobin A1c   Hyperlipidemia associated with type 2 diabetes mellitus    Disc goals for lipids and reasons to control them Rev last labs with pt Rev low sat fat diet in detail Rosuvastatin 20   Lab today   Having issues with PVD in L foot Vascular will need labs per pt       Relevant Orders   Lipid panel     Musculoskeletal and Integument   Ingrown nail of great toe    Some possible infection L medial foot  Px doxycycline  Per pt cannot do toe nail removal until PVD is corrected  (need to find out who she sees for vasc, this is not in chart)   I can see the pod visit but cannot read it in epic Will send for that         Tick bite of back    Small area of redness No assoc symptoms Deer tick ? How long there Was not engorged   Disc s/s of tick illness to watch for inc fever/rash and malaise   Had to tx infected toe/nail today- choose doxycycline which would also cover tick bourne illness         Other   Anemia    Cbc today  Has thal  May need a vasc procedure for pvd soon      Relevant Orders   CBC with Differential/Platelet   Iron   HYPOKALEMIA    Palpitations may be from this She was not compliant with klor con 20 - took qod instead of qd Then got diarrhea (resolved now)    Lab today  Inst to get back on K  Will increase if needed  Er precautions noted       Relevant Orders   Comprehensive metabolic panel   Other thalassemia    Cbc today      Relevant Orders   CBC with Differential/Platelet   Palpitations - Primary    Since episode of diarrhea (now resolved) In setting of hypokalemia and non comp with med  No assoc symptoms  Reassuring EKG-some pacs   Suspect related to low K? Lab pending Has started back on klor con 20 meq daily  Also enc better fluid intake         Other Visit Diagnoses     Tachycardia       Relevant Orders   EKG 12-Lead (Completed)   CBC with Differential/Platelet   Iron   Comprehensive metabolic panel

## 2022-12-08 NOTE — Assessment & Plan Note (Signed)
A1c today.  

## 2022-12-08 NOTE — Assessment & Plan Note (Signed)
Cbc today

## 2022-12-08 NOTE — Assessment & Plan Note (Signed)
Since episode of diarrhea (now resolved) In setting of hypokalemia and non comp with med  No assoc symptoms  Reassuring EKG-some pacs   Suspect related to low K? Lab pending Has started back on klor con 20 meq daily  Also enc better fluid intake

## 2022-12-08 NOTE — Assessment & Plan Note (Signed)
Some possible infection L medial foot  Px doxycycline  Per pt cannot do toe nail removal until PVD is corrected  (need to find out who she sees for vasc, this is not in chart)   I can see the pod visit but cannot read it in epic Will send for that

## 2022-12-08 NOTE — Assessment & Plan Note (Addendum)
Disc goals for lipids and reasons to control them Rev last labs with pt Rev low sat fat diet in detail Rosuvastatin 20   Lab today   Having issues with PVD in L foot Vascular will need labs per pt

## 2022-12-08 NOTE — Assessment & Plan Note (Signed)
Small area of redness No assoc symptoms Deer tick ? How long there Was not engorged   Disc s/s of tick illness to watch for inc fever/rash and malaise   Had to tx infected toe/nail today- choose doxycycline which would also cover tick bourne illness

## 2022-12-08 NOTE — Patient Instructions (Addendum)
Drink more water  Aim for 60 oz per day   Labs today   Go back to potassium every day  If your lab shows lower potassium than expected we will go up more   Follow up with your vascular doctor   If palpitations get severe or any chest pain or shortness of breath- go to the ER   Take doxycycline for the toe infection as directed   (it also covers tick fever)  Keep toe clean with soap and water  If you develop a rash or a fever - let us know

## 2022-12-08 NOTE — Assessment & Plan Note (Signed)
bp in fair control at this time  BP Readings from Last 1 Encounters:  12/08/22 135/70   No changes needed Most recent labs reviewed  Disc lifstyle change with low sodium diet and exercise  Plan to continue Amlodipine 10 mg daily  Hctz 25 mg daily  Metoprolol 100 mg bid Olmesartan 40 mg daily

## 2022-12-08 NOTE — Assessment & Plan Note (Signed)
Palpitations may be from this She was not compliant with klor con 20 - took qod instead of qd Then got diarrhea (resolved now)    Lab today  Inst to get back on K  Will increase if needed  Er precautions noted

## 2022-12-08 NOTE — Assessment & Plan Note (Signed)
It sounds per pt like she has reduced flow in L foot  There is an old doppler but no updated abi in chart Seeing podiatry and now it sounds like vascular -contemplating dx testing I adv her to continue f/u for testing   Cannot do toe nail procedure until her circulation is better

## 2022-12-08 NOTE — Assessment & Plan Note (Signed)
Cbc today  Has thal  May need a vasc procedure for pvd soon

## 2022-12-19 DIAGNOSIS — I70223 Atherosclerosis of native arteries of extremities with rest pain, bilateral legs: Secondary | ICD-10-CM | POA: Diagnosis not present

## 2022-12-19 DIAGNOSIS — M79669 Pain in unspecified lower leg: Secondary | ICD-10-CM | POA: Diagnosis not present

## 2022-12-24 DIAGNOSIS — I70222 Atherosclerosis of native arteries of extremities with rest pain, left leg: Secondary | ICD-10-CM | POA: Diagnosis not present

## 2022-12-26 ENCOUNTER — Telehealth: Payer: Self-pay | Admitting: Family Medicine

## 2022-12-26 MED ORDER — CLOPIDOGREL BISULFATE 75 MG PO TABS: 75.0000 mg | ORAL_TABLET | Freq: Every day | ORAL | 0 refills | Status: DC

## 2022-12-26 NOTE — Telephone Encounter (Signed)
Please send for notes, I cannot find any in the chart (? What she had done)  Dose of the medicine as well please (I assume 75 mg) and how long they want her to take it  Will advise from there  I am out of the office today Thanks

## 2022-12-26 NOTE — Telephone Encounter (Signed)
Pt said she went to [facility] and sees Dr. Talbert Nan. Pt said she had a procedure done where they went in her groin and "did something to my arteries" (pt isn't sure what they did). Pt said they did it this Sunday. Pt said she was suppose to start the Plavix yesterday but read all of the waring/ side eff info and was nervous to start so she hasn't and wanted to see if you think she needs to be on it given other meds. Pt said Dr. Darene Lamer didn't tell her how long he wants her to stay on it so she isn't sure. Pt did check bottle and it's the 75 mg dose.  Dr. Darene Lamer isn't in [facility] so I had to fax a med records request to their office.

## 2022-12-26 NOTE — Telephone Encounter (Signed)
It shouldn't have any interactions  I sent a dummy px just to put on her med list  Start it as instructed Avoid nsaids on this  If she has concerns she needs to voice them to the surgeon I look forward to reviewing the records

## 2022-12-26 NOTE — Telephone Encounter (Signed)
Pt notified of Dr. Royden Purl comments and verbalized understanding. She will start med. I did encourage pt to let surgeon know her concerns. She said he also put her on a baby asa. That was added to med list also

## 2022-12-26 NOTE — Telephone Encounter (Signed)
Patient had a procedure done on Sunday morning due to her poor circulation in her legs.She was prescribed clopidogrel for blood clots.She was advise to contact her pcp to make sure its okay to take this medication with her other medications that shes on?Please advise.

## 2023-01-03 DIAGNOSIS — L600 Ingrowing nail: Secondary | ICD-10-CM | POA: Diagnosis not present

## 2023-01-03 DIAGNOSIS — I739 Peripheral vascular disease, unspecified: Secondary | ICD-10-CM | POA: Diagnosis not present

## 2023-01-30 ENCOUNTER — Other Ambulatory Visit: Payer: Self-pay | Admitting: Family Medicine

## 2023-02-02 ENCOUNTER — Other Ambulatory Visit: Payer: Self-pay | Admitting: Family Medicine

## 2023-02-28 DIAGNOSIS — Z961 Presence of intraocular lens: Secondary | ICD-10-CM | POA: Diagnosis not present

## 2023-02-28 DIAGNOSIS — E119 Type 2 diabetes mellitus without complications: Secondary | ICD-10-CM | POA: Diagnosis not present

## 2023-02-28 DIAGNOSIS — H401132 Primary open-angle glaucoma, bilateral, moderate stage: Secondary | ICD-10-CM | POA: Diagnosis not present

## 2023-03-10 ENCOUNTER — Other Ambulatory Visit: Payer: Self-pay | Admitting: Family Medicine

## 2023-03-12 NOTE — Telephone Encounter (Signed)
Medication: Potassium Chloride 20 meq Directions: Take 1 tablet po daily  Last given: 09/25/22 Number refills: 2 Last o/v: 08/08/22, Acute 12/08/22 Follow up: 6 months Labs: 12/08/22  Medication: Metoprolol Tartrate (Lopressor) 100 mg  Directions: Take 1 tablet po BID  Last given: 09/25/22 Number refills: 2 Last o/v: 08/08/22, Acute 12/08/22 Follow up: 6 months Labs: 12/08/22  Medication: Metformin 500 mg Directions: Take 1 tablet po BID  Last given: 09/25/22 Number refills: 2 Last o/v: 08/08/22, Acute 12/08/22 Follow up: 6 months Labs: 12/08/22  Medication: Olmesartan 40 mg  Directions: take 1 tablet po daily  Last given: 09/25/22 Number refills: 2 Last o/v: 08/08/22, Acute 12/08/22 Follow up: 6 months  Labs: 12/08/22

## 2023-03-24 ENCOUNTER — Other Ambulatory Visit: Payer: Self-pay | Admitting: Family Medicine

## 2023-05-09 ENCOUNTER — Other Ambulatory Visit: Payer: Self-pay | Admitting: Family Medicine

## 2023-05-29 ENCOUNTER — Other Ambulatory Visit: Payer: Self-pay | Admitting: Family Medicine

## 2023-06-14 ENCOUNTER — Other Ambulatory Visit: Payer: Self-pay | Admitting: Family Medicine

## 2023-07-02 ENCOUNTER — Encounter (HOSPITAL_COMMUNITY): Payer: Self-pay

## 2023-07-02 ENCOUNTER — Ambulatory Visit (HOSPITAL_COMMUNITY)
Admission: EM | Admit: 2023-07-02 | Discharge: 2023-07-02 | Disposition: A | Discharge status: Home / Self Care | Payer: Medicare Other | Attending: Family Medicine | Admitting: Family Medicine

## 2023-07-02 DIAGNOSIS — T148XXA Other injury of unspecified body region, initial encounter: Secondary | ICD-10-CM

## 2023-07-02 DIAGNOSIS — M62830 Muscle spasm of back: Secondary | ICD-10-CM

## 2023-07-02 MED ORDER — CYCLOBENZAPRINE HCL 10 MG PO TABS: 10.0000 mg | ORAL_TABLET | Freq: Two times a day (BID) | ORAL | 0 refills | Status: DC | PRN

## 2023-07-02 NOTE — ED Triage Notes (Signed)
Pt presents with mid back pain that started Saturday. It got better but started again in the night. Pt is concerned about her kidneys she said that her doctor told her they were going bad.

## 2023-07-02 NOTE — ED Provider Notes (Signed)
[facility]    CSN: [id number] Arrival date & time: 07/02/23  1059      History   Chief Complaint Chief Complaint  Patient presents with   Back Pain    HPI Kathy Cunningham is a 87 y.o. female.   Patient is presenting with some low back pain has been going on for the past few days.  Patient states that it noted on Saturday.  Patient states that sometimes it gets better but then last night it became little bit more worse.  Patient states that the pain is located on the left side.  Patient was told by her primary care doctor that her kidneys are not doing well and so she was concerned she had kidney issues that is causing her back pain.  Patient has no other concerns at this time.  Patient denies any numbness tingling or radiation down her legs.   Back Pain   Past Medical History:  Diagnosis Date   Anemia    Arthritis    hands   Diabetes mellitus    type II   Dizziness    or vertigo   Glaucoma    Hyperlipidemia    Hypertension    Posterior auricular lymphadenopathy    Right - "comes and goes"    Patient Active Problem List   Diagnosis Date Noted   PVD (peripheral vascular disease) (HCC) 12/08/2022   Ingrown nail of great toe 12/08/2022   Tick bite of back 12/08/2022   Palpitations 12/08/2022   Dehydration 06/07/2020   Hearing loss 10/22/2019   Tinnitus aurium, right 09/26/2019   Medicare annual wellness visit, subsequent 04/21/2019   Initial Medicare annual wellness visit 06/15/2015   Routine general medical examination at a health care facility 06/15/2015   Estrogen deficiency 06/15/2015   Breast cancer screening 12/17/2013   Encounter for screening mammogram for breast cancer 04/24/2011   Glaucoma 04/24/2011   Vitamin D deficiency 04/24/2011   Anxiety 10/20/2010   HYPOKALEMIA 10/22/2009   Other thalassemia (HCC)    Osteoporosis 05/01/2008   Diabetes type 2, controlled (HCC) 07/17/2007   Hyperlipidemia associated with type 2 diabetes mellitus  (HCC) 07/17/2007   Anemia 07/17/2007   Essential hypertension 07/17/2007   PREMATURE VENTRICULAR CONTRACTIONS, FREQUENT 07/17/2007   UTI'S, RECURRENT 07/17/2007    Past Surgical History:  Procedure Laterality Date   bladder papilloma  2006   CATARACT EXTRACTION W/PHACO Left 01/17/2022   Procedure: CATARACT EXTRACTION PHACO AND INTRAOCULAR LENS PLACEMENT (IOC) LEFT DIABETIC RAYNOR LENS;  Surgeon: Galen Manila, MD;  Location: [facility];  Service: Ophthalmology;  Laterality: Left;  Diabetic 19.50 02:06.5   CATARACT EXTRACTION W/PHACO Right 01/31/2022   Procedure: CATARACT EXTRACTION PHACO AND INTRAOCULAR LENS PLACEMENT (IOC) RIGHT DIABETIC RAYNOR LENS;  Surgeon: Galen Manila, MD;  Location: [facility];  Service: Ophthalmology;  Laterality: Right;  10.55 0:56.4   ROTATOR CUFF REPAIR  01/2002   vaginal hysterectomy      OB History   No obstetric history on file.      Home Medications    Prior to Admission medications   Medication Sig Start Date End Date Taking? Authorizing Provider  amLODipine (NORVASC) 10 MG tablet TAKE 1 TABLET BY MOUTH DAILY 01/30/23  Yes Tower, Audrie Gallus, MD  aspirin EC 81 MG tablet Take 81 mg by mouth daily. Swallow whole.   Yes [provider]  cholecalciferol (VITAMIN D) 1000 UNITS tablet Take 2,000 Units by mouth daily.    Yes [provider]  clopidogrel (PLAVIX) 75 MG tablet Take 1 tablet (75 mg total) by mouth daily. 12/26/22  Yes Tower, Audrie Gallus, MD  cyclobenzaprine (FLEXERIL) 10 MG tablet Take 1 tablet (10 mg total) by mouth 2 (two) times daily as needed for muscle spasms. 07/02/23  Yes Brenton Grills, MD  hydrochlorothiazide (HYDRODIURIL) 25 MG tablet TAKE 1 TABLET BY MOUTH DAILY 02/05/23  Yes Tower, Marne A, MD  metFORMIN (GLUCOPHAGE) 500 MG tablet TAKE 1 TABLET BY MOUTH TWICE  DAILY WITH MEALS 03/12/23  Yes Tower, Marne A, MD  metoprolol tartrate (LOPRESSOR) 100 MG tablet TAKE 1 TABLET BY MOUTH TWICE  DAILY 03/12/23  Yes  Tower, Marne A, MD  potassium chloride SA (KLOR-CON M) 20 MEQ tablet TAKE 1 TABLET BY MOUTH DAILY 06/15/23  Yes Tower, Marne A, MD  rosuvastatin (CRESTOR) 20 MG tablet TAKE 1 TABLET BY MOUTH DAILY AT  NOON 05/30/23  Yes Tower, Audrie Gallus, MD  Accu-Chek Softclix Lancets lancets Use as instructed 05/24/22   Tower, Audrie Gallus, MD  alendronate (FOSAMAX) 70 MG tablet Take 1 tablet (70 mg total) by mouth every 7 (seven) days. Take with a full glass of water on an empty stomach. 11/28/22   Tower, Audrie Gallus, MD  ALPRAZolam Prudy Feeler) 0.5 MG tablet 1/2 to 1 pill by mouth twice daily as needed for anxiety with travel, caution of sedation 02/09/20   Tower, Idamae Schuller A, MD  bimatoprost (LUMIGAN) 0.01 % SOLN Place 1 drop into both eyes at bedtime.    [provider]  brimonidine (ALPHAGAN P) 0.1 % SOLN Place 1 drop into both eyes 2 (two) times daily.    [provider]  Cal Carb-Mag Hydrox-Simeth (ROLAIDS ADVANCED PO) Take 1 tablet by mouth as needed (indigestion).    [provider]  calcium carbonate (TUMS - DOSED IN MG ELEMENTAL CALCIUM) 500 MG chewable tablet Chew 1 tablet by mouth daily as needed for indigestion or heartburn.    [provider]  doxycycline (VIBRA-TABS) 100 MG tablet Take 1 tablet (100 mg total) by mouth 2 (two) times daily. 12/08/22   Tower, Audrie Gallus, MD  glucose blood (ACCU-CHEK GUIDE) test strip Use as instructed 02/02/22   Tower, Audrie Gallus, MD  olmesartan (BENICAR) 40 MG tablet TAKE 1 TABLET BY MOUTH DAILY 03/12/23   Tower, Audrie Gallus, MD    Family History Family History  Problem Relation Age of Onset   Heart disease Mother        MI   Heart attack Mother    Hypertension Mother    Heart disease Father        MI   Heart attack Father    Hypertension Father    Cancer Brother        pancreatic     Social History Social History   Tobacco Use   Smoking status: Never   Smokeless tobacco: Never  Vaping Use   Vaping status: Never Used  Substance Use Topics   Alcohol  use: No    Alcohol/week: 0.0 standard drinks of alcohol   Drug use: No     Allergies   Atorvastatin, Risedronate sodium, and Sulfonamide derivatives   Review of Systems Review of Systems  Musculoskeletal:  Positive for back pain.     Physical Exam Triage Vital Signs ED Triage Vitals  Encounter Vitals Group     BP 07/02/23 1155 (!) 156/64     Systolic BP Percentile --      Diastolic BP Percentile --  Pulse Rate 07/02/23 1155 81     Resp 07/02/23 1155 17     Temp 07/02/23 1155 98.3 F (36.8 C)     Temp src --      SpO2 07/02/23 1155 100 %     Weight --      Height --      Head Circumference --      Peak Flow --      Pain Score 07/02/23 1151 5     Pain Loc --      Pain Education --      Exclude from Growth Chart --    No data found.  Updated Vital Signs BP (!) 156/64   Pulse 81   Temp 98.3 F (36.8 C)   Resp 17   SpO2 100%   Visual Acuity Right Eye Distance:   Left Eye Distance:   Bilateral Distance:    Right Eye Near:   Left Eye Near:    Bilateral Near:     Physical Exam  Inspection is negative for any erythema, bony abnormality or bruising of the lumbar area.  There is tenderness to the paraspinals over the lumbar area on the left side, no tenderness over the right side.  No tenderness to palpation over the spinous processes.  No radiation of numbness down her legs.  Straight leg test is negative.  Appropriate sidebending and flexion extension given patient's age. UC Treatments / Results  Labs (all labs ordered are listed, but only abnormal results are displayed) Labs Reviewed - No data to display  EKG   Radiology No results found.  Procedures Procedures (including critical care time)  Medications Ordered in UC Medications - No data to display  Initial Impression / Assessment and Plan / UC Course  I have reviewed the triage vital signs and the nursing notes.  Pertinent labs & imaging results that were available during my care of the  patient were reviewed by me and considered in my medical decision making (see chart for details).     Patient symptoms do appear to be related to a lumbar strain.  At this time, patient does have decreasing GFR so we will avoid any NSAIDs.  Patient was advised to take Tylenol and Flexeril as needed for pain.  Patient was also advised to apply direct heat over the affected area. Final Clinical Impressions(s) / UC Diagnoses   Final diagnoses:  Muscle strain  Muscle spasm of back     Discharge Instructions      Please take your oral once every 8 hours as needed.  Please be advised that this medication can make you slightly drowsy so if you feel like you are becoming too drowsy you can cut the pill in half or avoid taking the medication if you prefer.  I would avoid any NSAIDs such as ibuprofen at this time.  You may take Tylenol for pain  I would apply heat over the area that hurts as this will help relax the muscle.     ED Prescriptions     Medication Sig Dispense Auth. Provider   cyclobenzaprine (FLEXERIL) 10 MG tablet Take 1 tablet (10 mg total) by mouth 2 (two) times daily as needed for muscle spasms. 60 tablet Brenton Grills, MD      PDMP not reviewed this encounter.   Brenton Grills, MD 07/02/23 1228

## 2023-07-02 NOTE — Discharge Instructions (Signed)
Please take your oral once every 8 hours as needed.  Please be advised that this medication can make you slightly drowsy so if you feel like you are becoming too drowsy you can cut the pill in half or avoid taking the medication if you prefer.  I would avoid any NSAIDs such as ibuprofen at this time.  You may take Tylenol for pain  I would apply heat over the area that hurts as this will help relax the muscle.

## 2023-07-10 ENCOUNTER — Other Ambulatory Visit: Payer: Self-pay | Admitting: Family Medicine

## 2023-07-10 DIAGNOSIS — Z1231 Encounter for screening mammogram for malignant neoplasm of breast: Secondary | ICD-10-CM

## 2023-07-20 ENCOUNTER — Telehealth: Payer: Self-pay

## 2023-07-20 NOTE — Telephone Encounter (Signed)
error 

## 2023-08-09 ENCOUNTER — Ambulatory Visit (INDEPENDENT_AMBULATORY_CARE_PROVIDER_SITE_OTHER): Payer: Medicare Other

## 2023-08-09 VITALS — Ht 60.0 in | Wt 119.0 lb

## 2023-08-09 DIAGNOSIS — Z0000 Encounter for general adult medical examination without abnormal findings: Secondary | ICD-10-CM

## 2023-08-09 DIAGNOSIS — E1169 Type 2 diabetes mellitus with other specified complication: Secondary | ICD-10-CM | POA: Diagnosis not present

## 2023-08-09 DIAGNOSIS — H9193 Unspecified hearing loss, bilateral: Secondary | ICD-10-CM | POA: Diagnosis not present

## 2023-08-09 DIAGNOSIS — E785 Hyperlipidemia, unspecified: Secondary | ICD-10-CM | POA: Diagnosis not present

## 2023-08-09 NOTE — Progress Notes (Signed)
Subjective:   Kathy Cunningham is a 87 y.o. female who presents for Medicare Annual (Subsequent) preventive examination.  Visit Complete: Virtual I connected with  [name] on 08/09/23 by a audio enabled telemedicine application and verified that I am speaking with the correct person using two identifiers.  Patient Location: Home  Provider Location: Home Office  I discussed the limitations of evaluation and management by telemedicine. The patient expressed understanding and agreed to proceed.  Vital Signs: Because this visit was a virtual/telehealth visit, some criteria may be missing or patient reported. Any vitals not documented were not able to be obtained and vitals that have been documented are patient reported.  Patient Medicare AWV questionnaire was completed by the patient on (not done); I have confirmed that all information answered by patient is correct and no changes since this date.  Cardiac Risk Factors include: advanced age (>70men, >84 women);diabetes mellitus;dyslipidemia;hypertension   Objective:    Today's Vitals   08/09/23 0817  Weight: 119 lb (54 kg)  Height: 5' (1.524 m)   Body mass index is 23.24 kg/m.     08/09/2023    8:36 AM 08/04/2022    9:00 AM 01/31/2022    7:37 AM 01/17/2022    8:00 AM 02/01/2021    2:16 AM 06/06/2020   12:34 PM 04/09/2018   11:49 AM  Advanced Directives  Does Patient Have a Medical Advance Directive? No No No No No No No  Does patient want to make changes to medical advance directive?  No - Patient declined       Would patient like information on creating a medical advance directive?   No - Patient declined Yes (MAU/Ambulatory/Procedural Areas - Information given) No - Patient declined No - Patient declined No - Patient declined    Current Medications (verified) Outpatient Encounter Medications as of 08/09/2023  Medication Sig   Accu-Chek Softclix Lancets lancets Use as instructed   alendronate (FOSAMAX) 70 MG tablet Take 1  tablet (70 mg total) by mouth every 7 (seven) days. Take with a full glass of water on an empty stomach.   ALPRAZolam (XANAX) 0.5 MG tablet 1/2 to 1 pill by mouth twice daily as needed for anxiety with travel, caution of sedation   amLODipine (NORVASC) 10 MG tablet TAKE 1 TABLET BY MOUTH DAILY   aspirin EC 81 MG tablet Take 81 mg by mouth daily. Swallow whole.   bimatoprost (LUMIGAN) 0.01 % SOLN Place 1 drop into both eyes at bedtime.   brimonidine (ALPHAGAN P) 0.1 % SOLN Place 1 drop into both eyes 2 (two) times daily.   Cal Carb-Mag Hydrox-Simeth (ROLAIDS ADVANCED PO) Take 1 tablet by mouth as needed (indigestion).   calcium carbonate (TUMS - DOSED IN MG ELEMENTAL CALCIUM) 500 MG chewable tablet Chew 1 tablet by mouth daily as needed for indigestion or heartburn.   cholecalciferol (VITAMIN D) 1000 UNITS tablet Take 2,000 Units by mouth daily.    clopidogrel (PLAVIX) 75 MG tablet Take 1 tablet (75 mg total) by mouth daily.   glucose blood (ACCU-CHEK GUIDE) test strip Use as instructed   hydrochlorothiazide (HYDRODIURIL) 25 MG tablet TAKE 1 TABLET BY MOUTH DAILY   metFORMIN (GLUCOPHAGE) 500 MG tablet TAKE 1 TABLET BY MOUTH TWICE  DAILY WITH MEALS   metoprolol tartrate (LOPRESSOR) 100 MG tablet TAKE 1 TABLET BY MOUTH TWICE  DAILY   olmesartan (BENICAR) 40 MG tablet TAKE 1 TABLET BY MOUTH DAILY   potassium chloride SA (KLOR-CON M) 20 MEQ  tablet TAKE 1 TABLET BY MOUTH DAILY   rosuvastatin (CRESTOR) 20 MG tablet TAKE 1 TABLET BY MOUTH DAILY AT  NOON   cyclobenzaprine (FLEXERIL) 10 MG tablet Take 1 tablet (10 mg total) by mouth 2 (two) times daily as needed for muscle spasms. (Patient not taking: Reported on 08/09/2023)   doxycycline (VIBRA-TABS) 100 MG tablet Take 1 tablet (100 mg total) by mouth 2 (two) times daily. (Patient not taking: Reported on 08/09/2023)   No facility-administered encounter medications on file as of 08/09/2023.    Allergies (verified) Atorvastatin, Risedronate sodium, and  Sulfonamide derivatives   History: Past Medical History:  Diagnosis Date   Anemia    Arthritis    hands   Diabetes mellitus    type II   Dizziness    or vertigo   Glaucoma    Hyperlipidemia    Hypertension    Posterior auricular lymphadenopathy    Right - "comes and goes"   Past Surgical History:  Procedure Laterality Date   bladder papilloma  2006   CATARACT EXTRACTION W/PHACO Left 01/17/2022   Procedure: CATARACT EXTRACTION PHACO AND INTRAOCULAR LENS PLACEMENT (IOC) LEFT DIABETIC RAYNOR LENS;  Surgeon: Galen Manila, MD;  Location: [facility];  Service: Ophthalmology;  Laterality: Left;  Diabetic 19.50 02:06.5   CATARACT EXTRACTION W/PHACO Right 01/31/2022   Procedure: CATARACT EXTRACTION PHACO AND INTRAOCULAR LENS PLACEMENT (IOC) RIGHT DIABETIC RAYNOR LENS;  Surgeon: Galen Manila, MD;  Location: [facility];  Service: Ophthalmology;  Laterality: Right;  10.55 0:56.4   ROTATOR CUFF REPAIR  01/2002   vaginal hysterectomy     Family History  Problem Relation Age of Onset   Heart disease Mother        MI   Heart attack Mother    Hypertension Mother    Heart disease Father        MI   Heart attack Father    Hypertension Father    Cancer Brother        pancreatic    Social History   Socioeconomic History   Marital status: Widowed    Spouse name: Not on file   Number of children: Not on file   Years of education: Not on file   Highest education level: Not on file  Occupational History   Not on file  Tobacco Use   Smoking status: Never   Smokeless tobacco: Never  Vaping Use   Vaping status: Never Used  Substance and Sexual Activity   Alcohol use: No    Alcohol/week: 0.0 standard drinks of alcohol   Drug use: No   Sexual activity: Never  Other Topics Concern   Not on file  Social History Narrative   Not on file   Social Determinants of Health   Financial Resource Strain: Low Risk  (08/09/2023)   Overall Financial Resource Strain  (CARDIA)    Difficulty of Paying Living Expenses: Not hard at all  Food Insecurity: No Food Insecurity (08/09/2023)   Hunger Vital Sign    Worried About Running Out of Food in the Last Year: Never true    Ran Out of Food in the Last Year: Never true  Transportation Needs: No Transportation Needs (08/09/2023)   PRAPARE - Administrator, Civil Service (Medical): No    Lack of Transportation (Non-Medical): No  Physical Activity: Insufficiently Active (08/09/2023)   Exercise Vital Sign    Days of Exercise per Week: 2 days    Minutes of Exercise per Session: 30  min  Stress: No Stress Concern Present (08/09/2023)   [employer] of Occupational Health - Occupational Stress Questionnaire    Feeling of Stress : Not at all  Social Connections: Moderately Isolated (08/09/2023)   Social Connection and Isolation Panel [NHANES]    Frequency of Communication with Friends and Family: Once a week    Frequency of Social Gatherings with Friends and Family: Twice a week    Attends Religious Services: More than 4 times per year    Active Member of [employer] or Organizations: No    Attends Banker Meetings: Never    Marital Status: Widowed    Tobacco Counseling Counseling given: Not Answered  Clinical Intake:  Pre-visit preparation completed: No  Pain : No/denies pain    BMI - recorded: 23.24 Nutritional Status: BMI of 19-24  Normal Nutritional Risks: None Diabetes: Yes CBG done?: Yes (BS 148 in am yesterday (at home);has not taken this am) Did pt. bring in CBG monitor from home?: No  How often do you need to have someone help you when you read instructions, pamphlets, or other written materials from your doctor or pharmacy?: 1 - Never  Interpreter Needed?: No  Comments: daughter lives her Information entered by :: B.Adith Tejada,LPN  Activities of Daily Living    08/09/2023    8:36 AM  In your present state of health, do you have any difficulty performing the  following activities:  Hearing? 1  Vision? 1  Difficulty concentrating or making decisions? 1  Walking or climbing stairs? 0  Dressing or bathing? 0  Doing errands, shopping? 1  Comment does not like to drive:is afraid  Preparing Food and eating ? N  Using the Toilet? N  In the past six months, have you accidently leaked urine? Y  Do you have problems with loss of bowel control? N  Managing your Medications? Y  Managing your Finances? N  Housekeeping or managing your Housekeeping? N    Patient Care Team: Tower, Audrie Gallus, MD as PCP - General Galen Manila, MD as Referring Physician (Ophthalmology) Kathyrn Sheriff, [facility] (Inactive) as Pharmacist (Pharmacist)  Indicate any recent Medical Services you may have received from other than [facility] providers in the past year (date may be approximate).     Assessment:   This is a routine wellness examination for [place].  Hearing/Vision screen Hearing Screening - Comments:: Pt says she does not hear well and needs hearing test Audiology referral Vision Screening - Comments:: Pt says her vision is blurry reading:needs readers Has eye appt this month with Profilio 08/24/23   Goals Addressed             This Visit's Progress    Increase physical activity   Not on track    I will continue to exercise at least 30 min 3 days per week.      Patient Stated   On track    Starting 04/09/18, I will continue to take medications as prescribed.        Depression Screen    08/09/2023    8:31 AM 08/04/2022    8:54 AM 03/29/2021   11:35 AM 04/21/2019   11:29 AM 04/09/2018   11:12 AM 10/11/2016    1:01 PM 06/15/2015   10:47 AM  PHQ 2/9 Scores  PHQ - 2 Score 1 1 0 0 2 0 0  PHQ- 9 Score     3      Fall Risk    08/09/2023    8:24  AM 08/04/2022    8:56 AM 03/29/2021   11:35 AM 04/21/2019   11:30 AM 04/09/2018   11:12 AM  Fall Risk   Falls in the past year? 0 0 0 0 No  Number falls in past yr: 0 0  0   Injury with Fall? 0 0  0   Risk for  fall due to : No Fall Risks      Follow up Falls prevention discussed;Education provided Falls evaluation completed;Falls prevention discussed;Education provided Falls evaluation completed Falls evaluation completed     MEDICARE RISK AT HOME: Medicare Risk at Home Any stairs in or around the home?: Yes (one step up into kitchen) If so, are there any without handrails?: No Home free of loose throw rugs in walkways, pet beds, electrical cords, etc?: Yes Adequate lighting in your home to reduce risk of falls?: Yes Life alert?: No Use of a cane, walker or w/c?: No Grab bars in the bathroom?: No Shower chair or bench in shower?: No Elevated toilet seat or a handicapped toilet?: Yes  TIMED UP AND GO:  Was the test performed?  No    Cognitive Function:    04/09/2018   11:49 AM 10/11/2016    1:01 PM  MMSE - Mini Mental State Exam  Orientation to time 5 5  Orientation to Place 5 5  Registration 3 3  Attention/ Calculation 0 0  Recall 3 2  Recall-comments  pt was unable to recall 1 of 3 words  Language- name 2 objects 0 0  Language- repeat 1 1  Language- follow 3 step command 3 3  Language- read & follow direction 0 0  Write a sentence 0 0  Copy design 0 0  Total score 20 19        08/09/2023    8:38 AM 08/04/2022    8:57 AM  6CIT Screen  What Year? 0 points 0 points  What month? 0 points 0 points  What time? 0 points 0 points  Count back from 20 4 points 2 points  Months in reverse 2 points 2 points  Repeat phrase 0 points 0 points  Total Score 6 points 4 points    Immunizations Immunization History  Administered Date(s) Administered   Fluad Quad(high Dose 65+) 07/07/2019, 07/11/2021, 08/08/2022   Influenza, High Dose Seasonal PF 07/05/2018   Influenza,inj,Quad PF,6+ Mos 06/13/2013, 09/11/2014, 06/15/2015, 09/29/2016, 09/11/2017   PFIZER Comirnaty(Gray Top)Covid-19 Tri-Sucrose Vaccine 03/18/2021   PFIZER(Purple Top)SARS-COV-2 Vaccination 10/27/2019, 11/17/2019   Pfizer  Covid-19 Vaccine Bivalent Booster 38yrs & up 09/19/2021   Pneumococcal Conjugate-13 09/11/2014   Pneumococcal Polysaccharide-23 06/13/2013   Td 09/04/1993, 10/04/2009    TDAP status: Up to date  Flu Vaccine status: Due, Education has been provided regarding the importance of this vaccine. Advised may receive this vaccine at local pharmacy or Health Dept. Aware to provide a copy of the vaccination record if obtained from local pharmacy or Health Dept. Verbalized acceptance and understanding. Scheduled for Friday at pharmacy  Pneumococcal vaccine status: Up to date  Covid-19 vaccine status: Completed vaccines  Qualifies for Shingles Vaccine? Yes   Zostavax completed Yes  #1 this month Shingrix Completed?: No.    Education has been provided regarding the importance of this vaccine. Patient has been advised to call insurance company to determine out of pocket expense if they have not yet received this vaccine. Advised may also receive vaccine at local pharmacy or Health Dept. Verbalized acceptance and understanding.  Screening Tests Health Maintenance  Topic Date Due   Zoster Vaccines- Shingrix (1 of 2) Never done   FOOT EXAM  10/03/2022   INFLUENZA VACCINE  04/05/2023   COVID-19 Vaccine (5 - 2023-24 season) 05/06/2023   HEMOGLOBIN A1C  06/09/2023   Diabetic kidney evaluation - Urine ACR  08/08/2023   MAMMOGRAM  08/10/2023   OPHTHALMOLOGY EXAM  09/02/2023   Diabetic kidney evaluation - eGFR measurement  12/08/2023   Medicare Annual Wellness (AWV)  08/08/2024   Pneumonia Vaccine 69+ Years old  Completed   DEXA SCAN  Completed   HPV VACCINES  Aged Out   DTaP/Tdap/Td  Discontinued    Health Maintenance  Health Maintenance Due  Topic Date Due   Zoster Vaccines- Shingrix (1 of 2) Never done   FOOT EXAM  10/03/2022   INFLUENZA VACCINE  04/05/2023   COVID-19 Vaccine (5 - 2023-24 season) 05/06/2023   HEMOGLOBIN A1C  06/09/2023   Diabetic kidney evaluation - Urine ACR  08/08/2023     Colorectal cancer screening: No longer required.   Mammogram status: No longer required due to age.  Bone Density status: Completed 06/22/2021. Results reflect: Bone density results: OSTEOPOROSIS. Repeat every 3 years.  Lung Cancer Screening: (Low Dose CT Chest recommended if Age 73-80 years, 20 pack-year currently smoking OR have quit w/in 15years.) does not qualify.   Lung Cancer Screening Referral: no  Additional Screening:  Hepatitis C Screening: does not qualify; Completed no  Vision Screening: Recommended annual ophthalmology exams for early detection of glaucoma and other disorders of the eye. Is the patient up to date with their annual eye exam?  Yes  Who is the provider or what is the name of the office in which the patient attends annual eye exams? Dr Druscilla Brownie If pt is not established with a provider, would they like to be referred to a provider to establish care? No .   Dental Screening: Recommended annual dental exams for proper oral hygiene  Diabetic Foot Exam: Diabetic Foot Exam: Overdue, Pt has been advised about the importance in completing this exam. Pt is scheduled for diabetic foot exam on next visit in December 24.  Community Resource Referral / Chronic Care Management: CRR required this visit?  No   CCM required this visit?  No     Plan:     I have personally reviewed and noted the following in the patient's chart:   Medical and social history Use of alcohol, tobacco or illicit drugs  Current medications and supplements including opioid prescriptions. Patient is not currently taking opioid prescriptions. Functional ability and status Nutritional status Physical activity Advanced directives List of other physicians Hospitalizations, surgeries, and ER visits in previous 12 months Vitals Screenings to include cognitive, depression, and falls Referrals and appointments  In addition, I have reviewed and discussed with patient certain preventive  protocols, quality metrics, and best practice recommendations. A written personalized care plan for preventive services as well as general preventive health recommendations were provided to patient.    Sue Lush, LPN   [id number]   After Visit Summary: (Declined) Due to this being a telephonic visit, with patients personalized plan was offered to patient but patient Declined AVS at this time   Nurse Notes: The patient states doing well. She relays she cannot hear very well and needed hearing test. She has no other concerns or questions at this time.   *Audiology referral placed

## 2023-08-09 NOTE — Patient Instructions (Signed)
Kathy Cunningham , Thank you for taking time to come for your Medicare Wellness Visit. I appreciate your ongoing commitment to your health goals. Please review the following plan we discussed and let me know if I can assist you in the future.   Referrals/Orders/Follow-Ups/Clinician Recommendations:   Patient complains of difficulty with hearing. ENT referral placed. Patient is in agreement with treatment plan. Aware that the office will call with an appointment.    This is a list of the screening recommended for you and due dates:  Health Maintenance  Topic Date Due   Zoster (Shingles) Vaccine (1 of 2) Never done   Complete foot exam   10/03/2022   Flu Shot  04/05/2023   COVID-19 Vaccine (5 - 2023-24 season) 05/06/2023   Hemoglobin A1C  06/09/2023   Yearly kidney health urinalysis for diabetes  08/08/2023   Mammogram  08/10/2023   Eye exam for diabetics  09/02/2023   Yearly kidney function blood test for diabetes  12/08/2023   Medicare Annual Wellness Visit  08/08/2024   Pneumonia Vaccine  Completed   DEXA scan (bone density measurement)  Completed   HPV Vaccine  Aged Out   DTaP/Tdap/Td vaccine  Discontinued    Advanced directives: (Provided) Advance directive discussed with you today. I have provided a copy for you to complete at home and have notarized. Once this is complete, please bring a copy in to our office so we can scan it into your chart.  Pt says she has the paperwork  Next Medicare Annual Wellness Visit scheduled for next year: Yes 08/11/2024 @ 8:10am telephone

## 2023-08-09 NOTE — Addendum Note (Signed)
Addended by: Vincenza Hews on: 08/09/2023 10:01 AM   Modules accepted: Orders

## 2023-08-13 ENCOUNTER — Ambulatory Visit
Admission: RE | Admit: 2023-08-13 | Discharge: 2023-08-13 | Disposition: A | Discharge status: Home / Self Care | Payer: Medicare Other | Source: Ambulatory Visit | Attending: Family Medicine | Admitting: Family Medicine

## 2023-08-13 DIAGNOSIS — Z1231 Encounter for screening mammogram for malignant neoplasm of breast: Secondary | ICD-10-CM

## 2023-08-16 ENCOUNTER — Other Ambulatory Visit: Payer: Self-pay | Admitting: Family Medicine

## 2023-08-17 ENCOUNTER — Other Ambulatory Visit: Payer: Self-pay | Admitting: Family Medicine

## 2023-08-21 ENCOUNTER — Ambulatory Visit: Payer: Medicare Other | Admitting: Family Medicine

## 2023-08-21 ENCOUNTER — Encounter: Payer: Self-pay | Admitting: Family Medicine

## 2023-08-21 VITALS — BP 125/60 | HR 77 | Temp 97.5°F | Ht 59.5 in | Wt 113.1 lb

## 2023-08-21 DIAGNOSIS — E1169 Type 2 diabetes mellitus with other specified complication: Secondary | ICD-10-CM

## 2023-08-21 DIAGNOSIS — D568 Other thalassemias: Secondary | ICD-10-CM

## 2023-08-21 DIAGNOSIS — I10 Essential (primary) hypertension: Secondary | ICD-10-CM

## 2023-08-21 DIAGNOSIS — H903 Sensorineural hearing loss, bilateral: Secondary | ICD-10-CM

## 2023-08-21 DIAGNOSIS — F419 Anxiety disorder, unspecified: Secondary | ICD-10-CM

## 2023-08-21 DIAGNOSIS — E559 Vitamin D deficiency, unspecified: Secondary | ICD-10-CM

## 2023-08-21 DIAGNOSIS — Z0000 Encounter for general adult medical examination without abnormal findings: Secondary | ICD-10-CM | POA: Diagnosis not present

## 2023-08-21 DIAGNOSIS — E876 Hypokalemia: Secondary | ICD-10-CM

## 2023-08-21 DIAGNOSIS — Z7984 Long term (current) use of oral hypoglycemic drugs: Secondary | ICD-10-CM

## 2023-08-21 DIAGNOSIS — B370 Candidal stomatitis: Secondary | ICD-10-CM

## 2023-08-21 DIAGNOSIS — R438 Other disturbances of smell and taste: Secondary | ICD-10-CM

## 2023-08-21 DIAGNOSIS — E785 Hyperlipidemia, unspecified: Secondary | ICD-10-CM | POA: Diagnosis not present

## 2023-08-21 DIAGNOSIS — E119 Type 2 diabetes mellitus without complications: Secondary | ICD-10-CM

## 2023-08-21 DIAGNOSIS — D649 Anemia, unspecified: Secondary | ICD-10-CM

## 2023-08-21 DIAGNOSIS — Z1231 Encounter for screening mammogram for malignant neoplasm of breast: Secondary | ICD-10-CM

## 2023-08-21 DIAGNOSIS — M810 Age-related osteoporosis without current pathological fracture: Secondary | ICD-10-CM

## 2023-08-21 DIAGNOSIS — I739 Peripheral vascular disease, unspecified: Secondary | ICD-10-CM

## 2023-08-21 LAB — CBC WITH DIFFERENTIAL/PLATELET
Basophils Absolute: 0.1 10*3/uL (ref 0.0–0.1)
Basophils Relative: 1.1 % (ref 0.0–3.0)
Eosinophils Absolute: 0.1 10*3/uL (ref 0.0–0.7)
Eosinophils Relative: 1.7 % (ref 0.0–5.0)
HCT: 31.7 % — ABNORMAL LOW (ref 36.0–46.0)
Hemoglobin: 10.5 g/dL — ABNORMAL LOW (ref 12.0–15.0)
Lymphocytes Relative: 28.5 % (ref 12.0–46.0)
Lymphs Abs: 1.5 10*3/uL (ref 0.7–4.0)
MCHC: 33.1 g/dL (ref 30.0–36.0)
MCV: 94.4 fL (ref 78.0–100.0)
Monocytes Absolute: 0.6 10*3/uL (ref 0.1–1.0)
Monocytes Relative: 10.7 % (ref 3.0–12.0)
Neutro Abs: 3 10*3/uL (ref 1.4–7.7)
Neutrophils Relative %: 58 % (ref 43.0–77.0)
Platelets: 258 10*3/uL (ref 150.0–400.0)
RBC: 3.36 Mil/uL — ABNORMAL LOW (ref 3.87–5.11)
RDW: 13.8 % (ref 11.5–15.5)
WBC: 5.2 10*3/uL (ref 4.0–10.5)

## 2023-08-21 LAB — MICROALBUMIN / CREATININE URINE RATIO
Creatinine,U: 29.8 mg/dL
Microalb Creat Ratio: 18.2 mg/g (ref 0.0–30.0)
Microalb, Ur: 5.4 mg/dL — ABNORMAL HIGH (ref 0.0–1.9)

## 2023-08-21 LAB — LIPID PANEL
Cholesterol: 151 mg/dL (ref 0–200)
HDL: 70.8 mg/dL (ref >39.00)
LDL Cholesterol: 67 mg/dL (ref 0–99)
NonHDL: 80.19
Total CHOL/HDL Ratio: 2
Triglycerides: 65 mg/dL (ref 0.0–149.0)
VLDL: 13 mg/dL (ref 0.0–40.0)

## 2023-08-21 LAB — COMPREHENSIVE METABOLIC PANEL
ALT: 8 U/L (ref 0–35)
AST: 20 U/L (ref 0–37)
Albumin: 4.3 g/dL (ref 3.5–5.2)
Alkaline Phosphatase: 51 U/L (ref 39–117)
BUN: 21 mg/dL (ref 6–23)
CO2: 28 meq/L (ref 19–32)
Calcium: 9.8 mg/dL (ref 8.4–10.5)
Chloride: 102 meq/L (ref 96–112)
Creatinine, Ser: 1.12 mg/dL (ref 0.40–1.20)
GFR: 44.75 mL/min — ABNORMAL LOW (ref >60.00)
Glucose, Bld: 79 mg/dL (ref 70–99)
Potassium: 4.3 meq/L (ref 3.5–5.1)
Sodium: 140 meq/L (ref 135–145)
Total Bilirubin: 0.7 mg/dL (ref 0.2–1.2)
Total Protein: 6.8 g/dL (ref 6.0–8.3)

## 2023-08-21 LAB — HEMOGLOBIN A1C: Hgb A1c MFr Bld: 6.3 % (ref 4.6–6.5)

## 2023-08-21 LAB — VITAMIN D 25 HYDROXY (VIT D DEFICIENCY, FRACTURES): VITD: 38.69 ng/mL (ref 30.00–100.00)

## 2023-08-21 LAB — TSH: TSH: 0.71 u[IU]/mL (ref 0.35–5.50)

## 2023-08-21 MED ORDER — METOPROLOL TARTRATE 100 MG PO TABS: 100.0000 mg | ORAL_TABLET | Freq: Two times a day (BID) | ORAL | 3 refills | Status: DC

## 2023-08-21 MED ORDER — AMLODIPINE BESYLATE 10 MG PO TABS: 10.0000 mg | ORAL_TABLET | Freq: Every day | ORAL | 3 refills | Status: DC

## 2023-08-21 MED ORDER — ALENDRONATE SODIUM 70 MG PO TABS: 70.0000 mg | ORAL_TABLET | ORAL | 3 refills | Status: DC

## 2023-08-21 MED ORDER — NYSTATIN 100000 UNIT/ML MT SUSP: 5.0000 mL | Freq: Three times a day (TID) | OROMUCOSAL | 0 refills | Status: DC

## 2023-08-21 MED ORDER — METFORMIN HCL 500 MG PO TABS: 500.0000 mg | ORAL_TABLET | Freq: Two times a day (BID) | ORAL | 3 refills | Status: DC

## 2023-08-21 MED ORDER — OLMESARTAN MEDOXOMIL 40 MG PO TABS: 40.0000 mg | ORAL_TABLET | Freq: Every day | ORAL | 3 refills | Status: DC

## 2023-08-21 NOTE — Patient Instructions (Addendum)
Take the nystatin solution for thrush as directed   I will check in regarding the audiology evaluation   Think about some indoor exercise  Add some strength training to your routine, this is important for bone and brain health and can reduce your risk of falls and help your body use insulin properly and regulate weight  Light weights, exercise bands , and internet videos are a good way to start  Yoga (chair or regular), machines , floor exercises or a gym with machines are also good options    Follow up after the holidays about anxiety / labs and thrush re check     Make sure you get protein with every meal and snack  The following are examples of protein in diet  Meat  Fish  Eggs  Dairy products  Soy products  Oat milk  Almond milk Nuts and nut butters  Dried beans  Legumes      Call to schedule your bone density test  You have an order for:  []   2D Mammogram  []   3D Mammogram  [x]   Bone Density     Please call for appointment:   []   [facility]  [address] [place] [address]  [phone]  []   [facility])   [address]  [place], [place] [address]  [phone]  []   [facility]      [address] [place], [place]        [phone]         []   [facility]  [address] [place], [place]  [phone]  [x]  [facility]   [address]   [place], [place] [address]  [phone]  []  [facility]  [address] [place], [place] [address] [phone]    Make sure to wear two piece clothing  No lotions powders or deodorants the day of the appointment Make sure to bring picture ID and insurance card.  Bring list of medications you are currently taking including any supplements.   Schedule your  screening mammogram through MyChart!   Select [facility] imaging sites can now be scheduled through MyChart.  Log into your MyChart account.  Go to 'Visit' (or 'Appointments' if  on mobile App) --> Schedule an  Appointment  Under 'Select a Reason for Visit' choose the Mammogram  Screening option.  Complete the pre-visit questions  and select the time and place that  best fits your schedule

## 2023-08-21 NOTE — Assessment & Plan Note (Signed)
Had mammogram recent Declines breast exam in office

## 2023-08-21 NOTE — Assessment & Plan Note (Signed)
D level today  In setting of op

## 2023-08-21 NOTE — Assessment & Plan Note (Signed)
Reviewed health habits including diet and exercise and skin cancer prevention Reviewed appropriate screening tests for age  Also reviewed health mt list, fam hx and immunization status , as well as social and family history   See HPI Labs reviewed and ordered Declines breast exam  Lab today  Had shingrix 2 weeks ago  Plans ot get flu shot  Eye exam planned Friday  Dexa ordered  Discussed fall prevention, supplements and exercise for bone density  PHQ elevated-planned follow up to address Health Maintenance  Topic Date Due   Hemoglobin A1C  06/09/2023   DEXA scan (bone density measurement)  06/23/2023   Yearly kidney health urinalysis for diabetes  08/08/2023   COVID-19 Vaccine (5 - 2024-25 season) 08/25/2023*   Zoster (Shingles) Vaccine (1 of 2) 11/07/2023*   Flu Shot  12/03/2023*   Eye exam for diabetics  09/02/2023   Complete foot exam   09/05/2023   Yearly kidney function blood test for diabetes  12/08/2023   Mammogram  08/12/2024   Medicare Annual Wellness Visit  08/20/2024   Pneumonia Vaccine  Completed   HPV Vaccine  Aged Out   DTaP/Tdap/Td vaccine  Discontinued  *Topic was postponed. The date shown is not the original due date.

## 2023-08-21 NOTE — Assessment & Plan Note (Signed)
Cbc today No clinical changes

## 2023-08-21 NOTE — Assessment & Plan Note (Signed)
Dexa ordered  Alendronate weekly since 07/2021  No falls or fracture Discussed fall prevention, supplements and exercise for bone density   D level today

## 2023-08-21 NOTE — Assessment & Plan Note (Signed)
Lab today.

## 2023-08-21 NOTE — Assessment & Plan Note (Signed)
A1c today  Continues metformin 500 mg bid Microalb ordered  Eye exam utd  Weight loss/ appetite is off Discussed need for more protein in diet   Treating thrush today

## 2023-08-21 NOTE — Assessment & Plan Note (Signed)
No symptoms since re vasc procedure

## 2023-08-21 NOTE — Assessment & Plan Note (Signed)
Affecting her driving and riding in car Worsening   Will follow up to discuss treatment options

## 2023-08-21 NOTE — Assessment & Plan Note (Signed)
Due to thalassemia  No clinical changes Lab today

## 2023-08-21 NOTE — Assessment & Plan Note (Signed)
May be due to thrush Will treatment with nystatin solution  Follow up for re check   May be adding to weight loss

## 2023-08-21 NOTE — Assessment & Plan Note (Signed)
Disc goals for lipids and reasons to control them Rev last labs with pt Rev low sat fat diet in detail  Labs today  Continues rosuvastatin 20 mg daily

## 2023-08-21 NOTE — Assessment & Plan Note (Signed)
bp in fair control at this time  BP Readings from Last 1 Encounters:  08/21/23 125/60   No changes needed Most recent labs reviewed  Disc lifstyle change with low sodium diet and exercise  Plan to continue Amlodipine 10 mg daily  Hctz 25 mg daily  Metoprolol 100 mg bid Olmesartan 40 mg daily   Lab today
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
## 2023-08-21 NOTE — Assessment & Plan Note (Signed)
White coating on tongue and off taste  Treat with nystatin  Follow up for re check

## 2023-08-21 NOTE — Assessment & Plan Note (Signed)
Was ref to audiology for further eval  Missed call  Phone # given to call back

## 2023-08-24 DIAGNOSIS — H401132 Primary open-angle glaucoma, bilateral, moderate stage: Secondary | ICD-10-CM | POA: Diagnosis not present

## 2023-08-24 DIAGNOSIS — D3121 Benign neoplasm of right retina: Secondary | ICD-10-CM | POA: Diagnosis not present

## 2023-08-24 DIAGNOSIS — B880 Other acariasis: Secondary | ICD-10-CM | POA: Diagnosis not present

## 2023-08-24 DIAGNOSIS — E119 Type 2 diabetes mellitus without complications: Secondary | ICD-10-CM | POA: Diagnosis not present

## 2023-08-24 LAB — HM DIABETES EYE EXAM

## 2023-09-21 ENCOUNTER — Encounter: Payer: Self-pay | Admitting: Family Medicine

## 2023-09-21 ENCOUNTER — Ambulatory Visit (INDEPENDENT_AMBULATORY_CARE_PROVIDER_SITE_OTHER): Payer: Medicare Other | Admitting: Family Medicine

## 2023-09-21 VITALS — BP 136/54 | HR 69 | Temp 97.7°F | Ht 59.5 in | Wt 116.0 lb

## 2023-09-21 DIAGNOSIS — D649 Anemia, unspecified: Secondary | ICD-10-CM | POA: Diagnosis not present

## 2023-09-21 DIAGNOSIS — E785 Hyperlipidemia, unspecified: Secondary | ICD-10-CM

## 2023-09-21 DIAGNOSIS — E119 Type 2 diabetes mellitus without complications: Secondary | ICD-10-CM

## 2023-09-21 DIAGNOSIS — R944 Abnormal results of kidney function studies: Secondary | ICD-10-CM | POA: Diagnosis not present

## 2023-09-21 DIAGNOSIS — F411 Generalized anxiety disorder: Secondary | ICD-10-CM | POA: Diagnosis not present

## 2023-09-21 DIAGNOSIS — E1169 Type 2 diabetes mellitus with other specified complication: Secondary | ICD-10-CM | POA: Diagnosis not present

## 2023-09-21 DIAGNOSIS — Z23 Encounter for immunization: Secondary | ICD-10-CM

## 2023-09-21 DIAGNOSIS — Z7984 Long term (current) use of oral hypoglycemic drugs: Secondary | ICD-10-CM

## 2023-09-21 DIAGNOSIS — F419 Anxiety disorder, unspecified: Secondary | ICD-10-CM

## 2023-09-21 DIAGNOSIS — I10 Essential (primary) hypertension: Secondary | ICD-10-CM

## 2023-09-21 DIAGNOSIS — N1832 Chronic kidney disease, stage 3b: Secondary | ICD-10-CM | POA: Insufficient documentation

## 2023-09-21 MED ORDER — ESCITALOPRAM OXALATE 5 MG PO TABS: 5.0000 mg | ORAL_TABLET | Freq: Every day | ORAL | 5 refills | Status: DC

## 2023-09-21 NOTE — Patient Instructions (Addendum)
I put the referral in for mental health counseling for anxiety  Please let us know if you don't hear in 1-2 weeks   Start lexapro 5 mg -take in the evening  If any intolerable side effects or if you feel worse - please hold it and let us know  A little nausea or fuzzy feeling for the first week is normal   Get out  Stay active Socialize  Eat well  Take care of yourself   Flu shot today  Follow up in 6 months or earlier if needed

## 2023-09-21 NOTE — Assessment & Plan Note (Signed)
bp in fair control at this time  BP Readings from Last 1 Encounters:  09/21/23 (!) 136/54   No changes needed Most recent labs reviewed  Disc lifstyle change with low sodium diet and exercise  Plan to continue Amlodipine 10 mg daily  Hctz 25 mg daily  Metoprolol 100 mg bid Olmesartan 40 mg daily   Bmet with GFR of 44.7 Encouraged good water intake  If this goes down further consider change hydrochlorothiazide  Want to check at next f/u

## 2023-09-21 NOTE — Assessment & Plan Note (Signed)
Reviewed stressors/ coping techniques/symptoms/ support sources/ tx options and side effects in detail today Having symptoms of anxiety driving and riding in cars-becoming more severe and causing issues with day to day function  Reassuring exam  Discussed treatment options  Will try lexapro 5 mg  Discussed expectations of SSRI medication including time to effectiveness and mechanism of action, also poss of side effects (early and late)- including mental fuzziness, weight or appetite change, nausea and poss of worse dep or anxiety (even suicidal thoughts)  Pt voiced understanding and will stop med and update if this occurs   Follow up 6 mo or earlier if needed If helpful will have opt to increase cose   Encouraged self care/socialization and treatment of hearing loss Encouraged physical activity as tolerated   Ref for [facility] counseling as well

## 2023-09-21 NOTE — Assessment & Plan Note (Signed)
Lab Results  Component Value Date   HGBA1C 6.3 08/21/2023   HGBA1C 6.3 12/08/2022   HGBA1C 6.3 08/07/2022   Stable with low dose metformin (500) mg bid  If GFR goes down further may have to change Encouraged low glycmeic diet and exercise as tolerated Microalb normal range   On statin and arb

## 2023-09-21 NOTE — Assessment & Plan Note (Signed)
Disc goals for lipids and reasons to control them Rev last labs with pt Rev low sat fat diet in detail  LDL of 67 with HDL over 70 Good control Continues rosuvastatin 20 mg daily

## 2023-09-21 NOTE — Assessment & Plan Note (Signed)
Lab Results  Component Value Date   WBC 5.2 08/21/2023   HGB 10.5 (L) 08/21/2023   HCT 31.7 (L) 08/21/2023   MCV 94.4 08/21/2023   PLT 258.0 08/21/2023   Stable with thalassemia

## 2023-09-21 NOTE — Progress Notes (Signed)
Subjective:    Patient ID: Kathy Cunningham, female    DOB: Jan 13, 1936, 88 y.o.   MRN: [id number]  HPI  Wt Readings from Last 3 Encounters:  09/21/23 116 lb (52.6 kg)  08/21/23 113 lb 2 oz (51.3 kg)  08/09/23 119 lb (54 kg)   23.04 kg/m  Vitals:   09/21/23 1016  BP: (!) 136/54  Pulse: 69  Temp: 97.7 F (36.5 C)  SpO2: 98%    Pt presents for follow up of anxious mood and chronic medical problems incl HTN and DM   HTN bp is stable today  No cp or palpitations or headaches or edema  No side effects to medicines  BP Readings from Last 3 Encounters:  09/21/23 (!) 136/54  08/21/23 125/60  07/02/23 (!) 156/64    Amlodipine 10 mg daily  Hctz 25 mg daily  Metoprolol 100 mg bid Olmesartan 40 mg daily   History of PAD with vascular procedure 12/2022 in [place] on LLE Takes plavix   DM2 Lab Results  Component Value Date   HGBA1C 6.3 08/21/2023   HGBA1C 6.3 12/08/2022   HGBA1C 6.3 08/07/2022   Metformin 500 mg bid   Lab Results  Component Value Date   MICROALBUR 5.4 (H) 08/21/2023   MICROALBUR 13.3 (H) 08/07/2022   Lab Results  Component Value Date   NA 140 08/21/2023   K 4.3 08/21/2023   CO2 28 08/21/2023   GLUCOSE 79 08/21/2023   BUN 21 08/21/2023   CREATININE 1.12 08/21/2023   CALCIUM 9.8 08/21/2023   GFR 44.75 (L) 08/21/2023   GFRNONAA 50 (L) 02/01/2021   Lab Results  Component Value Date   ALT 8 08/21/2023   AST 20 08/21/2023   ALKPHOS 51 08/21/2023   BILITOT 0.7 08/21/2023      Hyperlipidemia Lab Results  Component Value Date   CHOL 151 08/21/2023   HDL 70.80 08/21/2023   LDLCALC 67 08/21/2023   TRIG 65.0 08/21/2023   CHOLHDL 2 08/21/2023  Rosuvastatin 20 mg daily   Osteoporosis Falls  Fracture  Dexa  Alendronate weekly since 07/2021   Chronic anemia from thalassemia  Lab Results  Component Value Date   WBC 5.2 08/21/2023   HGB 10.5 (L) 08/21/2023   HCT 31.7 (L) 08/21/2023   MCV 94.4 08/21/2023   PLT 258.0 08/21/2023      Mood  Anxiety is worse lately  Has xanax for emergencies Driving and riding in a car is problematic   When anxious she feels jittery  Does affect memory but not concentration  Sometimes makes her have urgency to go to bathroom  Has had a panic attack several times  Usually when driving  Had to go park   Has always been anxious to drive  Worse lately -since retirement  Now not only when she drive but when she rides   Not really driving   No accidents or injuries   Is always worried about her grandsons   Goes to bed early  Sometimes wakes up early and cannot go back to sleep Appetite-family thinks not as good          09/21/2023   10:26 AM 08/21/2023   10:21 AM 08/09/2023    8:31 AM 08/04/2022    8:54 AM 03/29/2021   11:35 AM  Depression screen PHQ 2/9  Decreased Interest 0 2 0 1 0  Down, Depressed, Hopeless 0 2 1 0 0  PHQ - 2 Score 0 4 1 1  0  Altered sleeping 0 0     Tired, decreased energy 3 2     Change in appetite 3 2     Feeling bad or failure about yourself  0 0     Trouble concentrating 3 0     Moving slowly or fidgety/restless 2 2     Suicidal thoughts 0 0     PHQ-9 Score 11 10     Difficult doing work/chores Somewhat difficult Somewhat difficult         09/21/2023   10:27 AM 08/21/2023   10:21 AM  GAD 7 : Generalized Anxiety Score  Nervous, Anxious, on Edge 1 2  Control/stop worrying 2 2  Worry too much - different things 2 0  Trouble relaxing 1 0  Restless 0 0  Easily annoyed or irritable 3 3  Afraid - awful might happen 1 0  Total GAD 7 Score 10 7  Anxiety Difficulty Somewhat difficult Somewhat difficult       Patient Active Problem List   Diagnosis Date Noted   Decreased GFR 09/21/2023   PVD (peripheral vascular disease) (HCC) 12/08/2022   Ingrown nail of great toe 12/08/2022   Hearing loss 10/22/2019   Tinnitus aurium, right 09/26/2019   Routine general medical examination at a health care facility 06/15/2015   Estrogen  deficiency 06/15/2015   Breast cancer screening 12/17/2013   Encounter for screening mammogram for breast cancer 04/24/2011   Glaucoma 04/24/2011   Vitamin D deficiency 04/24/2011   GAD (generalized anxiety disorder) 10/20/2010   HYPOKALEMIA 10/22/2009   Other thalassemia (HCC)    Osteoporosis 05/01/2008   Diabetes mellitus treated with oral medication (HCC) 07/17/2007   Hyperlipidemia associated with type 2 diabetes mellitus (HCC) 07/17/2007   Anemia 07/17/2007   Essential hypertension 07/17/2007   PREMATURE VENTRICULAR CONTRACTIONS, FREQUENT 07/17/2007   UTI'S, RECURRENT 07/17/2007   Past Medical History:  Diagnosis Date   Anemia    Arthritis    hands   Diabetes mellitus    type II   Dizziness    or vertigo   Glaucoma    Hyperlipidemia    Hypertension    Posterior auricular lymphadenopathy    Right - "comes and goes"   Past Surgical History:  Procedure Laterality Date   bladder papilloma  2006   CATARACT EXTRACTION W/PHACO Left 01/17/2022   Procedure: CATARACT EXTRACTION PHACO AND INTRAOCULAR LENS PLACEMENT (IOC) LEFT DIABETIC RAYNOR LENS;  Surgeon: Galen Manila, MD;  Location: [facility];  Service: Ophthalmology;  Laterality: Left;  Diabetic 19.50 02:06.5   CATARACT EXTRACTION W/PHACO Right 01/31/2022   Procedure: CATARACT EXTRACTION PHACO AND INTRAOCULAR LENS PLACEMENT (IOC) RIGHT DIABETIC RAYNOR LENS;  Surgeon: Galen Manila, MD;  Location: [facility];  Service: Ophthalmology;  Laterality: Right;  10.55 0:56.4   ROTATOR CUFF REPAIR  01/2002   vaginal hysterectomy     Social History   Tobacco Use   Smoking status: Never   Smokeless tobacco: Never  Vaping Use   Vaping status: Never Used  Substance Use Topics   Alcohol use: No    Alcohol/week: 0.0 standard drinks of alcohol   Drug use: No   Family History  Problem Relation Age of Onset   Heart disease Mother        MI   Heart attack Mother    Hypertension Mother    Heart disease  Father        MI   Heart attack Father    Hypertension Father  Cancer Brother        pancreatic    Allergies  Allergen Reactions   Atorvastatin Other (See Comments)    REACTION: cramps   Risedronate Sodium Nausea And Vomiting and Other (See Comments)   Sulfonamide Derivatives Rash   Current Outpatient Medications on File Prior to Visit  Medication Sig Dispense Refill   Accu-Chek Softclix Lancets lancets Use as instructed 100 each 3   alendronate (FOSAMAX) 70 MG tablet Take 1 tablet (70 mg total) by mouth every 7 (seven) days. Take with a full glass of water on an empty stomach. 12 tablet 3   ALPRAZolam (XANAX) 0.5 MG tablet 1/2 to 1 pill by mouth twice daily as needed for anxiety with travel, caution of sedation 10 tablet 0   amLODipine (NORVASC) 10 MG tablet Take 1 tablet (10 mg total) by mouth daily. 100 tablet 3   aspirin EC 81 MG tablet Take 81 mg by mouth daily. Swallow whole.     Cal Carb-Mag Hydrox-Simeth (ROLAIDS ADVANCED PO) Take 1 tablet by mouth as needed (indigestion).     calcium carbonate (TUMS - DOSED IN MG ELEMENTAL CALCIUM) 500 MG chewable tablet Chew 1 tablet by mouth daily as needed for indigestion or heartburn.     cholecalciferol (VITAMIN D) 1000 UNITS tablet Take 2,000 Units by mouth daily.      clopidogrel (PLAVIX) 75 MG tablet Take 1 tablet (75 mg total) by mouth daily. 1 tablet 0   cyclobenzaprine (FLEXERIL) 10 MG tablet Take 1 tablet (10 mg total) by mouth 2 (two) times daily as needed for muscle spasms. 60 tablet 0   glucose blood (ACCU-CHEK GUIDE) test strip Use as instructed 100 each 3   hydrochlorothiazide (HYDRODIURIL) 25 MG tablet TAKE 1 TABLET BY MOUTH DAILY 100 tablet 2   latanoprost (XALATAN) 0.005 % ophthalmic solution Place 1 drop into both eyes at bedtime.     Lotilaner (XDEMVY) 0.25 % SOLN Apply 1 drop to eye in the morning and at bedtime.     metFORMIN (GLUCOPHAGE) 500 MG tablet Take 1 tablet (500 mg total) by mouth 2 (two) times daily with a  meal. 200 tablet 3   metoprolol tartrate (LOPRESSOR) 100 MG tablet Take 1 tablet (100 mg total) by mouth 2 (two) times daily. 200 tablet 3   nystatin (MYCOSTATIN) 100000 UNIT/ML suspension Take 5 mLs (500,000 Units total) by mouth 3 (three) times daily. Swish and swallow 120 mL 0   olmesartan (BENICAR) 40 MG tablet Take 1 tablet (40 mg total) by mouth daily. 100 tablet 3   potassium chloride SA (KLOR-CON M) 20 MEQ tablet TAKE 1 TABLET BY MOUTH DAILY 100 tablet 0   rosuvastatin (CRESTOR) 20 MG tablet TAKE 1 TABLET BY MOUTH DAILY AT  NOON 100 tablet 0   No current facility-administered medications on file prior to visit.    Review of Systems  Constitutional:  Positive for appetite change. Negative for activity change, fatigue, fever and unexpected weight change.  HENT:  Negative for congestion, ear pain, rhinorrhea, sinus pressure and sore throat.   Eyes:  Negative for pain, redness and visual disturbance.  Respiratory:  Negative for cough, shortness of breath and wheezing.   Cardiovascular:  Negative for chest pain and palpitations.  Gastrointestinal:  Negative for abdominal pain, blood in stool, constipation and diarrhea.  Endocrine: Negative for polydipsia and polyuria.  Genitourinary:  Negative for dysuria, frequency and urgency.  Musculoskeletal:  Negative for arthralgias, back pain and myalgias.  Skin:  Negative for  pallor and rash.  Allergic/Immunologic: Negative for environmental allergies.  Neurological:  Negative for dizziness, syncope and headaches.  Hematological:  Negative for adenopathy. Does not bruise/bleed easily.  Psychiatric/Behavioral:  Positive for sleep disturbance. Negative for decreased concentration, dysphoric mood, self-injury and suicidal ideas. The patient is nervous/anxious.        Objective:   Physical Exam Constitutional:      General: She is not in acute distress.    Appearance: Normal appearance. She is well-developed and normal weight. She is not  ill-appearing or diaphoretic.  HENT:     Head: Normocephalic and atraumatic.     Mouth/Throat:     Mouth: Mucous membranes are moist.  Eyes:     Conjunctiva/sclera: Conjunctivae normal.     Pupils: Pupils are equal, round, and reactive to light.  Neck:     Thyroid: No thyromegaly.     Vascular: No carotid bruit or JVD.  Cardiovascular:     Rate and Rhythm: Normal rate and regular rhythm.     Heart sounds: Normal heart sounds.     No gallop.  Pulmonary:     Effort: Pulmonary effort is normal. No respiratory distress.     Breath sounds: Normal breath sounds. No wheezing or rales.  Abdominal:     General: There is no distension or abdominal bruit.     Palpations: Abdomen is soft.  Musculoskeletal:     Cervical back: Normal range of motion and neck supple.     Right lower leg: No edema.     Left lower leg: No edema.  Lymphadenopathy:     Cervical: No cervical adenopathy.  Skin:    General: Skin is warm and dry.     Coloration: Skin is not pale.     Findings: No rash.  Neurological:     Mental Status: She is alert.     Sensory: No sensory deficit.     Coordination: Coordination normal.     Deep Tendon Reflexes: Reflexes are normal and symmetric. Reflexes normal.  Psychiatric:        Attention and Perception: Attention normal.        Mood and Affect: Mood is anxious. Affect is not flat or tearful.        Speech: Speech normal.        Behavior: Behavior normal.        Cognition and Memory: Cognition and memory normal.     Comments: Candidly discusses symptoms and stressors  Fam member helps with history              Assessment & Plan:   Problem List Items Addressed This Visit       Cardiovascular and Mediastinum   Essential hypertension   bp in fair control at this time  BP Readings from Last 1 Encounters:  09/21/23 (!) 136/54   No changes needed Most recent labs reviewed  Disc lifstyle change with low sodium diet and exercise  Plan to continue Amlodipine  10 mg daily  Hctz 25 mg daily  Metoprolol 100 mg bid Olmesartan 40 mg daily   Bmet with GFR of 44.7 Encouraged good water intake  If this goes down further consider change hydrochlorothiazide  Want to check at next f/u        Endocrine   Hyperlipidemia associated with type 2 diabetes mellitus (HCC)   Disc goals for lipids and reasons to control them Rev last labs with pt Rev low sat fat diet in detail  LDL of 67  with HDL over 70 Good control Continues rosuvastatin 20 mg daily      Diabetes mellitus treated with oral medication (HCC)   Lab Results  Component Value Date   HGBA1C 6.3 08/21/2023   HGBA1C 6.3 12/08/2022   HGBA1C 6.3 08/07/2022   Stable with low dose metformin (500) mg bid  If GFR goes down further may have to change Encouraged low glycmeic diet and exercise as tolerated Microalb normal range   On statin and arb        Other   GAD (generalized anxiety disorder) - Primary   Reviewed stressors/ coping techniques/symptoms/ support sources/ tx options and side effects in detail today Having symptoms of anxiety driving and riding in cars-becoming more severe and causing issues with day to day function  Reassuring exam  Discussed treatment options  Will try lexapro 5 mg  Discussed expectations of SSRI medication including time to effectiveness and mechanism of action, also poss of side effects (early and late)- including mental fuzziness, weight or appetite change, nausea and poss of worse dep or anxiety (even suicidal thoughts)  Pt voiced understanding and will stop med and update if this occurs   Follow up 6 mo or earlier if needed If helpful will have opt to increase cose   Encouraged self care/socialization and treatment of hearing loss Encouraged physical activity as tolerated   Ref for [facility] counseling as well       Relevant Medications   escitalopram (LEXAPRO) 5 MG tablet   Other Relevant Orders   Ambulatory referral to Psychology   Decreased GFR    GFR of 44.7 in 88 yo with HTN and DM She does take both hydrochlorothiazide and metformin low dose Encouraged fluid intake   Consider med change if needed Urine micro ratio normal  On arb as well  No symptoms of uti       Anemia   Lab Results  Component Value Date   WBC 5.2 08/21/2023   HGB 10.5 (L) 08/21/2023   HCT 31.7 (L) 08/21/2023   MCV 94.4 08/21/2023   PLT 258.0 08/21/2023   Stable with thalassemia        Other Visit Diagnoses       Need for influenza vaccination       Relevant Orders   Flu Vaccine Trivalent High Dose (Fluad) (Completed)

## 2023-09-21 NOTE — Assessment & Plan Note (Signed)
GFR of 44.7 in 88 yo with HTN and DM She does take both hydrochlorothiazide and metformin low dose Encouraged fluid intake   Consider med change if needed Urine micro ratio normal  On arb as well  No symptoms of uti

## 2023-09-27 ENCOUNTER — Encounter: Payer: Self-pay | Admitting: Family Medicine

## 2023-10-13 ENCOUNTER — Other Ambulatory Visit: Payer: Self-pay | Admitting: Family Medicine

## 2023-10-16 ENCOUNTER — Other Ambulatory Visit: Payer: Self-pay | Admitting: Family Medicine

## 2023-10-28 ENCOUNTER — Other Ambulatory Visit: Payer: Self-pay | Admitting: Family Medicine

## 2023-11-11 ENCOUNTER — Other Ambulatory Visit: Payer: Self-pay | Admitting: Family Medicine

## 2023-11-20 ENCOUNTER — Other Ambulatory Visit: Payer: Self-pay | Admitting: Family Medicine

## 2023-12-04 ENCOUNTER — Telehealth: Payer: Self-pay | Admitting: Family Medicine

## 2023-12-04 NOTE — Telephone Encounter (Signed)
 I want to discuss a new medicine for diabetes that may help her kidney function  Her last urine protein calculation was elevated with the correction and this may help that and overall kidney function   Please schedule follow up appointment when able   Thanks

## 2023-12-04 NOTE — Telephone Encounter (Signed)
 Called patient she has been scheduled for Friday

## 2023-12-07 ENCOUNTER — Ambulatory Visit (INDEPENDENT_AMBULATORY_CARE_PROVIDER_SITE_OTHER): Admitting: Family Medicine

## 2023-12-07 ENCOUNTER — Encounter: Payer: Self-pay | Admitting: Family Medicine

## 2023-12-07 ENCOUNTER — Ambulatory Visit: Admitting: Family Medicine

## 2023-12-07 VITALS — BP 128/62 | HR 71 | Temp 98.0°F | Ht 59.5 in | Wt 113.5 lb

## 2023-12-07 DIAGNOSIS — R058 Other specified cough: Secondary | ICD-10-CM | POA: Insufficient documentation

## 2023-12-07 DIAGNOSIS — Z7984 Long term (current) use of oral hypoglycemic drugs: Secondary | ICD-10-CM

## 2023-12-07 DIAGNOSIS — I10 Essential (primary) hypertension: Secondary | ICD-10-CM | POA: Diagnosis not present

## 2023-12-07 DIAGNOSIS — E119 Type 2 diabetes mellitus without complications: Secondary | ICD-10-CM

## 2023-12-07 DIAGNOSIS — R944 Abnormal results of kidney function studies: Secondary | ICD-10-CM | POA: Diagnosis not present

## 2023-12-07 LAB — POCT GLYCOSYLATED HEMOGLOBIN (HGB A1C): Hemoglobin A1C: 6 % — AB (ref 4.0–5.6)

## 2023-12-07 MED ORDER — EMPAGLIFLOZIN 10 MG PO TABS: 10.0000 mg | ORAL_TABLET | Freq: Every day | ORAL | 1 refills | Status: DC

## 2023-12-07 NOTE — Assessment & Plan Note (Signed)
 Pt mentions she had flu or flu like illness 3 wk ago  Still mild cough/not too bothersome  Reassuring exam today / clear lung sounds   Instructed to call if symptoms worsen

## 2023-12-07 NOTE — Assessment & Plan Note (Signed)
 bp in fair control at this time  BP Readings from Last 1 Encounters:  12/07/23 128/62   No changes needed Most recent labs reviewed  Disc lifstyle change with low sodium diet and exercise  Plan to continue Amlodipine 10 mg daily  Hctz 25 mg daily  Metoprolol 100 mg bid Olmesartan 40 mg daily   Bmet with GFR of 44.7 Encouraged good water intake  If this goes down further consider change hydrochlorothiazide   Stopping metformin and replacing with low dose jardiance today

## 2023-12-07 NOTE — Patient Instructions (Addendum)
 Make sure to eat regularly  Get protein with every meal The following are examples of protein in diet  Meat  Fish  Eggs  Dairy products  Soy products  Oat milk  Almond milk Nuts and nut butters  Dried beans    Stop metformin   Start jardiance 10 mg daily   If you get any symptoms of uti or vaginal infection (yeast) let us know  Stay hydrated   Follow up in 3 months   If the cough worsens let us know

## 2023-12-07 NOTE — Assessment & Plan Note (Signed)
 Last GFR 44.75 Microalb re calc was high  Encouraged good hydration  Today d/c metformin  Will try jardiance 10 mg daily for DM (reviewed possible side effects)  Follow up 3 mo for visit and labs

## 2023-12-07 NOTE — Progress Notes (Signed)
 Subjective:    Patient ID: Kathy Cunningham, female    DOB: Jul 03, 1936, 88 y.o.   MRN: [id number]  HPI  Wt Readings from Last 3 Encounters:  12/07/23 113 lb 8 oz (51.5 kg)  09/21/23 116 lb (52.6 kg)  08/21/23 113 lb 2 oz (51.3 kg)   22.54 kg/m  Vitals:   12/07/23 1213  BP: 128/62  Pulse: 71  Temp: 98 F (36.7 C)  SpO2: 100%     Pt presents for follow up of DM2 and microalbuminuria , HTN and chronic medical problems  Had the flu 3 weeks ago -dry cough but could hear rattling  Had wheezing -did not come to doctor   HTN bp is stable today  No cp or palpitations or headaches or edema  No side effects to medicines  BP Readings from Last 3 Encounters:  12/07/23 128/62  09/21/23 (!) 136/54  08/21/23 125/60    Amlodipine 10 mg daily  Hctz 25 mg daily  Metoprolol 100 mg bid Olmesartan 40 mg daily    DM2 Lab Results  Component Value Date   HGBA1C 6.0 (A) 12/07/2023   HGBA1C 6.3 08/21/2023   HGBA1C 6.3 12/08/2022    Metformin 500 mg bid  Takes arb  Takes statin   Lab Results  Component Value Date   NA 140 08/21/2023   K 4.3 08/21/2023   CO2 28 08/21/2023   GLUCOSE 79 08/21/2023   BUN 21 08/21/2023   CREATININE 1.12 08/21/2023   CALCIUM 9.8 08/21/2023   GFR 44.75 (L) 08/21/2023   GFRNONAA 50 (L) 02/01/2021   Metformin and hydrochlorothiazide may impact the GFR   Lab Results  Component Value Date   WBC 5.2 08/21/2023   HGB 10.5 (L) 08/21/2023   HCT 31.7 (L) 08/21/2023   MCV 94.4 08/21/2023   PLT 258.0 08/21/2023   Has thalassemia as well  Lab Results  Component Value Date   MICROALBUR 5.4 (H) 08/21/2023   MICROALBUR 13.3 (H) 08/07/2022  Ratio (corrected) at that time was 182   Eye exam utd from [facility] retinopathy   Hyperlipidemia Lab Results  Component Value Date   CHOL 151 08/21/2023   HDL 70.80 08/21/2023   LDLCALC 67 08/21/2023   TRIG 65.0 08/21/2023   CHOLHDL 2 08/21/2023   Crestor 20   good control   History of pvd-with  re vascularization procedure in [place] about a year ago  On plavix     Patient Active Problem List   Diagnosis Date Noted   Post-viral cough syndrome 12/07/2023   Decreased GFR 09/21/2023   PVD (peripheral vascular disease) (HCC) 12/08/2022   Ingrown nail of great toe 12/08/2022   Hearing loss 10/22/2019   Tinnitus aurium, right 09/26/2019   Routine general medical examination at a health care facility 06/15/2015   Estrogen deficiency 06/15/2015   Breast cancer screening 12/17/2013   Encounter for screening mammogram for breast cancer 04/24/2011   Glaucoma 04/24/2011   Vitamin D deficiency 04/24/2011   GAD (generalized anxiety disorder) 10/20/2010   HYPOKALEMIA 10/22/2009   Other thalassemia (HCC)    Osteoporosis 05/01/2008   Diabetes mellitus treated with oral medication (HCC) 07/17/2007   Hyperlipidemia associated with type 2 diabetes mellitus (HCC) 07/17/2007   Anemia 07/17/2007   Essential hypertension 07/17/2007   PREMATURE VENTRICULAR CONTRACTIONS, FREQUENT 07/17/2007   UTI'S, RECURRENT 07/17/2007   Past Medical History:  Diagnosis Date   Anemia    Arthritis    hands   Diabetes mellitus  type II   Dizziness    or vertigo   Glaucoma    Hyperlipidemia    Hypertension    Posterior auricular lymphadenopathy    Right - "comes and goes"   Past Surgical History:  Procedure Laterality Date   bladder papilloma  2006   CATARACT EXTRACTION W/PHACO Left 01/17/2022   Procedure: CATARACT EXTRACTION PHACO AND INTRAOCULAR LENS PLACEMENT (IOC) LEFT DIABETIC RAYNOR LENS;  Surgeon: Galen Manila, MD;  Location: [facility];  Service: Ophthalmology;  Laterality: Left;  Diabetic 19.50 02:06.5   CATARACT EXTRACTION W/PHACO Right 01/31/2022   Procedure: CATARACT EXTRACTION PHACO AND INTRAOCULAR LENS PLACEMENT (IOC) RIGHT DIABETIC RAYNOR LENS;  Surgeon: Galen Manila, MD;  Location: [facility];  Service: Ophthalmology;  Laterality: Right;  10.55 0:56.4    ROTATOR CUFF REPAIR  01/2002   vaginal hysterectomy     Social History   Tobacco Use   Smoking status: Never   Smokeless tobacco: Never  Vaping Use   Vaping status: Never Used  Substance Use Topics   Alcohol use: No    Alcohol/week: 0.0 standard drinks of alcohol   Drug use: No   Family History  Problem Relation Age of Onset   Heart disease Mother        MI   Heart attack Mother    Hypertension Mother    Heart disease Father        MI   Heart attack Father    Hypertension Father    Cancer Brother        pancreatic    Allergies  Allergen Reactions   Atorvastatin Other (See Comments)    REACTION: cramps   Risedronate Sodium Nausea And Vomiting and Other (See Comments)   Sulfonamide Derivatives Rash   Current Outpatient Medications on File Prior to Visit  Medication Sig Dispense Refill   Accu-Chek Softclix Lancets lancets Use as instructed 100 each 3   alendronate (FOSAMAX) 70 MG tablet Take 1 tablet (70 mg total) by mouth every 7 (seven) days. Take with a full glass of water on an empty stomach. 12 tablet 3   ALPRAZolam (XANAX) 0.5 MG tablet 1/2 to 1 pill by mouth twice daily as needed for anxiety with travel, caution of sedation 10 tablet 0   amLODipine (NORVASC) 10 MG tablet Take 1 tablet (10 mg total) by mouth daily. 100 tablet 3   aspirin EC 81 MG tablet Take 81 mg by mouth daily. Swallow whole.     Cal Carb-Mag Hydrox-Simeth (ROLAIDS ADVANCED PO) Take 1 tablet by mouth as needed (indigestion).     calcium carbonate (TUMS - DOSED IN MG ELEMENTAL CALCIUM) 500 MG chewable tablet Chew 1 tablet by mouth daily as needed for indigestion or heartburn.     cholecalciferol (VITAMIN D) 1000 UNITS tablet Take 2,000 Units by mouth daily.      clopidogrel (PLAVIX) 75 MG tablet Take 1 tablet (75 mg total) by mouth daily. 1 tablet 0   cyclobenzaprine (FLEXERIL) 10 MG tablet Take 1 tablet (10 mg total) by mouth 2 (two) times daily as needed for muscle spasms. 60 tablet 0    escitalopram (LEXAPRO) 5 MG tablet TAKE 1 TABLET (5 MG TOTAL) BY MOUTH DAILY. 90 tablet 1   glucose blood (ACCU-CHEK GUIDE) test strip Use as instructed 100 each 3   hydrochlorothiazide (HYDRODIURIL) 25 MG tablet TAKE 1 TABLET BY MOUTH DAILY 100 tablet 1   latanoprost (XALATAN) 0.005 % ophthalmic solution Place 1 drop into both eyes at bedtime.  Lotilaner (XDEMVY) 0.25 % SOLN Apply 1 drop to eye in the morning and at bedtime.     metoprolol tartrate (LOPRESSOR) 100 MG tablet Take 1 tablet (100 mg total) by mouth 2 (two) times daily. 200 tablet 3   olmesartan (BENICAR) 40 MG tablet Take 1 tablet (40 mg total) by mouth daily. 100 tablet 3   potassium chloride SA (KLOR-CON M) 20 MEQ tablet TAKE 1 TABLET BY MOUTH DAILY 100 tablet 0   rosuvastatin (CRESTOR) 20 MG tablet TAKE 1 TABLET BY MOUTH DAILY AT  NOON 100 tablet 2   No current facility-administered medications on file prior to visit.    Review of Systems  Constitutional:  Negative for activity change, appetite change, fatigue, fever and unexpected weight change.  HENT:  Negative for congestion, ear pain, rhinorrhea, sinus pressure and sore throat.   Eyes:  Negative for pain, redness and visual disturbance.  Respiratory:  Positive for cough. Negative for shortness of breath and wheezing.   Cardiovascular:  Negative for chest pain and palpitations.  Gastrointestinal:  Negative for abdominal pain, blood in stool, constipation and diarrhea.  Endocrine: Negative for polydipsia and polyuria.  Genitourinary:  Negative for dysuria, frequency and urgency.  Musculoskeletal:  Negative for arthralgias, back pain and myalgias.  Skin:  Negative for pallor and rash.  Allergic/Immunologic: Negative for environmental allergies.  Neurological:  Negative for dizziness, syncope and headaches.  Hematological:  Negative for adenopathy. Does not bruise/bleed easily.  Psychiatric/Behavioral:  Negative for decreased concentration and dysphoric mood. The  patient is not nervous/anxious.        Objective:   Physical Exam Constitutional:      General: She is not in acute distress.    Appearance: Normal appearance. She is well-developed and normal weight. She is not ill-appearing or diaphoretic.  HENT:     Head: Normocephalic and atraumatic.  Eyes:     Conjunctiva/sclera: Conjunctivae normal.     Pupils: Pupils are equal, round, and reactive to light.  Neck:     Thyroid: No thyromegaly.     Vascular: No carotid bruit or JVD.  Cardiovascular:     Rate and Rhythm: Normal rate and regular rhythm.     Heart sounds: Normal heart sounds.     No gallop.  Pulmonary:     Effort: Pulmonary effort is normal. No respiratory distress.     Breath sounds: Normal breath sounds. No wheezing or rales.  Abdominal:     General: There is no distension or abdominal bruit.     Palpations: Abdomen is soft.  Musculoskeletal:     Cervical back: Normal range of motion and neck supple.     Right lower leg: No edema.     Left lower leg: No edema.  Lymphadenopathy:     Cervical: No cervical adenopathy.  Skin:    General: Skin is warm and dry.     Coloration: Skin is not pale.     Findings: No rash.  Neurological:     Mental Status: She is alert.     Coordination: Coordination normal.     Deep Tendon Reflexes: Reflexes are normal and symmetric. Reflexes normal.  Psychiatric:        Mood and Affect: Mood normal.           Assessment & Plan:   Problem List Items Addressed This Visit       Cardiovascular and Mediastinum   Essential hypertension   bp in fair control at this time  BP Readings  from Last 1 Encounters:  12/07/23 128/62   No changes needed Most recent labs reviewed  Disc lifstyle change with low sodium diet and exercise  Plan to continue Amlodipine 10 mg daily  Hctz 25 mg daily  Metoprolol 100 mg bid Olmesartan 40 mg daily   Bmet with GFR of 44.7 Encouraged good water intake  If this goes down further consider change  hydrochlorothiazide   Stopping metformin and replacing with low dose jardiance today         Respiratory   Post-viral cough syndrome   Pt mentions she had flu or flu like illness 3 wk ago  Still mild cough/not too bothersome  Reassuring exam today / clear lung sounds   Instructed to call if symptoms worsen         Endocrine   Diabetes mellitus treated with oral medication (HCC) - Primary   Lab Results  Component Value Date   HGBA1C 6.0 (A) 12/07/2023   HGBA1C 6.3 08/21/2023   HGBA1C 6.3 12/08/2022   Well controlled Encouraged to eat more regularly and eat more protein  Microalb recalculated from dec is high GFR in 40 s  Will change metformin to low dose jardiance, 10 mg daily  Discussed potential side effects incl dehydration (will keep hydrated), uti and vaginitis Encouraged to call if side effects Follow up 3 mo  Will do A1c and bmet at that visit         Relevant Medications   empagliflozin (JARDIANCE) 10 MG TABS tablet   Other Relevant Orders   POCT HgB A1C (Completed)     Other   Decreased GFR   Last GFR 44.75 Microalb re calc was high  Encouraged good hydration  Today d/c metformin  Will try jardiance 10 mg daily for DM (reviewed possible side effects)  Follow up 3 mo for visit and labs

## 2023-12-07 NOTE — Assessment & Plan Note (Signed)
 Lab Results  Component Value Date   HGBA1C 6.0 (A) 12/07/2023   HGBA1C 6.3 08/21/2023   HGBA1C 6.3 12/08/2022   Well controlled Encouraged to eat more regularly and eat more protein  Microalb recalculated from dec is high GFR in 40 s  Will change metformin to low dose jardiance, 10 mg daily  Discussed potential side effects incl dehydration (will keep hydrated), uti and vaginitis Encouraged to call if side effects Follow up 3 mo  Will do A1c and bmet at that visit

## 2024-01-07 ENCOUNTER — Other Ambulatory Visit: Payer: Self-pay | Admitting: Family Medicine

## 2024-01-16 ENCOUNTER — Other Ambulatory Visit: Payer: Self-pay | Admitting: Family Medicine

## 2024-02-22 DIAGNOSIS — E119 Type 2 diabetes mellitus without complications: Secondary | ICD-10-CM | POA: Diagnosis not present

## 2024-02-22 DIAGNOSIS — H401132 Primary open-angle glaucoma, bilateral, moderate stage: Secondary | ICD-10-CM | POA: Diagnosis not present

## 2024-03-18 ENCOUNTER — Ambulatory Visit: Admitting: Family Medicine

## 2024-03-18 DIAGNOSIS — I739 Peripheral vascular disease, unspecified: Secondary | ICD-10-CM | POA: Diagnosis not present

## 2024-03-20 ENCOUNTER — Ambulatory Visit: Payer: Self-pay | Admitting: Family Medicine

## 2024-03-20 ENCOUNTER — Encounter: Payer: Self-pay | Admitting: Family Medicine

## 2024-03-20 ENCOUNTER — Ambulatory Visit (INDEPENDENT_AMBULATORY_CARE_PROVIDER_SITE_OTHER): Payer: Medicare Other | Admitting: Family Medicine

## 2024-03-20 ENCOUNTER — Ambulatory Visit
Admission: RE | Admit: 2024-03-20 | Discharge: 2024-03-20 | Disposition: A | Discharge status: Home / Self Care | Source: Ambulatory Visit | Attending: Family Medicine | Admitting: Family Medicine

## 2024-03-20 VITALS — BP 130/65 | HR 61 | Temp 97.5°F | Ht 59.5 in | Wt 115.2 lb

## 2024-03-20 DIAGNOSIS — M4316 Spondylolisthesis, lumbar region: Secondary | ICD-10-CM | POA: Diagnosis not present

## 2024-03-20 DIAGNOSIS — N1832 Chronic kidney disease, stage 3b: Secondary | ICD-10-CM | POA: Diagnosis not present

## 2024-03-20 DIAGNOSIS — M79604 Pain in right leg: Secondary | ICD-10-CM

## 2024-03-20 DIAGNOSIS — E119 Type 2 diabetes mellitus without complications: Secondary | ICD-10-CM

## 2024-03-20 DIAGNOSIS — M7918 Myalgia, other site: Secondary | ICD-10-CM

## 2024-03-20 DIAGNOSIS — M5136 Other intervertebral disc degeneration, lumbar region with discogenic back pain only: Secondary | ICD-10-CM | POA: Diagnosis not present

## 2024-03-20 DIAGNOSIS — E785 Hyperlipidemia, unspecified: Secondary | ICD-10-CM | POA: Diagnosis not present

## 2024-03-20 DIAGNOSIS — Z7984 Long term (current) use of oral hypoglycemic drugs: Secondary | ICD-10-CM

## 2024-03-20 DIAGNOSIS — E1169 Type 2 diabetes mellitus with other specified complication: Secondary | ICD-10-CM | POA: Diagnosis not present

## 2024-03-20 DIAGNOSIS — M25551 Pain in right hip: Secondary | ICD-10-CM | POA: Diagnosis not present

## 2024-03-20 DIAGNOSIS — I10 Essential (primary) hypertension: Secondary | ICD-10-CM

## 2024-03-20 DIAGNOSIS — M47816 Spondylosis without myelopathy or radiculopathy, lumbar region: Secondary | ICD-10-CM | POA: Diagnosis not present

## 2024-03-20 DIAGNOSIS — M8580 Other specified disorders of bone density and structure, unspecified site: Secondary | ICD-10-CM | POA: Diagnosis not present

## 2024-03-20 LAB — BASIC METABOLIC PANEL WITH GFR
BUN: 28 mg/dL — ABNORMAL HIGH (ref 6–23)
CO2: 30 meq/L (ref 19–32)
Calcium: 9.8 mg/dL (ref 8.4–10.5)
Chloride: 99 meq/L (ref 96–112)
Creatinine, Ser: 1.43 mg/dL — ABNORMAL HIGH (ref 0.40–1.20)
GFR: 33.24 mL/min — ABNORMAL LOW (ref >60.00)
Glucose, Bld: 110 mg/dL — ABNORMAL HIGH (ref 70–99)
Potassium: 4.1 meq/L (ref 3.5–5.1)
Sodium: 136 meq/L (ref 135–145)

## 2024-03-20 LAB — POCT GLYCOSYLATED HEMOGLOBIN (HGB A1C): Hemoglobin A1C: 5.8 % — AB (ref 4.0–5.6)

## 2024-03-20 MED ORDER — CLOPIDOGREL BISULFATE 75 MG PO TABS: 75.0000 mg | ORAL_TABLET | Freq: Every day | ORAL | 1 refills | Status: DC

## 2024-03-20 MED ORDER — BLOOD GLUCOSE MONITORING SUPPL DEVI
1.0000 | Freq: Every day | 0 refills | Status: AC
Start: 2024-03-20 — End: ?

## 2024-03-20 MED ORDER — BLOOD GLUCOSE TEST VI STRP: 1.0000 | ORAL_STRIP | Freq: Every day | 3 refills | Status: AC

## 2024-03-20 MED ORDER — LANCET DEVICE MISC: 1.0000 | Freq: Every day | 0 refills | Status: AC

## 2024-03-20 MED ORDER — LANCETS MISC. MISC: 1.0000 | Freq: Every day | 3 refills | Status: AC

## 2024-03-20 NOTE — Assessment & Plan Note (Signed)
 Lab Results  Component Value Date   HGBA1C 5.8 (A) 03/20/2024   HGBA1C 6.0 (A) 12/07/2023   HGBA1C 6.3 08/21/2023   Doing well  Jardiance  10 mg daily  No longer on metformin   Microalb elevated last time Also on arb  Eating well   Sent in dm test equip

## 2024-03-20 NOTE — Assessment & Plan Note (Signed)
 bp in fair control at this time  BP Readings from Last 1 Encounters:  03/20/24 130/65   No changes needed Most recent labs reviewed  Disc lifstyle change with low sodium diet and exercise  Plan to continue Amlodipine  10 mg daily  Hctz 25 mg daily  Metoprolol  100 mg bid Olmesartan  40 mg daily   Bmet with GFR of 44.7 Encouraged good water intake  If this goes down further consider change hydrochlorothiazide    Bmet today

## 2024-03-20 NOTE — Assessment & Plan Note (Signed)
 Acute on chronic SI area Radiates to RLE  Reassuring exam    Xray LS and pelvis today

## 2024-03-20 NOTE — Assessment & Plan Note (Signed)
 With elevated microalb in setting of dm and HTN  Both in control  Now on jardiance  instead of metformin    Bmet today Good water intake

## 2024-03-20 NOTE — Patient Instructions (Addendum)
 Labs for kidney function today   Diabetes is in good control  Keep drinking fluids and eating well   Xray of low back and pelvis today  We will reach out with results and make a plan   I sent in diabetic test equipment and plavix 

## 2024-03-20 NOTE — Progress Notes (Signed)
 Subjective:    Patient ID: Kathy Cunningham, female    DOB: [birth year], 88 y.o.   MRN: [id number]  HPI  Wt Readings from Last 3 Encounters:  03/20/24 115 lb 4 oz (52.3 kg)  12/07/23 113 lb 8 oz (51.5 kg)  09/21/23 116 lb (52.6 kg)   22.89 kg/m  Vitals:   03/20/24 0929 03/20/24 0955  BP: (!) 142/64 130/65  Pulse: 61   Temp: (!) 97.5 F (36.4 C)   SpO2: 100%      Pt presents for follow up of chronic medical conditions including DM2 HTN Hyperlipidemia Also right buttock pain    HTN bp is stable today  No cp or palpitations or headaches or edema  No side effects to medicines  BP Readings from Last 3 Encounters:  03/20/24 130/65  12/07/23 128/62  09/21/23 (!) 136/54   Amlodipine  10 mg daily  Hctz 25 mg daily  Metoprolol  100 mg bid Olmesartan  40 mg daily   Pulse Readings from Last 3 Encounters:  03/20/24 61  12/07/23 71  09/21/23 69      CKD 3a Swapped metformin  for jardiance   Does take hydrochlorothiazide  Does take alendronate    Lab Results  Component Value Date   NA 140 08/21/2023   K 4.3 08/21/2023   CO2 28 08/21/2023   GLUCOSE 79 08/21/2023   BUN 21 08/21/2023   CREATININE 1.12 08/21/2023   CALCIUM  9.8 08/21/2023   GFR 44.75 (L) 08/21/2023   GFRNONAA 50 (L) 02/01/2021   Daughter tells her she does not drink enough water Has container with numbers on it  About 6   16 oz bottles per day     DM2 Diabetes Home sugar results   DM diet -appetite is good   Exercise -not a lot of exercise   Last visit changed metformin  (low gfr) to low dose jardiance  10 mg daily   5.8 today  Lab Results  Component Value Date   HGBA1C 5.8 (A) 03/20/2024   HGBA1C 6.0 (A) 12/07/2023   HGBA1C 6.3 08/21/2023   Lab Results  Component Value Date   MICROALBUR 4.8 (H) 10/04/2009    Renal protection Arb and jardiance   Last eye exam 08/2023  Hyperlipidemia Lab Results  Component Value Date   CHOL 151 08/21/2023   HDL 70.80 08/21/2023   LDLCALC 67  08/21/2023   TRIG 65.0 08/21/2023   CHOLHDL 2 08/21/2023   Rosuvastatin  20 mg daily   Just saw podiatrist for paronychia   History of re vasc procedure for PVD    Hip bothers her -right / worse --more buttock than hip  Tail bone bothers her - has to carry a pillow   Patient Active Problem List   Diagnosis Date Noted   Right buttock pain 03/20/2024   Post-viral cough syndrome 12/07/2023   Chronic kidney disease (CKD) stage G3b/A1, moderately decreased glomerular filtration rate (GFR) between 30-44 mL/min/1.73 square meter and albuminuria creatinine ratio less than 30 mg/g (HCC) 09/21/2023   PVD (peripheral vascular disease) (HCC) 12/08/2022   Ingrown nail of great toe 12/08/2022   Hearing loss 10/22/2019   Tinnitus aurium, right 09/26/2019   Routine general medical examination at a health care facility 06/15/2015   Estrogen deficiency 06/15/2015   Breast cancer screening 12/17/2013   Encounter for screening mammogram for breast cancer 04/24/2011   Glaucoma 04/24/2011   Vitamin D  deficiency 04/24/2011   GAD (generalized anxiety disorder) 10/20/2010   HYPOKALEMIA 10/22/2009   Other thalassemia (HCC)    Osteoporosis  05/01/2008   Diabetes mellitus treated with oral medication (HCC) 07/17/2007   Hyperlipidemia associated with type 2 diabetes mellitus (HCC) 07/17/2007   Anemia 07/17/2007   Essential hypertension 07/17/2007   PREMATURE VENTRICULAR CONTRACTIONS, FREQUENT 07/17/2007   UTI'S, RECURRENT 07/17/2007   Past Medical History:  Diagnosis Date   Anemia    Arthritis    hands   Diabetes mellitus    type II   Dizziness    or vertigo   Glaucoma    Hyperlipidemia    Hypertension    Posterior auricular lymphadenopathy    Right - comes and goes   Past Surgical History:  Procedure Laterality Date   bladder papilloma  2006   CATARACT EXTRACTION W/PHACO Left 01/17/2022   Procedure: CATARACT EXTRACTION PHACO AND INTRAOCULAR LENS PLACEMENT (IOC) LEFT DIABETIC RAYNOR  LENS;  Surgeon: Jaye Fallow, MD;  Location: [facility];  Service: Ophthalmology;  Laterality: Left;  Diabetic 19.50 02:06.5   CATARACT EXTRACTION W/PHACO Right 01/31/2022   Procedure: CATARACT EXTRACTION PHACO AND INTRAOCULAR LENS PLACEMENT (IOC) RIGHT DIABETIC RAYNOR LENS;  Surgeon: Jaye Fallow, MD;  Location: [facility];  Service: Ophthalmology;  Laterality: Right;  10.55 0:56.4   ROTATOR CUFF REPAIR  01/2002   vaginal hysterectomy     Social History   Tobacco Use   Smoking status: Never   Smokeless tobacco: Never  Vaping Use   Vaping status: Never Used  Substance Use Topics   Alcohol use: No    Alcohol/week: 0.0 standard drinks of alcohol   Drug use: No   Family History  Problem Relation Age of Onset   Heart disease Mother        MI   Heart attack Mother    Hypertension Mother    Heart disease Father        MI   Heart attack Father    Hypertension Father    Cancer Brother        pancreatic    Allergies  Allergen Reactions   Atorvastatin Other (See Comments)    REACTION: cramps   Risedronate Sodium Nausea And Vomiting and Other (See Comments)   Sulfonamide Derivatives Rash   Current Outpatient Medications on File Prior to Visit  Medication Sig Dispense Refill   Accu-Chek Softclix Lancets lancets Use as instructed 100 each 3   alendronate  (FOSAMAX ) 70 MG tablet Take 1 tablet (70 mg total) by mouth every 7 (seven) days. Take with a full glass of water on an empty stomach. 12 tablet 3   ALPRAZolam  (XANAX ) 0.5 MG tablet 1/2 to 1 pill by mouth twice daily as needed for anxiety with travel, caution of sedation 10 tablet 0   amLODipine  (NORVASC ) 10 MG tablet Take 1 tablet (10 mg total) by mouth daily. 100 tablet 3   aspirin EC 81 MG tablet Take 81 mg by mouth daily. Swallow whole.     brimonidine  (ALPHAGAN ) 0.2 % ophthalmic solution Place 1 drop into both eyes daily.     Cal Carb-Mag Hydrox-Simeth (ROLAIDS ADVANCED PO) Take 1 tablet by mouth as  needed (indigestion).     calcium  carbonate (TUMS - DOSED IN MG ELEMENTAL CALCIUM ) 500 MG chewable tablet Chew 1 tablet by mouth daily as needed for indigestion or heartburn.     cholecalciferol (VITAMIN D ) 1000 UNITS tablet Take 2,000 Units by mouth daily.      cyclobenzaprine  (FLEXERIL ) 10 MG tablet Take 1 tablet (10 mg total) by mouth 2 (two) times daily as needed for muscle spasms. 60  tablet 0   empagliflozin  (JARDIANCE ) 10 MG TABS tablet Take 1 tablet (10 mg total) by mouth daily before breakfast. 90 tablet 1   escitalopram  (LEXAPRO ) 5 MG tablet TAKE 1 TABLET (5 MG TOTAL) BY MOUTH DAILY. 90 tablet 1   glucose blood (ACCU-CHEK GUIDE) test strip Use as instructed 100 each 3   hydrochlorothiazide  (HYDRODIURIL ) 25 MG tablet TAKE 1 TABLET BY MOUTH DAILY 100 tablet 1   latanoprost (XALATAN) 0.005 % ophthalmic solution Place 1 drop into both eyes at bedtime.     Lotilaner (XDEMVY) 0.25 % SOLN Apply 1 drop to eye in the morning and at bedtime.     metoprolol  tartrate (LOPRESSOR ) 100 MG tablet Take 1 tablet (100 mg total) by mouth 2 (two) times daily. 200 tablet 3   olmesartan  (BENICAR ) 40 MG tablet Take 1 tablet (40 mg total) by mouth daily. 100 tablet 3   potassium chloride  SA (KLOR-CON  M) 20 MEQ tablet TAKE 1 TABLET BY MOUTH DAILY 100 tablet 2   rosuvastatin  (CRESTOR ) 20 MG tablet TAKE 1 TABLET BY MOUTH DAILY AT  NOON 100 tablet 2   No current facility-administered medications on file prior to visit.    Review of Systems  Constitutional:  Negative for activity change, appetite change, fatigue, fever and unexpected weight change.  HENT:  Negative for congestion, ear pain, rhinorrhea, sinus pressure and sore throat.   Eyes:  Negative for pain, redness and visual disturbance.  Respiratory:  Negative for cough, shortness of breath and wheezing.   Cardiovascular:  Negative for chest pain and palpitations.  Gastrointestinal:  Negative for abdominal pain, blood in stool, constipation and diarrhea.   Endocrine: Negative for polydipsia and polyuria.  Genitourinary:  Negative for dysuria, frequency and urgency.  Musculoskeletal:  Positive for back pain and gait problem. Negative for arthralgias, joint swelling and myalgias.  Skin:  Negative for pallor and rash.  Allergic/Immunologic: Negative for environmental allergies.  Neurological:  Negative for dizziness, syncope and headaches.  Hematological:  Negative for adenopathy. Does not bruise/bleed easily.  Psychiatric/Behavioral:  Negative for decreased concentration and dysphoric mood. The patient is not nervous/anxious.        Objective:   Physical Exam Constitutional:      General: She is not in acute distress.    Appearance: Normal appearance. She is well-developed and normal weight. She is not ill-appearing or diaphoretic.  HENT:     Head: Normocephalic and atraumatic.  Eyes:     Conjunctiva/sclera: Conjunctivae normal.     Pupils: Pupils are equal, round, and reactive to light.  Neck:     Thyroid : No thyromegaly.     Vascular: No carotid bruit or JVD.  Cardiovascular:     Rate and Rhythm: Normal rate and regular rhythm.     Heart sounds: Normal heart sounds.     No gallop.  Pulmonary:     Effort: Pulmonary effort is normal. No respiratory distress.     Breath sounds: Normal breath sounds. No wheezing or rales.  Abdominal:     General: There is no distension or abdominal bruit.     Palpations: Abdomen is soft.  Musculoskeletal:     Cervical back: Normal range of motion and neck supple.     Right lower leg: No edema.     Left lower leg: No edema.     Comments: No LS tenderness No coccyx tenderness Mild right SI area tenderness Normal rom of ls and hips   Fair flexibility  No trochanteric tenderness  Lymphadenopathy:     Cervical: No cervical adenopathy.  Skin:    General: Skin is warm and dry.     Coloration: Skin is not pale.     Findings: No rash.  Neurological:     Mental Status: She is alert.     Motor:  No weakness.     Coordination: Coordination normal.     Gait: Gait normal.     Deep Tendon Reflexes: Reflexes are normal and symmetric. Reflexes normal.     Comments: Neg SLR  Psychiatric:        Mood and Affect: Mood normal.           Assessment & Plan:   Problem List Items Addressed This Visit       Cardiovascular and Mediastinum   Essential hypertension   bp in fair control at this time  BP Readings from Last 1 Encounters:  03/20/24 130/65   No changes needed Most recent labs reviewed  Disc lifstyle change with low sodium diet and exercise  Plan to continue Amlodipine  10 mg daily  Hctz 25 mg daily  Metoprolol  100 mg bid Olmesartan  40 mg daily   Bmet with GFR of 44.7 Encouraged good water intake  If this goes down further consider change hydrochlorothiazide    Bmet today      Relevant Orders   Basic metabolic panel with GFR     Endocrine   Hyperlipidemia associated with type 2 diabetes mellitus (HCC)   Disc goals for lipids and reasons to control them Rev last labs with pt Rev low sat fat diet in detail  LDL of 67 with HDL over 70 Good control Continues rosuvastatin  20 mg daily      Diabetes mellitus treated with oral medication (HCC) - Primary   Lab Results  Component Value Date   HGBA1C 5.8 (A) 03/20/2024   HGBA1C 6.0 (A) 12/07/2023   HGBA1C 6.3 08/21/2023   Doing well  Jardiance  10 mg daily  No longer on metformin   Microalb elevated last time Also on arb  Eating well   Sent in dm test equip        Genitourinary   Chronic kidney disease (CKD) stage G3b/A1, moderately decreased glomerular filtration rate (GFR) between 30-44 mL/min/1.73 square meter and albuminuria creatinine ratio less than 30 mg/g (HCC)   With elevated microalb in setting of dm and HTN  Both in control  Now on jardiance  instead of metformin    Bmet today Good water intake       Relevant Orders   Basic metabolic panel with GFR     Other   Right buttock pain    Acute on chronic SI area Radiates to RLE  Reassuring exam    Xray LS and pelvis today        Relevant Orders   DG Lumbar Spine 2-3 Views (Completed)   DG Pelvis 1-2 Views (Completed)   Other Visit Diagnoses       Controlled type 2 diabetes mellitus without complication, without long-term current use of insulin (HCC)       Relevant Orders   POCT HgB A1C (Completed)

## 2024-03-20 NOTE — Assessment & Plan Note (Signed)
 Disc goals for lipids and reasons to control them Rev last labs with pt Rev low sat fat diet in detail  LDL of 67 with HDL over 70 Good control Continues rosuvastatin 20 mg daily

## 2024-03-28 ENCOUNTER — Other Ambulatory Visit: Payer: Self-pay | Admitting: Family Medicine

## 2024-04-02 ENCOUNTER — Telehealth: Payer: Self-pay

## 2024-04-02 ENCOUNTER — Telehealth: Payer: Self-pay | Admitting: Family Medicine

## 2024-04-02 NOTE — Telephone Encounter (Unsigned)
 Copied from CRM [id number]. Topic: Clinical - Medication Refill >> Apr 02, 2024  3:23 PM [place] A wrote: Medication: clopidogrel  (PLAVIX ) 75 MG tablet  Has the patient contacted their pharmacy? No (Agent: If no, request that the patient contact the pharmacy for the refill. If patient does not wish to contact the pharmacy document the reason why and proceed with request.) (Agent: If yes, when and what did the pharmacy advise?)  This is the patient's preferred pharmacy:  [id number] [place], [place] - [address] [place] [address] Phone: [phone] Fax: [phone]   Is this the correct pharmacy for this prescription? Yes If no, delete pharmacy and type the correct one.   Has the prescription been filled recently? No  Is the patient out of the medication? Yes  Has the patient been seen for an appointment in the last year OR does the patient have an upcoming appointment? Yes  Can we respond through MyChart? No  Agent: Please be advised that Rx refills may take up to 3 business days. We ask that you follow-up with your pharmacy.

## 2024-04-02 NOTE — Telephone Encounter (Signed)
 Copied from CRM [id number]. Topic: Clinical - Lab/Test Results >> Apr 02, 2024  3:25 PM Turkey A wrote: Reason for CRM: Patient would like X-ray results explained to her-please contact

## 2024-04-03 NOTE — Telephone Encounter (Signed)
 Called pt and no answer and pt's VM box is still full, no message left

## 2024-04-03 NOTE — Progress Notes (Signed)
 Aware. thanks

## 2024-04-03 NOTE — Telephone Encounter (Signed)
 Pt called back, addressed through result notes

## 2024-04-09 DIAGNOSIS — I739 Peripheral vascular disease, unspecified: Secondary | ICD-10-CM | POA: Diagnosis not present

## 2024-04-11 ENCOUNTER — Telehealth: Payer: Self-pay | Admitting: Family Medicine

## 2024-04-11 ENCOUNTER — Other Ambulatory Visit (INDEPENDENT_AMBULATORY_CARE_PROVIDER_SITE_OTHER)

## 2024-04-11 DIAGNOSIS — E1169 Type 2 diabetes mellitus with other specified complication: Secondary | ICD-10-CM

## 2024-04-11 DIAGNOSIS — N1832 Chronic kidney disease, stage 3b: Secondary | ICD-10-CM | POA: Diagnosis not present

## 2024-04-11 DIAGNOSIS — I10 Essential (primary) hypertension: Secondary | ICD-10-CM

## 2024-04-11 DIAGNOSIS — E785 Hyperlipidemia, unspecified: Secondary | ICD-10-CM

## 2024-04-11 LAB — MICROALBUMIN / CREATININE URINE RATIO
Creatinine,U: 35.4 mg/dL
Microalb Creat Ratio: 119.8 mg/g — ABNORMAL HIGH (ref 0.0–30.0)
Microalb, Ur: 4.2 mg/dL — ABNORMAL HIGH (ref 0.0–1.9)

## 2024-04-11 LAB — BASIC METABOLIC PANEL WITH GFR
BUN: 19 mg/dL (ref 6–23)
CO2: 29 meq/L (ref 19–32)
Calcium: 9.5 mg/dL (ref 8.4–10.5)
Chloride: 102 meq/L (ref 96–112)
Creatinine, Ser: 1.29 mg/dL — ABNORMAL HIGH (ref 0.40–1.20)
GFR: 37.6 mL/min — ABNORMAL LOW (ref >60.00)
Glucose, Bld: 118 mg/dL — ABNORMAL HIGH (ref 70–99)
Potassium: 4.1 meq/L (ref 3.5–5.1)
Sodium: 140 meq/L (ref 135–145)

## 2024-04-11 MED ORDER — CLOPIDOGREL BISULFATE 75 MG PO TABS: 75.0000 mg | ORAL_TABLET | Freq: Every day | ORAL | 2 refills | Status: AC

## 2024-04-11 NOTE — Telephone Encounter (Signed)
 Here for labs Needed plavix  prescription printed

## 2024-04-13 ENCOUNTER — Ambulatory Visit: Payer: Self-pay | Admitting: Family Medicine

## 2024-04-13 DIAGNOSIS — M810 Age-related osteoporosis without current pathological fracture: Secondary | ICD-10-CM

## 2024-04-13 DIAGNOSIS — I10 Essential (primary) hypertension: Secondary | ICD-10-CM

## 2024-04-13 DIAGNOSIS — E1169 Type 2 diabetes mellitus with other specified complication: Secondary | ICD-10-CM

## 2024-04-13 DIAGNOSIS — N1832 Chronic kidney disease, stage 3b: Secondary | ICD-10-CM

## 2024-04-13 DIAGNOSIS — E119 Type 2 diabetes mellitus without complications: Secondary | ICD-10-CM

## 2024-04-14 DIAGNOSIS — I70203 Unspecified atherosclerosis of native arteries of extremities, bilateral legs: Secondary | ICD-10-CM | POA: Diagnosis not present

## 2024-04-14 DIAGNOSIS — L600 Ingrowing nail: Secondary | ICD-10-CM | POA: Diagnosis not present

## 2024-04-18 ENCOUNTER — Telehealth: Payer: Self-pay

## 2024-04-18 NOTE — Progress Notes (Signed)
 Care Guide Pharmacy Note  04/18/2024 Name: TUNISHA RULAND MRN: [id number] DOB: 21-Aug-1936  Referred By: Randeen Laine LABOR, MD Reason for referral: Complex Care Management and Call Attempt #1 (Successful initial outreach scheduled with PHARM D- Manuelita)   CHERYLEE RAWLINSON is a 88 y.o. year old female who is a primary care patient of Tower, Laine LABOR, MD.  Elveria JULIANNA Mac was referred to the pharmacist for assistance related to: DMII  Successful contact was made with the patient to discuss pharmacy services including being ready for the pharmacist to call at least 5 minutes before the scheduled appointment time and to have medication bottles and any blood pressure readings ready for review. The patient agreed to meet with the pharmacist via telephone visit on (date/time). 04/23/24 @ 1 PM.  Leotis Rase [facility], [facility] Guide  Direct Dial: [phone]  Fax [phone]

## 2024-04-23 ENCOUNTER — Other Ambulatory Visit (INDEPENDENT_AMBULATORY_CARE_PROVIDER_SITE_OTHER): Admitting: Pharmacist

## 2024-04-23 ENCOUNTER — Telehealth: Payer: Self-pay

## 2024-04-23 DIAGNOSIS — I739 Peripheral vascular disease, unspecified: Secondary | ICD-10-CM

## 2024-04-23 DIAGNOSIS — I10 Essential (primary) hypertension: Secondary | ICD-10-CM

## 2024-04-23 DIAGNOSIS — M810 Age-related osteoporosis without current pathological fracture: Secondary | ICD-10-CM

## 2024-04-23 DIAGNOSIS — E1169 Type 2 diabetes mellitus with other specified complication: Secondary | ICD-10-CM

## 2024-04-23 DIAGNOSIS — E119 Type 2 diabetes mellitus without complications: Secondary | ICD-10-CM

## 2024-04-23 MED ORDER — DENOSUMAB 60 MG/ML ~~LOC~~ SOSY: 60.0000 mg | PREFILLED_SYRINGE | Freq: Once | SUBCUTANEOUS | Status: AC

## 2024-04-23 NOTE — Progress Notes (Signed)
 04/23/2024 Name: Kathy Cunningham MRN: [id number] DOB: 1936-06-13  Subjective  Chief Complaint  Patient presents with   Hypertension   Chronic Kidney Disease   Diabetes   Osteoporosis    Reason for visit: ?  Kathy Cunningham is a 89 y.o. female who presents today for an initial pharmacotherapy visit for chronic disease state management related to diabetes, hypertension, CKD and osteoporosis.? Pertinent PMH also includes frequent PVC, PVD s/p revascularization 12/2022, thalassemia, anemia, glaucoma.   Care Team: Primary Care Provider: Tower, Laine LABOR, MD   Medication Access/Adherence: Prescription drug coverage: Payor: [employer] MEDIC[facility] / Plan: [facility] MEDICARE / Product Type: *No Product type* / .  - Reports that all medications are affordable.  - Current Patient Assistance: None - Medication Adherence: Patient denies missing doses of their medication.   DIABETES Known DM Complications: PAD, nephropathy  Reported DM Regimen: ?  Jardiance  10 mg daily   SMBG: has glucometer and supplies to use as needed. Not currently checking BG.  ?    Exercise: minimal formal exercise  DM Prevention:  Statin: Taking; high intensity.?  History of chronic kidney disease? yes History of albuminuria? yes, last UACR on 04/11/24 = 119.8 mg/g ACE/ARB - Taking olmesartan  40mg ; Urine MA/CR Ratio - elevated urinary albumin excretion.  Last eye exam: 08/24/23; No retinopathy present Last foot exam: 09/21/2023 Tobacco Use: Never smoker Immunizations:? Flu: Up to date (Last: 09/21/2023); Pneumococcal: PPSV23 (2014) PCV13 (2016) bother after age 32; Shingrix: (Last: 08/06/2023, 2nd dose unclear)     HYPERTENSION Reported HTN Regimen: Olmesartan  40 mg daily Metoprolol  tartrate 100 mg twice daily Amlodipine  10 mg daily   Has a wrist cuff at home. Does not check regularly. Cuff has not been validated in clinic for accuracy.  Denies new concerns for dizziness/lightheadedness, CP, SOB.   Does  endorse ongoing dizziness only immediately following use of her eye drops (brimonidine ). Only uses the drops at night because of this. Reports she was on difference combination of eye drops last year and tolerated them very well though they were changed at the end of the year due to entering the [facility]. She thinks the name was Lumigan, Alphagan .   Cardiovascular Risk Reduction History of clinical ASCVD? no PVD s/p revascularization procedure.  History of heart failure? no History of hyperlipidemia? yes Current BMI: 22.8 kg/m2 (Ht 4'11.5, Wt 52.3 kg) Taking statin? yes; high intensity (rosuvastatin  20 mg) Taking aspirin? unclear if indicated; Has been taking ASA + clopidogrel  though is confused if she should be taking both    Taking SGLT-2i? yes Taking GLP- 1 RA? no   CKD Reported CKD Regimen: Olmesartan  40 mg daily Jardiance  10 mg daily  Potassium 20 mEq daily   _______________________________________________  Objective    Review of Systems:? Limited in the setting of virtual visit  Cardiovascular:? No chest pain or pressure, shortness of breath, dyspnea on exertion, orthopnea or LE edema  Pulmonary:? No cough or shortness of breath  GI:? No nausea, vomiting, constipation, diarrhea, abdominal pain, dyspepsia, change in bowel habits    Physical Examination:  Vitals:  Wt Readings from Last 3 Encounters:  03/20/24 115 lb 4 oz (52.3 kg)  12/07/23 113 lb 8 oz (51.5 kg)  09/21/23 116 lb (52.6 kg)   BP Readings from Last 3 Encounters:  03/20/24 130/65  12/07/23 128/62  09/21/23 (!) 136/54   Pulse Readings from Last 3 Encounters:  03/20/24 61  12/07/23 71  09/21/23 69     Labs:?  Lab  Results  Component Value Date   HGBA1C 5.8 (A) 03/20/2024   HGBA1C 6.0 (A) 12/07/2023   HGBA1C 6.3 08/21/2023   GLUCOSE 118 (H) 04/11/2024   MICRALBCREAT 119.8 (H) 04/11/2024   MICRALBCREAT 30.8 (H) 10/04/2009   CREATININE 1.29 (H) 04/11/2024   CREATININE 1.43 (H) 03/20/2024    CREATININE 1.12 08/21/2023   GFR 37.60 (L) 04/11/2024   GFR 33.24 (L) 03/20/2024   GFR 44.75 (L) 08/21/2023    Lab Results  Component Value Date   CHOL 151 08/21/2023   LDLCALC 67 08/21/2023   LDLCALC 78 12/08/2022   LDLCALC 76 08/07/2022   HDL 70.80 08/21/2023   TRIG 65.0 08/21/2023   TRIG 78.0 12/08/2022   TRIG 63.0 08/07/2022   ALT 8 08/21/2023   ALT 9 12/08/2022   AST 20 08/21/2023   AST 20 12/08/2022      Chemistry      Component Value Date/Time   NA 140 04/11/2024 0858   NA 144 06/24/2013 1021   K 4.1 04/11/2024 0858   CL 102 04/11/2024 0858   CO2 29 04/11/2024 0858   BUN 19 04/11/2024 0858   BUN 14 06/24/2013 1021   CREATININE 1.29 (H) 04/11/2024 0858      Component Value Date/Time   CALCIUM  9.5 04/11/2024 0858   ALKPHOS 51 08/21/2023 1107   AST 20 08/21/2023 1107   ALT 8 08/21/2023 1107   BILITOT 0.7 08/21/2023 1107     Radiology: DEXA 06/22/2021 Lumbar spine L1-L4(L3)  T-score -1.2  RFN: -2.7 LFN: -3.0 DEXA 04/22/2018 Lumbar spine L1-L4   T-score -1.3  RFN: -2.3 LFN: -2.5 DEXA 06/22/2015 Lumbar spine L1-L4   T-score - 0.3  RFN: - 2.1 LFN: - 2.6    TSH (uIU/mL)  Date Value  08/21/2023 0.71  08/07/2022 1.46  03/29/2021 1.09  04/15/2019 1.38  04/09/2018 1.11    Assessment and Plan:     1. CKD: G3bA2. GFR 04/11/24 38 improved slightly from previous 33 (03/20/24) though below previous baseline. Steady GFR decline noted over the past several years. Jardiance  added in April in the setting of microalbuminuria though unfortunately on repeat, urine albumin continues to increase.  Manageable Risk factors are generally well-controlled (HTN, DM).   Will continue to work with patient regarding lifestyle modifications related to kidney health. Future consideration: Kerendia for UACR that remains elevated despite max tolerated RAASi and SGLT2i. Kerendia appropriate in eGFR >25. Would likely allow for discontinuation of potassium supplement. Prior auth  required. Patient assistance may be available.    2. HTN: controlled based on last clinic BP of 130/65 mmHg (03/20/24), though has since discontinued hydrochlorothiazide . BP goal <130/80 mmHg. Current regimen appropriate in the setting of CKD. RAAS remains strongly indicated for CKD. No renal dosing considerations for amlodipine /metoprolol .  Does not monitor BP at home though has a wrist cuff as needed (accuracy not confirmed). Has not been checking BP since stopping hydrochlorothiazide .  Denies new concerns for lightheadedness, dizziness, SOB, CP, vision changes. Does note dizziness only after using brimonidine  eye drops so she takes these at night.  Current Regimen: amlodipine  10 mg/d, metoprolol  tartrate 100 mg BID, olmesartan  40 mg/d Continue medications without changes. BP re-check in office w PCP as discussed May provide pt w BP cuff if willing to use at home (will work on getting more BP cuffs next week) Future consideration: ARB: On Max dose CCB: On Max dose Thiazide: Discontinued last visit. Continual decline in renal function.  BB: taking for PVC MRA: Avoid in  the setting of CKD w/o other compelling indication Hydralazine: Reasonable. Preferred in CKD. TID dosing may be inconvenient. Reasonable to start w 25mg  BID for compliance. No CHF diagnosis so no need for concurrent Imdur. Already on beta blocker. If any concern w fluid retention, may be paired w diuretic though not always needed.    3. Osteoporosis.  Oral bisphos since 07/2021. Consistent fill hx suggests good compliance. Renal function continues to steadily decline. Alendronate  not recommended in CrCl <35. Current CrCl (cockroft-gault) = 26 mL/min.  No hx fracture in the course of her disease (dx ~2009) though she remains at High Risk given severity of T-scores. T-scores stable. Reasonable at this time to consider Prolia  for her level of renal dysfunction. Anabolic agent not unreasonable in the setting of T-score -3 though given  her age and associated side effects w anabolics, risk may not outweigh benefit.  Phos, Mg, Ca(corrected), TSH all WNL. Hx VitD deficiency on supplementation. Last 39 ng/mL (08/2023).  Continue daily OTC VitD supplement Prolia  Pool messaged for evaluation of cost. Patient agreeable if affordable.  Per CMM: No PA needed for Prolia .  PCP has ordered routine Repeat DEXA - Dec '25  Consider repeat Vitamin D  at follow up given steadily down trending (taking VitD but not Calcium )   4. ASCVD (PVD s/p revascularization procedure 12/2022): Lipids well-controlled on last lipid panel with LDL 67 mg/dL, TG 65 mg/dL (87/82/75). Plavix  was added by [facility], Dr. Johnetta Artist s/p PAD revascularization procedure 12/2022. >1 year since revascularization surgery. Still taking DAPT which is often no longer indicated unless vascular deemed her as high risk for other reasons. Patient unsure of intended plan. Was told she would be on clopidogrel  for life though is unsure about aspirin.  Will contact vascular regarding intended long-term antiplatelet regimen.  [facility] - record request faxed (Fx: [phone])   5. Diabetes, type 2: controlled per last A1c of 5.8% (03/20/24). Goal <7% without hypoglycemia. Regimen optimized for prevention of hypoglycemia and for renal protection in the setting of CKD on SGLT2i.  Current Regimen: Jardiance  10 mg/d Continue medications today without changes.    6. Medication Access Reports Jardiance  / Eye drops/ Brand meds are affordable at this time. Did not address how much she is paying. May be eligible for PAP as needed. Will discuss at next visit.  Reports her eye drop regimen was changed last year due to cost/Medicare donut hole. Does not tolerate current regimen well. With elimination of Donut Hole, reasonable to look into her insurance benefits for this year and discuss w her eye doctor.  Previous regimen = Lumigan (bimatoprost) + Alphagan   (brimonidine ) Lumigan = $39 per month per [facility] test claim. May discuss appropriateness with eye doctor.    7. Healthcare Maintenance:  Pneumococcal - Current status: Up to date. PCV13, PPSV20 both after age 48. Risk/benefit of PCV20 reasonable.   Shingles - Current status: Incomplete vaccine record? x1 dose documented.  Influenza - Due Fall 2025.     Follow Up Patient given direct line for questions regarding medication therapy Follow up   Future Appointments  Date Time Provider Department Center  08/11/2024  8:10 AM [facility] ANNUAL WELLNESS VISIT 1 [facility]    Manuelita FABIENE Kobs, PharmD Clinical Pharmacist [facility] [phone]'

## 2024-04-23 NOTE — Telephone Encounter (Signed)
 Prolia  VOB initiated via [email or web address]  Next Prolia  inj DUE: NEW START

## 2024-04-23 NOTE — Progress Notes (Signed)
 Referral has been placed to start benefit verification process.

## 2024-04-24 ENCOUNTER — Telehealth: Payer: Self-pay | Admitting: *Deleted

## 2024-04-24 ENCOUNTER — Other Ambulatory Visit (HOSPITAL_COMMUNITY): Payer: Self-pay

## 2024-04-24 NOTE — Telephone Encounter (Signed)
 Kathy Cunningham

## 2024-04-24 NOTE — Telephone Encounter (Signed)
 Will route to Marvin who asked me to request the records

## 2024-04-24 NOTE — Telephone Encounter (Signed)
 Patient does not meet the requirements for medical PA (patient must fail BOTH an oral and IV bisphosphate). Patient can get Prolia  through pharmacy benefit. Completed referral for pharmacy benefit.    Pt ready for scheduling for PROLIA  on or after : 04/24/24  Option# 2- Med Obtained from pharmacy:  Pharmacy benefit: Copay $39.03 (Paid to pharmacy) Admin Fee: $20 (Pay at clinic)  Prior Auth: N/A PA# Expiration Date:   # of doses approved:   If patient wants fill through the pharmacy benefit please send prescription to: [facility], and include estimated need by date in rx notes. Pharmacy will ship medication directly to the office.  Patient NOT eligible for Prolia  Copay Card. Copay Card can make patient's cost as little as $25. Link to apply: [email or web address]://[email or web address]  ** This summary of benefits is an estimation of the patient's out-of-pocket cost. Exact cost may very based on individual plan coverage.

## 2024-04-24 NOTE — Telephone Encounter (Signed)
 Copied from CRM #[id number]. Topic: Medical Record Request - Other >> Apr 24, 2024  8:30 AM Franky GRADE wrote: Reason for CRM: Olam from [facility] is calling because they received a medical records request for the patient; however, they have never seen the patient. They tried to reach out and schedule but were never able to get a hold of her.

## 2024-04-30 ENCOUNTER — Telehealth: Payer: Self-pay | Admitting: Family Medicine

## 2024-04-30 NOTE — Telephone Encounter (Signed)
 Need to re check bmet when able Please ask her to hydrate very well   Hold off on shot until results  Thanks

## 2024-04-30 NOTE — Telephone Encounter (Signed)
 Called to get pt scheduled for her first Prolia  injection. She has lab work on 04/11/24 but her creatinine clearance is 25.78. Prolia  injection has been scheduled for 05/13/24 to iron out issues with lab work.  Do we need to repeat labs?

## 2024-05-01 NOTE — Telephone Encounter (Signed)
 Spoke with pt and she is aware of Dr. Graham response. Lab only appt has been scheduled for 05/06/24.

## 2024-05-06 ENCOUNTER — Telehealth: Payer: Self-pay | Admitting: Family Medicine

## 2024-05-06 ENCOUNTER — Other Ambulatory Visit

## 2024-05-06 DIAGNOSIS — E119 Type 2 diabetes mellitus without complications: Secondary | ICD-10-CM

## 2024-05-06 DIAGNOSIS — N1832 Chronic kidney disease, stage 3b: Secondary | ICD-10-CM

## 2024-05-06 NOTE — Telephone Encounter (Signed)
-----   Message from Veva JINNY Ferrari sent at 05/06/2024  3:34 PM EDT ----- Regarding: Lab orders for Wed, 9.3.25 Lab orders for urine? Appt says, repeat urine. Also, did you want us  to draw the pending labs from December?, thanks

## 2024-05-07 ENCOUNTER — Ambulatory Visit: Payer: Self-pay | Admitting: Family Medicine

## 2024-05-07 ENCOUNTER — Other Ambulatory Visit (INDEPENDENT_AMBULATORY_CARE_PROVIDER_SITE_OTHER)

## 2024-05-07 DIAGNOSIS — E119 Type 2 diabetes mellitus without complications: Secondary | ICD-10-CM

## 2024-05-07 DIAGNOSIS — Z7984 Long term (current) use of oral hypoglycemic drugs: Secondary | ICD-10-CM | POA: Diagnosis not present

## 2024-05-07 DIAGNOSIS — N1832 Chronic kidney disease, stage 3b: Secondary | ICD-10-CM | POA: Diagnosis not present

## 2024-05-07 DIAGNOSIS — I10 Essential (primary) hypertension: Secondary | ICD-10-CM

## 2024-05-07 LAB — BASIC METABOLIC PANEL WITH GFR
BUN: 26 mg/dL — ABNORMAL HIGH (ref 6–23)
CO2: 28 meq/L (ref 19–32)
Calcium: 9.1 mg/dL (ref 8.4–10.5)
Chloride: 102 meq/L (ref 96–112)
Creatinine, Ser: 1.31 mg/dL — ABNORMAL HIGH (ref 0.40–1.20)
GFR: 36.9 mL/min — ABNORMAL LOW (ref >60.00)
Glucose, Bld: 115 mg/dL — ABNORMAL HIGH (ref 70–99)
Potassium: 3.8 meq/L (ref 3.5–5.1)
Sodium: 138 meq/L (ref 135–145)

## 2024-05-07 LAB — HEMOGLOBIN A1C: Hgb A1c MFr Bld: 6.4 % (ref 4.6–6.5)

## 2024-05-13 ENCOUNTER — Ambulatory Visit

## 2024-05-28 ENCOUNTER — Telehealth: Payer: Self-pay

## 2024-05-28 NOTE — Telephone Encounter (Signed)
 Copied from CRM #[id number]. Topic: Referral - Question >> May 27, 2024  3:06 PM Aisha D wrote: Reason for CRM: Pt is calling to check that status of her referral for Nephrology due to not receiving a call from the office. I informed the pt that the referral is currently pending and has not been authorized as of yet. Pt would like to know when the referral would be authorized due to it being over 2 weeks and would like a callback with an update.

## 2024-05-28 NOTE — Telephone Encounter (Signed)
 Referral has been sent to [facility] in [place] , they will review and contact the patient to schedule.  Pt needs to contact them directly if she does not hear from them within 2-4 weeks.   [facility] [address] [place], [place] [address] Phone: [phone]

## 2024-05-28 NOTE — Telephone Encounter (Signed)
 Could someone check in with referrals?  Thanks !

## 2024-05-31 ENCOUNTER — Other Ambulatory Visit: Payer: Self-pay | Admitting: Family Medicine

## 2024-06-10 NOTE — Telephone Encounter (Signed)
 Patient called to follow up on her referral for a kidney specialist. Says that no one has followed up with her. Gave the patient the contact to call to schedule.

## 2024-06-11 DIAGNOSIS — N1832 Chronic kidney disease, stage 3b: Secondary | ICD-10-CM | POA: Diagnosis not present

## 2024-07-02 ENCOUNTER — Encounter (HOSPITAL_COMMUNITY): Payer: Self-pay

## 2024-07-02 ENCOUNTER — Ambulatory Visit (HOSPITAL_COMMUNITY)
Admission: EM | Admit: 2024-07-02 | Discharge: 2024-07-02 | Disposition: A | Discharge status: Home / Self Care | Attending: Nurse Practitioner | Admitting: Nurse Practitioner

## 2024-07-02 ENCOUNTER — Ambulatory Visit: Payer: Self-pay

## 2024-07-02 DIAGNOSIS — M25551 Pain in right hip: Secondary | ICD-10-CM | POA: Diagnosis not present

## 2024-07-02 DIAGNOSIS — R102 Pelvic and perineal pain unspecified side: Secondary | ICD-10-CM

## 2024-07-02 DIAGNOSIS — M79604 Pain in right leg: Secondary | ICD-10-CM | POA: Diagnosis not present

## 2024-07-02 MED ORDER — DEXAMETHASONE SOD PHOSPHATE PF 10 MG/ML IJ SOLN
10.0000 mg | Freq: Once | INTRAMUSCULAR | Status: AC
  Administered 2024-07-02: 10 mg via INTRAMUSCULAR

## 2024-07-02 MED ORDER — PREDNISONE 10 MG PO TABS: ORAL_TABLET | ORAL | 0 refills | Status: AC

## 2024-07-02 NOTE — ED Triage Notes (Signed)
 Patient presents with right leg pain and aching for several weeks. Patient denies any recent falls. Patient has been taking muscle relaxer at night with no relief.

## 2024-07-02 NOTE — Telephone Encounter (Signed)
 Aware, will watch for correspondence Thanks for seeing her  Agree with ER/UC precautions

## 2024-07-02 NOTE — Telephone Encounter (Signed)
 Pt scheduled with Padonda, NP Friday 07/04/24

## 2024-07-02 NOTE — Telephone Encounter (Signed)
 FYI Only or Action Required?: FYI only for provider: appointment scheduled on 07/04/2024.  Patient was last seen in primary care on 03/20/2024 by Randeen Laine LABOR, MD.  Called Nurse Triage reporting Leg Pain.  Symptoms began a week ago.  Interventions attempted: Ice/heat application.  Symptoms are: gradually worsening.  Triage Disposition: See PCP When Office is Open (Within 3 Days)  Patient/caregiver understands and will follow disposition?: Yes    Copied from CRM #[id number]. Topic: Clinical - Red Word Triage >> Jul 02, 2024 11:59 AM Rea ORN wrote: Red Word that prompted transfer to Nurse Triage: Leg and buttocks pain. Pt in pain when laying down. Reason for Disposition  [1] MODERATE pain (e.g., interferes with normal activities, limping) AND [2] present > 3 days    Per patient's daughter the pain is severe but has not taken pain relievers for symptoms.  Answer Assessment - Initial Assessment Questions 1. ONSET: When did the pain start?      Last week  2. LOCATION: Where is the pain located?      Between her legs and buttocks area  3. PAIN: How bad is the pain?    (Scale 1-10; or mild, moderate, severe)     Severe  4. CAUSE: What do you think is causing the leg pain?     Unsure  5. OTHER SYMPTOMS: Do you have any other symptoms? (e.g., chest pain, back pain, breathing difficulty, swelling, rash, fever, numbness, weakness)     Denies    This RN spoke with the patient's daughter regarding symptoms. She states she is unable to sleep or sit comfortably. Patient has used heat for symptoms with no relief.  Protocols used: Leg Pain-A-AH

## 2024-07-02 NOTE — Discharge Instructions (Addendum)
 You were seen today for pain in your right leg and hip that has been getting worse over the past few days. Your exam today does not show signs of a blood clot such as swelling, redness, or tenderness, and your circulation appears normal. Since your symptoms are most likely caused by inflammation or a musculoskeletal issue, you received a steroid injection in the clinic to help reduce inflammation and pain. You will start an oral steroid tomorrow. Because you have diabetes, these medications may cause your blood sugar to run higher for a few days. Continue monitoring your glucose closely and drink plenty of water to stay hydrated.  For pain, you may take Tylenol  (acetaminophen ) 1000 mg every six hours as needed. This equals two 500 mg tablets at a time. Be careful not to take more than 4000 mg of Tylenol  in a 24-hour period. Do not take ibuprofen, Aleve, or other NSAIDs because of your kidney disease. Try to rest the affected leg, avoid heavy lifting, and use your cane for support to prevent strain. Gentle stretching or applying warm compresses to the hip or thigh may also help ease discomfort.  You also mentioned some vaginal pain and pressure. Your exam today did not show any signs of bladder prolapse or infection. If the discomfort continues or worsens, please discuss this with your primary care provider at your upcoming appointment. They can decide if further testing or a referral to a specialist is needed.  Keep your scheduled appointment with your primary care provider at [facility] on Friday, October 31st, at 8:00 a.m. for follow-up. Seek medical attention sooner if your pain becomes severe, your leg becomes swollen, red, or warm, you develop chest pain or shortness of breath, or you notice any sudden changes in strength, numbness, or your ability to walk. Go to the emergency department immediately if these symptoms occur.

## 2024-07-02 NOTE — ED Provider Notes (Signed)
 [facility]    CSN: [id number] Arrival date & time: 07/02/24  1345      History   Chief Complaint Chief Complaint  Patient presents with   Hip Pain   Leg Pain    HPI Kathy Cunningham is a 88 y.o. female.   Discussed the use of AI scribe software for clinical note transcription with the patient, who gave verbal consent to proceed.   The patient presents with right-sided leg and hip pain that has been present for several weeks and has progressively worsened over the past three days. She describes the pain as involving the entire right leg, radiating up into the hip, and severe enough to cause difficulty walking. She uses a cane for stability. No injury or fall.   The patient also reports vaginal pain but denies any associated vaginal discharge. She notes a history of severe constipation about two months ago that caused both abdominal and back pain, though this has since resolved. She initially worried her current symptoms might be related to a blood clot but reports taking Plavix  daily.  She denies lower back pain, dysuria, urinary frequency, vaginal discharge, current constipation or diarrhea, nausea, vomiting, numbness, tingling, or swelling of the legs, feet, or ankles. She tried taking half of a 10 mg Flexeril  tablet earlier this morning with no relief and has not used any other treatments.  She contacted her primary care provider earlier today and has a follow-up appointment scheduled for Friday. Her medical history includes peripheral vascular disease with a vascular device placed last year, type 2 diabetes (most recent A1c 6.4%), hyperlipidemia, stage 3B chronic kidney disease, and osteoporosis.  The following sections of the patient's history were reviewed and updated as appropriate: allergies, current medications, past family history, past medical history, past social history, past surgical history, and problem list.     Past Medical History:  Diagnosis Date    Anemia    Arthritis    hands   Diabetes mellitus    type II   Dizziness    or vertigo   Glaucoma    Hyperlipidemia    Hypertension    Posterior auricular lymphadenopathy    Right - comes and goes    Patient Active Problem List   Diagnosis Date Noted   Right buttock pain 03/20/2024   Post-viral cough syndrome 12/07/2023   Chronic kidney disease (CKD) stage G3b/A1, moderately decreased glomerular filtration rate (GFR) between 30-44 mL/min/1.73 square meter and albuminuria creatinine ratio less than 30 mg/g (HCC) 09/21/2023   PVD (peripheral vascular disease) 12/08/2022   Ingrown nail of great toe 12/08/2022   Hearing loss 10/22/2019   Tinnitus aurium, right 09/26/2019   Routine general medical examination at a health care facility 06/15/2015   Estrogen deficiency 06/15/2015   Breast cancer screening 12/17/2013   Encounter for screening mammogram for breast cancer 04/24/2011   Glaucoma 04/24/2011   Vitamin D  deficiency 04/24/2011   GAD (generalized anxiety disorder) 10/20/2010   HYPOKALEMIA 10/22/2009   Other thalassemia    Osteoporosis 05/01/2008   Diabetes mellitus treated with oral medication (HCC) 07/17/2007   Hyperlipidemia associated with type 2 diabetes mellitus (HCC) 07/17/2007   Anemia 07/17/2007   Essential hypertension 07/17/2007   PREMATURE VENTRICULAR CONTRACTIONS, FREQUENT 07/17/2007   UTI'S, RECURRENT 07/17/2007    Past Surgical History:  Procedure Laterality Date   bladder papilloma  2006   CATARACT EXTRACTION W/PHACO Left 01/17/2022   Procedure: CATARACT EXTRACTION PHACO AND INTRAOCULAR LENS PLACEMENT (IOC) LEFT DIABETIC RAYNOR  LENS;  Surgeon: Jaye Fallow, MD;  Location: [facility];  Service: Ophthalmology;  Laterality: Left;  Diabetic 19.50 02:06.5   CATARACT EXTRACTION W/PHACO Right 01/31/2022   Procedure: CATARACT EXTRACTION PHACO AND INTRAOCULAR LENS PLACEMENT (IOC) RIGHT DIABETIC RAYNOR LENS;  Surgeon: Jaye Fallow, MD;   Location: [facility];  Service: Ophthalmology;  Laterality: Right;  10.55 0:56.4   ROTATOR CUFF REPAIR  01/2002   vaginal hysterectomy      OB History   No obstetric history on file.      Home Medications    Prior to Admission medications   Medication Sig Start Date End Date Taking? Authorizing Provider  Accu-Chek Softclix Lancets lancets Use as instructed 05/24/22  Yes Tower, Laine LABOR, MD  amLODipine  (NORVASC ) 10 MG tablet Take 1 tablet (10 mg total) by mouth daily. 08/21/23  Yes Tower, Laine LABOR, MD  aspirin EC 81 MG tablet Take 81 mg by mouth daily. Swallow whole.   Yes [provider]  brimonidine  (ALPHAGAN ) 0.2 % ophthalmic solution Place 1 drop into both eyes daily. 03/04/24  Yes [provider]  Cal Carb-Mag Hydrox-Simeth (ROLAIDS ADVANCED PO) Take 1 tablet by mouth as needed (indigestion).   Yes [provider]  calcium  carbonate (TUMS - DOSED IN MG ELEMENTAL CALCIUM ) 500 MG chewable tablet Chew 1 tablet by mouth daily as needed for indigestion or heartburn.   Yes [provider]  cholecalciferol (VITAMIN D ) 1000 UNITS tablet Take 2,000 Units by mouth daily.    Yes [provider]  clopidogrel  (PLAVIX ) 75 MG tablet Take 1 tablet (75 mg total) by mouth daily. 04/11/24  Yes Tower, Marne A, MD  glucose blood (ACCU-CHEK GUIDE) test strip Use as instructed 02/02/22  Yes Tower, Marne A, MD  Glucose Blood (BLOOD GLUCOSE TEST STRIPS) STRP 1 each by In Vitro route daily. May substitute to any manufacturer covered by patient's insurance. 03/20/24  Yes Tower, Laine LABOR, MD  JARDIANCE  10 MG TABS tablet TAKE 1 TABLET BY MOUTH DAILY BEFORE BREAKFAST. 06/02/24  Yes Tower, Laine LABOR, MD  Lancets Misc. MISC 1 each by Does not apply route daily. May substitute to any manufacturer covered by patient's insurance. 03/20/24  Yes Tower, Laine LABOR, MD  latanoprost (XALATAN) 0.005 % ophthalmic solution Place 1 drop into both eyes at bedtime. 08/03/23  Yes [provider]  metoprolol  tartrate (LOPRESSOR ) 100 MG tablet Take 1 tablet (100 mg total) by mouth 2 (two) times daily. 08/21/23  Yes Tower, Laine LABOR, MD  olmesartan  (BENICAR ) 40 MG tablet Take 1 tablet (40 mg total) by mouth daily. 08/21/23  Yes Tower, Laine LABOR, MD  potassium chloride  SA (KLOR-CON  M) 20 MEQ tablet TAKE 1 TABLET BY MOUTH DAILY 01/18/24  Yes Tower, Laine LABOR, MD  predniSONE (DELTASONE) 10 MG tablet Take 4 tablets (40 mg total) by mouth daily with breakfast for 1 day, THEN 3 tablets (30 mg total) daily with breakfast for 1 day, THEN 2 tablets (20 mg total) daily with breakfast for 1 day, THEN 1 tablet (10 mg total) daily with breakfast for 1 day. 07/02/24 07/06/24 Yes Iola Lukes, FNP  rosuvastatin  (CRESTOR ) 20 MG tablet TAKE 1 TABLET BY MOUTH DAILY AT  NOON 11/21/23  Yes Tower, Laine LABOR, MD  alendronate  (FOSAMAX ) 70 MG tablet Take 1 tablet (70 mg total) by mouth every 7 (seven) days. Take with a full glass of water on an empty stomach. 08/21/23   Tower, Laine LABOR, MD  Blood Glucose Monitoring Suppl DEVI  1 each by Does not apply route daily. May substitute to any manufacturer covered by patient's insurance. 03/20/24   Tower, Laine LABOR, MD    Family History Family History  Problem Relation Age of Onset   Heart disease Mother        MI   Heart attack Mother    Hypertension Mother    Heart disease Father        MI   Heart attack Father    Hypertension Father    Cancer Brother        pancreatic     Social History Social History   Tobacco Use   Smoking status: Never   Smokeless tobacco: Never  Vaping Use   Vaping status: Never Used  Substance Use Topics   Alcohol use: No    Alcohol/week: 0.0 standard drinks of alcohol   Drug use: No     Allergies   Atorvastatin, Risedronate sodium, and Sulfonamide derivatives   Review of Systems Review of Systems  Cardiovascular:  Negative for leg swelling.  Gastrointestinal:  Negative for constipation, diarrhea, nausea and  vomiting.  Genitourinary:  Positive for vaginal pain. Negative for dysuria and vaginal discharge.  Musculoskeletal:  Positive for arthralgias (right lower leg, right hip) and gait problem (due to the pain). Negative for back pain and joint swelling.  Neurological:  Negative for numbness.  All other systems reviewed and are negative.    Physical Exam Triage Vital Signs ED Triage Vitals  Encounter Vitals Group     BP 07/02/24 1501 125/61     Girls Systolic BP Percentile --      Girls Diastolic BP Percentile --      Boys Systolic BP Percentile --      Boys Diastolic BP Percentile --      Pulse Rate 07/02/24 1501 85     Resp 07/02/24 1501 18     Temp 07/02/24 1501 98.1 F (36.7 C)     Temp Source 07/02/24 1501 Oral     SpO2 07/02/24 1501 97 %     Weight --      Height --      Head Circumference --      Peak Flow --      Pain Score 07/02/24 1500 8     Pain Loc --      Pain Education --      Exclude from Growth Chart --    No data found.  Updated Vital Signs BP 125/61 (BP Location: Left Arm)   Pulse 85   Temp 98.1 F (36.7 C) (Oral)   Resp 18   SpO2 97%   Visual Acuity Right Eye Distance:   Left Eye Distance:   Bilateral Distance:    Right Eye Near:   Left Eye Near:    Bilateral Near:     Physical Exam Vitals reviewed. Exam conducted with a chaperone present Beacher Molly, CMA).  Constitutional:      General: She is awake. She is not in acute distress.    Appearance: Normal appearance. She is well-developed. She is not ill-appearing, toxic-appearing or diaphoretic.  HENT:     Head: Normocephalic.     Right Ear: Hearing normal.     Left Ear: Hearing normal.     Nose: Nose normal.     Mouth/Throat:     Mouth: Mucous membranes are moist.  Eyes:     General: Vision grossly intact.     Conjunctiva/sclera: Conjunctivae normal.  Cardiovascular:  Rate and Rhythm: Normal rate and regular rhythm.     Heart sounds: Normal heart sounds.  Pulmonary:     Effort:  Pulmonary effort is normal.     Breath sounds: Normal breath sounds and air entry.  Genitourinary:    Comments: Brief external and non-speculum vaginal exam performed with patient in a comfortable supine position. External genitalia without erythema, lesions, or discharge. Vaginal mucosa appears normal in color and without visible lesions. No evidence of bladder or uterine prolapse observed on inspection, including during bearing down maneuver. No vaginal bulging or tissue descent noted. No abnormal discharge or odor present.  Musculoskeletal:        General: Normal range of motion.     Cervical back: Normal range of motion and neck supple.     Right hip: Normal. No tenderness. Normal range of motion.     Right knee: Normal.     Right lower leg: Normal. No swelling, deformity, tenderness or bony tenderness. No edema.     Right ankle: Normal. No swelling.     Right foot: Normal.  Skin:    General: Skin is warm and dry.  Neurological:     General: No focal deficit present.     Mental Status: She is alert and oriented to person, place, and time.     Sensory: Sensation is intact. No sensory deficit.     Motor: Motor function is intact.     Gait: Gait is intact.  Psychiatric:        Speech: Speech normal.        Behavior: Behavior is cooperative.      UC Treatments / Results  Labs (all labs ordered are listed, but only abnormal results are displayed) Labs Reviewed - No data to display  EKG   Radiology No results found.  Procedures Procedures (including critical care time)  Medications Ordered in UC Medications  dexamethasone (DECADRON) injection 10 mg (10 mg Intramuscular Given 07/02/24 1702)    Initial Impression / Assessment and Plan / UC Course  I have reviewed the triage vital signs and the nursing notes.  Pertinent labs & imaging results that were available during my care of the patient were reviewed by me and considered in my medical decision making (see chart for  details).     The patient presents with right-sided leg and hip pain that has been ongoing for several weeks, with worsening over the past three days. The pain involves the entire right leg, extending into the hip and posterior aspect of the leg. She was concerned about a possible blood clot, but examination shows no swelling, redness, or tenderness to suggest deep vein thrombosis. The patient takes Plavix  daily for peripheral vascular disease prevention following a vascular device placement last year and has no recent travel, surgery, or hormonal therapy to increase clotting risk. The pain is most consistent with a musculoskeletal etiology given its distribution and absence of vascular findings.  A steroid injection was administered in the clinic for anti-inflammatory benefit, and an oral steroid burst will begin tomorrow. The patient was counseled that steroids can temporarily elevate blood glucose levels, but this is acceptable given her well-controlled diabetes (A1c 6.4%). She was advised to monitor her glucose closely during treatment. For pain control, she may take two extra-strength Tylenol  (500 mg tablets) every six hours as needed, but she should avoid ibuprofen or any NSAIDs due to her stage 3B chronic kidney disease. She was instructed to keep her scheduled primary care appointment on  Friday, October 31st, at 8:00 a.m. with [facility] for further evaluation and follow-up.  Regarding her vaginal pain and pressure sensation, concern was raised for possible pelvic organ prolapse, particularly bladder prolapse. Physical examination, including bearing down maneuver, revealed no evidence of prolapse, and there was no vaginal discharge, odor, or dysuria. She was advised to follow up with her primary care provider if symptoms persist or worsen for further evaluation, which may include referral to gynecology if needed.  Today's evaluation has revealed no signs of a dangerous process. Discussed diagnosis  with patient and/or guardian. Patient and/or guardian aware of their diagnosis, possible red flag symptoms to watch out for and need for close follow up. Patient and/or guardian understands verbal and written discharge instructions. Patient and/or guardian comfortable with plan and disposition.  Patient and/or guardian has a clear mental status at this time, good insight into illness (after discussion and teaching) and has clear judgment to make decisions regarding their care  Documentation was completed with the aid of voice recognition software. Transcription may contain typographical errors.  Final Clinical Impressions(s) / UC Diagnoses   Final diagnoses:  Right leg pain  Pain of right hip  Vaginal pain     Discharge Instructions      You were seen today for pain in your right leg and hip that has been getting worse over the past few days. Your exam today does not show signs of a blood clot such as swelling, redness, or tenderness, and your circulation appears normal. Since your symptoms are most likely caused by inflammation or a musculoskeletal issue, you received a steroid injection in the clinic to help reduce inflammation and pain. You will start an oral steroid tomorrow. Because you have diabetes, these medications may cause your blood sugar to run higher for a few days. Continue monitoring your glucose closely and drink plenty of water to stay hydrated.  For pain, you may take Tylenol  (acetaminophen ) 1000 mg every six hours as needed. This equals two 500 mg tablets at a time. Be careful not to take more than 4000 mg of Tylenol  in a 24-hour period. Do not take ibuprofen, Aleve, or other NSAIDs because of your kidney disease. Try to rest the affected leg, avoid heavy lifting, and use your cane for support to prevent strain. Gentle stretching or applying warm compresses to the hip or thigh may also help ease discomfort.  You also mentioned some vaginal pain and pressure. Your exam today  did not show any signs of bladder prolapse or infection. If the discomfort continues or worsens, please discuss this with your primary care provider at your upcoming appointment. They can decide if further testing or a referral to a specialist is needed.  Keep your scheduled appointment with your primary care provider at [facility] on Friday, October 31st, at 8:00 a.m. for follow-up. Seek medical attention sooner if your pain becomes severe, your leg becomes swollen, red, or warm, you develop chest pain or shortness of breath, or you notice any sudden changes in strength, numbness, or your ability to walk. Go to the emergency department immediately if these symptoms occur.     ED Prescriptions     Medication Sig Dispense Auth. Provider   predniSONE (DELTASONE) 10 MG tablet Take 4 tablets (40 mg total) by mouth daily with breakfast for 1 day, THEN 3 tablets (30 mg total) daily with breakfast for 1 day, THEN 2 tablets (20 mg total) daily with breakfast for 1 day, THEN 1 tablet (10 mg total)  daily with breakfast for 1 day. 10 tablet Iola Lukes, FNP      PDMP not reviewed this encounter.   Iola Lukes, [place] 07/02/24 1705

## 2024-07-04 ENCOUNTER — Ambulatory Visit: Admitting: Family

## 2024-07-04 ENCOUNTER — Encounter: Payer: Self-pay | Admitting: Family

## 2024-07-04 VITALS — BP 134/64 | HR 98 | Temp 97.9°F | Ht 59.5 in | Wt 117.0 lb

## 2024-07-04 DIAGNOSIS — M7918 Myalgia, other site: Secondary | ICD-10-CM

## 2024-07-04 DIAGNOSIS — Z23 Encounter for immunization: Secondary | ICD-10-CM | POA: Diagnosis not present

## 2024-07-04 DIAGNOSIS — M5416 Radiculopathy, lumbar region: Secondary | ICD-10-CM

## 2024-07-04 NOTE — Progress Notes (Signed)
 Acute Office Visit  Subjective:     Patient ID: Kathy Cunningham, female    DOB: Jun 26, 1936, 88 y.o.   MRN: [id number]  Chief Complaint  Patient presents with  . Buttock Pain    C/o buttocks pain. Started about 2 yrs ago- worsening.     HPI Patient is in today with c/o pain in her lower back that radiates to her right buttocks and sometimes down her leg. The pain has been intermittent for about 2 years off and on. But this time the pain has not let up. She describes it as sharp, 8/10, worse with movement.   Review of Systems  Constitutional: Negative.   Respiratory: Negative.    Cardiovascular: Negative.   Musculoskeletal:  Positive for back pain.       Pain radiates to the right buttocks  Neurological:  Negative for weakness.  Psychiatric/Behavioral: Negative.    All other systems reviewed and are negative.   Past Medical History:  Diagnosis Date  . Anemia   . Arthritis    hands  . Diabetes mellitus    type II  . Dizziness    or vertigo  . Glaucoma   . Hyperlipidemia   . Hypertension   . Posterior auricular lymphadenopathy    Right - comes and goes    Social History   Socioeconomic History  . Marital status: Widowed    Spouse name: Not on file  . Number of children: Not on file  . Years of education: Not on file  . Highest education level: Not on file  Occupational History  . Not on file  Tobacco Use  . Smoking status: Never  . Smokeless tobacco: Never  Vaping Use  . Vaping status: Never Used  Substance and Sexual Activity  . Alcohol use: No    Alcohol/week: 0.0 standard drinks of alcohol  . Drug use: No  . Sexual activity: Never  Other Topics Concern  . Not on file  Social History Narrative  . Not on file   Social Drivers of Health   Financial Resource Strain: Low Risk  (08/09/2023)   Overall Financial Resource Strain (CARDIA)   . Difficulty of Paying Living Expenses: Not hard at all  Food Insecurity: No Food Insecurity (08/09/2023)   Hunger  Vital Sign   . Worried About Programme Researcher, Broadcasting/film/video in the Last Year: Never true   . Ran Out of Food in the Last Year: Never true  Transportation Needs: No Transportation Needs (08/09/2023)   PRAPARE - Transportation   . Lack of Transportation (Medical): No   . Lack of Transportation (Non-Medical): No  Physical Activity: Insufficiently Active (08/09/2023)   Exercise Vital Sign   . Days of Exercise per Week: 2 days   . Minutes of Exercise per Session: 30 min  Stress: No Stress Concern Present (08/09/2023)   [employer] of Occupational Health - Occupational Stress Questionnaire   . Feeling of Stress : Not at all  Social Connections: Moderately Isolated (08/09/2023)   Social Connection and Isolation Panel   . Frequency of Communication with Friends and Family: Once a week   . Frequency of Social Gatherings with Friends and Family: Twice a week   . Attends Religious Services: More than 4 times per year   . Active Member of Clubs or Organizations: No   . Attends Banker Meetings: Never   . Marital Status: Widowed  Intimate Partner Violence: Not At Risk (08/09/2023)   Humiliation, Afraid, Rape, and  Kick questionnaire   . Fear of Current or Ex-Partner: No   . Emotionally Abused: No   . Physically Abused: No   . Sexually Abused: No    Past Surgical History:  Procedure Laterality Date  . bladder papilloma  2006  . CATARACT EXTRACTION W/PHACO Left 01/17/2022   Procedure: CATARACT EXTRACTION PHACO AND INTRAOCULAR LENS PLACEMENT (IOC) LEFT DIABETIC RAYNOR LENS;  Surgeon: Jaye Fallow, MD;  Location: [facility];  Service: Ophthalmology;  Laterality: Left;  Diabetic 19.50 02:06.5  . CATARACT EXTRACTION W/PHACO Right 01/31/2022   Procedure: CATARACT EXTRACTION PHACO AND INTRAOCULAR LENS PLACEMENT (IOC) RIGHT DIABETIC RAYNOR LENS;  Surgeon: Jaye Fallow, MD;  Location: [facility];  Service: Ophthalmology;  Laterality: Right;  10.55 0:56.4  . ROTATOR CUFF  REPAIR  01/2002  . vaginal hysterectomy      Family History  Problem Relation Age of Onset  . Heart disease Mother        MI  . Heart attack Mother   . Hypertension Mother   . Heart disease Father        MI  . Heart attack Father   . Hypertension Father   . Cancer Brother        pancreatic     Allergies  Allergen Reactions  . Atorvastatin Other (See Comments)    REACTION: cramps  . Risedronate Sodium Nausea And Vomiting and Other (See Comments)  . Sulfonamide Derivatives Rash    Current Outpatient Medications on File Prior to Visit  Medication Sig Dispense Refill  . Accu-Chek Softclix Lancets lancets Use as instructed 100 each 3  . alendronate  (FOSAMAX ) 70 MG tablet Take 1 tablet (70 mg total) by mouth every 7 (seven) days. Take with a full glass of water on an empty stomach. 12 tablet 3  . amLODipine  (NORVASC ) 10 MG tablet Take 1 tablet (10 mg total) by mouth daily. 100 tablet 3  . aspirin EC 81 MG tablet Take 81 mg by mouth daily. Swallow whole.    . Blood Glucose Monitoring Suppl DEVI 1 each by Does not apply route daily. May substitute to any manufacturer covered by patient's insurance. 1 each 0  . brimonidine  (ALPHAGAN ) 0.2 % ophthalmic solution Place 1 drop into both eyes daily.    . Cal Carb-Mag Hydrox-Simeth (ROLAIDS ADVANCED PO) Take 1 tablet by mouth as needed (indigestion).    . calcium  carbonate (TUMS - DOSED IN MG ELEMENTAL CALCIUM ) 500 MG chewable tablet Chew 1 tablet by mouth daily as needed for indigestion or heartburn.    . cholecalciferol (VITAMIN D ) 1000 UNITS tablet Take 2,000 Units by mouth daily.     . clopidogrel  (PLAVIX ) 75 MG tablet Take 1 tablet (75 mg total) by mouth daily. 90 tablet 2  . glucose blood (ACCU-CHEK GUIDE) test strip Use as instructed 100 each 3  . Glucose Blood (BLOOD GLUCOSE TEST STRIPS) STRP 1 each by In Vitro route daily. May substitute to any manufacturer covered by patient's insurance. 100 strip 3  . JARDIANCE  10 MG TABS tablet  TAKE 1 TABLET BY MOUTH DAILY BEFORE BREAKFAST. 90 tablet 1  . Lancets Misc. MISC 1 each by Does not apply route daily. May substitute to any manufacturer covered by patient's insurance. 100 each 3  . latanoprost (XALATAN) 0.005 % ophthalmic solution Place 1 drop into both eyes at bedtime.    . metoprolol  tartrate (LOPRESSOR ) 100 MG tablet Take 1 tablet (100 mg total) by mouth 2 (two) times daily. 200 tablet 3  .  olmesartan  (BENICAR ) 40 MG tablet Take 1 tablet (40 mg total) by mouth daily. 100 tablet 3  . potassium chloride  SA (KLOR-CON  M) 20 MEQ tablet TAKE 1 TABLET BY MOUTH DAILY 100 tablet 2  . rosuvastatin  (CRESTOR ) 20 MG tablet TAKE 1 TABLET BY MOUTH DAILY AT  NOON 100 tablet 2   Current Facility-Administered Medications on File Prior to Visit  Medication Dose Route Frequency Provider Last Rate Last Admin  . denosumab  (PROLIA ) injection 60 mg  60 mg Subcutaneous Once Tower, Laine A, MD        BP 134/64   Pulse 98   Temp 97.9 F (36.6 C) (Oral)   Ht 4' 11.5 (1.511 m)   Wt 117 lb (53.1 kg)   SpO2 99%   BMI 23.24 kg/m chart     Objective:    BP 134/64   Pulse 98   Temp 97.9 F (36.6 C) (Oral)   Ht 4' 11.5 (1.511 m)   Wt 117 lb (53.1 kg)   SpO2 99%   BMI 23.24 kg/m    Physical Exam Vitals and nursing note reviewed.  Constitutional:      Appearance: Normal appearance. She is normal weight.  Cardiovascular:     Rate and Rhythm: Normal rate and regular rhythm.     Pulses: Normal pulses.     Heart sounds: Normal heart sounds.  Pulmonary:     Effort: Pulmonary effort is normal.     Breath sounds: Normal breath sounds.  Musculoskeletal:        General: Tenderness present.     Cervical back: Normal range of motion and neck supple.     Right lower leg: No edema.     Left lower leg: No edema.     Comments: Tenderness to palpation midline, lower back. Mild pain with flexion and rotation. Negative SLR  Skin:    General: Skin is warm.  Neurological:     General: No focal  deficit present.     Mental Status: She is alert and oriented to person, place, and time. Mental status is at baseline.  Psychiatric:        Mood and Affect: Mood normal.        Behavior: Behavior normal.        Thought Content: Thought content normal.    No results found for any visits on 07/04/24.      Assessment & Plan:   Problem List Items Addressed This Visit     Right buttock pain   Relevant Orders   CT Lumbar Spine W Contrast   Other Visit Diagnoses       Lumbar radiculopathy, chronic    -  Primary   Relevant Orders   CT Lumbar Spine W Contrast     Encounter for immunization       Relevant Orders   Flu vaccine HIGH DOSE PF(Fluzone Trivalent) (Completed)      Call the office if symptoms worsen persist. Recheck as scheduled and sooner as needed.  No orders of the defined types were placed in this encounter.   No follow-ups on file.  Marli Diego B Kaavya Puskarich, FNP

## 2024-07-07 ENCOUNTER — Ambulatory Visit: Payer: Self-pay

## 2024-07-07 ENCOUNTER — Other Ambulatory Visit: Payer: Self-pay | Admitting: Family Medicine

## 2024-07-07 MED ORDER — PREDNISONE 10 MG PO TABS: ORAL_TABLET | ORAL | 0 refills | Status: DC

## 2024-07-07 NOTE — Telephone Encounter (Signed)
 FYI Only or Action Required?: Action required by provider: medication refill request.  Patient was last seen in primary care on 07/04/2024 by Douglass Kenney NOVAK, FNP.  Called Nurse Triage reporting Advice Only.  Symptoms began a week ago.  Interventions attempted: OTC medications: tylenol .  Symptoms are: unchanged.  Triage Disposition: Call PCP Now  Patient/caregiver understands and will follow disposition?: Yes    Copied from CRM #[id number]. Topic: Clinical - Red Word Triage >> Jul 07, 2024  8:48 AM Darshell M wrote: Red Word that prompted transfer to Nurse Triage: I was processing the prednisone request. Patient's niece indicating the pain has ramped up.    ----------------------------------------------------------------------- From previous Reason for Contact - Medication Question: Reason for CRM: Provide indicating in the last visit that if patient needed more prednisone for the pain/sciatica she could call in to request the prescription. CB# [phone]   This is the patient's preferred pharmacy:  [id number] [place], [place] - [name] [place] [address] Phone: [phone] Fax: [phone]   Is Reason for Disposition  [1] Caller has URGENT question (includes prescribed medication questions) AND [2] triager unable to answer question  Answer Assessment - Initial Assessment Questions She was seen by Padonda on 07/04/24 and patient says she was told to call back for a refill of prednisone if she needed it. She says her pain is 9/10 and it gets worse as the medication wears off. Tylenol  and prednisone 6 hours apart, took last dose of prednisone yesterday and the pain is worsening. Advised I will send this to the provider and someone will call with the recommendation. She verbalized understanding.   1. MAIN CONCERN OR SYMPTOM:  What is your main concern right now? What question do you have? What's the  main symptom you're worried about? (e.g., breathing difficulty, cough, fever, pain)     Back pain  2. ONSET: When did the symptoms start?     Sunday a week ago  3. BETTER-SAME-WORSE: Are you getting better, staying the same, or getting worse compared to how you felt at your last visit to the doctor (most recent medical visit)?     Same-pain level 9/10  4. VISIT DATE: When were you seen? (e.g., date)     10 /31/25  5. VISIT DOCTOR: What is the name of the doctor taking care of you now?     Kenney Douglass, NP  6. VISIT DIAGNOSIS:  What was the main symptom or problem that you were seen for? Were you given a diagnosis?      Lower back pain  7. VISIT MEDICINES: Did the doctor order any new medicines for you to use? If Yes, ask: Have you filled the prescription and started taking the medicine?      Prednisone prescribed by UC on 07/02/24  8. PAIN: Is there any pain? If Yes, ask: How bad is it?  (Scale 0-10; or none, mild, moderate, severe)     9  9. FEVER: Do you have a fever? If Yes, ask: What is it, how was it measured  and when did it start?       No  10. OTHER SYMPTOMS: Do you have any other symptoms?       Pain down right leg  Protocols used: Recent Medical Visit for Illness Follow-up Call-A-[facility]

## 2024-07-07 NOTE — Telephone Encounter (Signed)
 Copied from CRM #[id number]. Topic: Clinical - Medication Question >> Jul 07, 2024  4:36 PM Dedra B wrote: Reason for CRM: Pt returning call for Dora Simeone regarding prescription for sciatica pain.

## 2024-07-07 NOTE — Telephone Encounter (Signed)
 Left VM requesting family to call us  back

## 2024-07-07 NOTE — Telephone Encounter (Signed)
 Pt notified of Dr. Graham comments. Pt said she took some tylenol  and it helped the pain but will pick up the prednisone in case her pain comes back

## 2024-07-07 NOTE — Telephone Encounter (Signed)
Left VM requesting pt/family to call the office back  

## 2024-07-07 NOTE — Telephone Encounter (Signed)
 I sent prednisone taper 40 mg   (instructions are slightly different from UC so follow them)   Ct was ordered by Kenney Roys -hope to get that soon   [facility] this helps  Keep us  posted

## 2024-07-09 ENCOUNTER — Other Ambulatory Visit: Payer: Self-pay | Admitting: Family Medicine

## 2024-07-10 NOTE — Telephone Encounter (Unsigned)
 Copied from CRM #[id number]. Topic: General - Other >> Jul 10, 2024  1:51 PM Aisha D wrote: Reason for CRM: Pt is returning a missed call from [place]. I informed the pt that Curt[place] is currently at lunch and will callback once available.

## 2024-07-14 ENCOUNTER — Other Ambulatory Visit: Payer: Self-pay | Admitting: Family Medicine

## 2024-07-14 DIAGNOSIS — Z1231 Encounter for screening mammogram for malignant neoplasm of breast: Secondary | ICD-10-CM

## 2024-07-15 ENCOUNTER — Telehealth: Payer: Self-pay | Admitting: Family Medicine

## 2024-07-15 NOTE — Telephone Encounter (Signed)
 Kathy Webb, NP ordered this will route to her and PCP for review

## 2024-07-15 NOTE — Telephone Encounter (Signed)
 I think the question from imaging is whether this should be a myelogram or not  I assume it is ?  If so do I need to re order ?  Thanks

## 2024-07-15 NOTE — Telephone Encounter (Signed)
 Copied from CRM [id number]. Topic: Referral - Status >> Jul 15, 2024  9:16 AM Macario HERO wrote: Reason for CRM: Patient daughter called regarding status of referral (Lumbar radiculopathy, chronic) - stated patient is in pain and needs to be seen as soon as possible. - Requesting a call back to patient home with a status update.

## 2024-07-15 NOTE — Telephone Encounter (Addendum)
 There are notes on the appt desk and within the referral on this CT scan... A message was sent to the Provider by [facility] needing order clarification and the order also states that No Auth Required... it looks like [facility] is holding the order and not scheduling due to needing the order clarification from Dr Randeen.   See appt desk notes -- Dr Lacy Roys, NP should have gotten a message from someone at [facility]  I have spoken with the patient and given her an update.   If you need assistance in finding notes within referrals and on the appt desk please let myself of Fronie Moats know so that we can show you.

## 2024-07-15 NOTE — Telephone Encounter (Signed)
 Her last GFR was 36.9-please check in with radiology and see if they can do CT myelogram (which requires contrast) with that GFR or if it is too low  Thanks

## 2024-07-15 NOTE — Telephone Encounter (Signed)
 I have reached out via teams to follow up on this.

## 2024-07-16 NOTE — Telephone Encounter (Signed)
 Thanks- please tell radiology - CT without contrast/not myelogram Thanks

## 2024-07-16 NOTE — Telephone Encounter (Signed)
 They are aware and have changed order

## 2024-07-23 ENCOUNTER — Ambulatory Visit
Admission: RE | Admit: 2024-07-23 | Discharge: 2024-07-23 | Disposition: A | Source: Ambulatory Visit | Attending: Family | Admitting: Family

## 2024-07-23 DIAGNOSIS — M5416 Radiculopathy, lumbar region: Secondary | ICD-10-CM

## 2024-07-23 DIAGNOSIS — M7918 Myalgia, other site: Secondary | ICD-10-CM

## 2024-07-27 ENCOUNTER — Ambulatory Visit: Payer: Self-pay | Admitting: Family

## 2024-07-27 ENCOUNTER — Other Ambulatory Visit: Payer: Self-pay | Admitting: Family

## 2024-07-27 DIAGNOSIS — M51369 Other intervertebral disc degeneration, lumbar region without mention of lumbar back pain or lower extremity pain: Secondary | ICD-10-CM

## 2024-08-11 ENCOUNTER — Ambulatory Visit: Payer: Medicare Other

## 2024-08-11 VITALS — BP 134/64 | Ht 59.5 in | Wt 115.0 lb

## 2024-08-11 DIAGNOSIS — Z0000 Encounter for general adult medical examination without abnormal findings: Secondary | ICD-10-CM | POA: Diagnosis not present

## 2024-08-11 LAB — OPHTHALMOLOGY REPORT-SCANNED

## 2024-08-11 NOTE — Patient Instructions (Signed)
 Ms. Delatorre,  Thank you for taking the time for your Medicare Wellness Visit. I appreciate your continued commitment to your health goals. Please review the care plan we discussed, and feel free to reach out if I can assist you further.  Please note that Annual Wellness Visits do not include a physical exam. Some assessments may be limited, especially if the visit was conducted virtually. If needed, we may recommend an in-person follow-up with your provider.  Ongoing Care Seeing your primary care provider every 3 to 6 months helps us  monitor your health and provide consistent, personalized care.   Referrals If a referral was made during today's visit and you haven't received any updates within two weeks, please contact the referred provider directly to check on the status.  Recommended Screenings:  Health Maintenance  Topic Date Due   Osteoporosis screening with Bone Density Scan  06/23/2023   COVID-19 Vaccine (5 - 2025-26 season) 05/05/2024   Breast Cancer Screening  08/12/2024   Medicare Annual Wellness Visit  08/20/2024   Zoster (Shingles) Vaccine (2 of 2) 12/06/2024*   Eye exam for diabetics  08/23/2024   Complete foot exam   09/20/2024   Hemoglobin A1C  11/04/2024   Pneumococcal Vaccine for age over 40  Completed   Flu Shot  Completed   Meningitis B Vaccine  Aged Out   DTaP/Tdap/Td vaccine  Discontinued  *Topic was postponed. The date shown is not the original due date.       08/11/2024    8:45 AM  Advanced Directives  Does Patient Have a Medical Advance Directive? No  Would patient like information on creating a medical advance directive? No - Patient declined    Vision: Annual vision screenings are recommended for early detection of glaucoma, cataracts, and diabetic retinopathy. These exams can also reveal signs of chronic conditions such as diabetes and high blood pressure.  Dental: Annual dental screenings help detect early signs of oral cancer, gum disease, and other  conditions linked to overall health, including heart disease and diabetes.  Please see the attached documents for additional preventive care recommendations.

## 2024-08-11 NOTE — Progress Notes (Signed)
 I connected with  Kathy Cunningham on 08/11/24 by a audio enabled telemedicine application and verified that I am speaking with the correct person using two identifiers.  Patient Location: Home  Provider Location: Home Office  Persons Participating in Visit: Patient.  I discussed the limitations of evaluation and management by telemedicine. The patient expressed understanding and agreed to proceed.  Vital Signs: Because this visit was a virtual/telehealth visit, some criteria may be missing or patient reported. Any vitals not documented were not able to be obtained and vitals that have been documented are patient reported.   Because this visit was a virtual/telehealth visit,  certain criteria was not obtained, such a blood pressure, CBG if applicable, and timed get up and go. Any medications not marked as taking were not mentioned during the medication reconciliation part of the visit. Any vitals not documented were not able to be obtained due to this being a telehealth visit or patient was unable to self-report a recent blood pressure reading due to a lack of equipment at home via telehealth. Vitals that have been documented are verbally provided by the patient.   This visit was performed by a medical professional under my direct supervision. I was immediately available for consultation/collaboration. I have reviewed and agree with the Annual Wellness Visit documentation.  Chief Complaint  Patient presents with   Medicare Wellness     Subjective:   Kathy Cunningham is a 88 y.o. female who presents for a Medicare Annual Wellness Visit.  Visit info / Clinical Intake: Medicare Wellness Visit Type:: Subsequent Annual Wellness Visit Persons participating in visit and providing information:: patient Medicare Wellness Visit Mode:: Telephone If telephone:: video declined Since this visit was completed virtually, some vitals may be partially provided or unavailable. Missing vitals are due to  the limitations of the virtual format.: Documented vitals are patient reported If Telephone or Video please confirm:: I connected with patient using audio/video enable telemedicine. I verified patient identity with two identifiers, discussed telehealth limitations, and patient agreed to proceed. Patient Location:: home Provider Location:: home office Interpreter Needed?: No Pre-visit prep was completed: yes AWV questionnaire completed by patient prior to visit?: no Living arrangements:: with family/others Patient's Overall Health Status Rating: very good Typical amount of pain: none Does pain affect daily life?: no Are you currently prescribed opioids?: no  Dietary Habits and Nutritional Risks How many meals a day?: 2 Eats fruit and vegetables daily?: yes Most meals are obtained by: preparing own meals In the last 2 weeks, have you had any of the following?: none Diabetic:: no  Functional Status Activities of Daily Living (to include ambulation/medication): Independent Ambulation: Independent Medication Administration: Independent Home Management (perform basic housework or laundry): Independent Manage your own finances?: yes Primary transportation is: driving Concerns about vision?: (!) yes (wears glasses) Concerns about hearing?: no  Fall Screening Falls in the past year?: 0 Number of falls in past year: 0 Was there an injury with Fall?: 0 Fall Risk Category Calculator: 0 Patient Fall Risk Level: Low Fall Risk  Fall Risk Patient at Risk for Falls Due to: No Fall Risks Fall risk Follow up: Falls evaluation completed  Home and Transportation Safety: All rugs have non-skid backing?: N/A, no rugs All stairs or steps have railings?: yes Grab bars in the bathtub or shower?: (!) no Have non-skid surface in bathtub or shower?: (!) no Good home lighting?: yes Regular seat belt use?: yes Hospital stays in the last year:: no  Cognitive Assessment Difficulty concentrating,  remembering, or making decisions? : yes Will 6CIT or Mini Cog be Completed: yes What year is it?: 0 points What month is it?: 0 points Give patient an address phrase to remember (5 components): [address] [place] About what time is it?: 0 points Count backwards from 20 to 1: 0 points Say the months of the year in reverse: 0 points Repeat the address phrase from earlier: 0 points 6 CIT Score: 0 points  Advance Directives (For Healthcare) Does Patient Have a Medical Advance Directive?: No Would patient like information on creating a medical advance directive?: No - Patient declined  Reviewed/Updated  Reviewed/Updated: Reviewed All (Medical, Surgical, Family, Medications, Allergies, Care Teams, Patient Goals)    Allergies (verified) Atorvastatin, Risedronate sodium, and Sulfonamide derivatives   Current Medications (verified) Outpatient Encounter Medications as of 08/11/2024  Medication Sig   Accu-Chek Softclix Lancets lancets Use as instructed   alendronate  (FOSAMAX ) 70 MG tablet TAKE 1 TABLET BY MOUTH EVERY 7  DAYS WITH FULL GLASS OF WATER ON AN EMPTY STOMACH   amLODipine  (NORVASC ) 10 MG tablet TAKE 1 TABLET BY MOUTH DAILY   aspirin EC 81 MG tablet Take 81 mg by mouth daily. Swallow whole.   Blood Glucose Monitoring Suppl DEVI 1 each by Does not apply route daily. May substitute to any manufacturer covered by patient's insurance.   brimonidine  (ALPHAGAN ) 0.2 % ophthalmic solution Place 1 drop into both eyes daily.   Cal Carb-Mag Hydrox-Simeth (ROLAIDS ADVANCED PO) Take 1 tablet by mouth as needed (indigestion).   calcium  carbonate (TUMS - DOSED IN MG ELEMENTAL CALCIUM ) 500 MG chewable tablet Chew 1 tablet by mouth daily as needed for indigestion or heartburn.   cholecalciferol (VITAMIN D ) 1000 UNITS tablet Take 2,000 Units by mouth daily.    clopidogrel  (PLAVIX ) 75 MG tablet Take 1 tablet (75 mg total) by mouth daily.   glucose blood (ACCU-CHEK GUIDE) test strip Use as  instructed   Glucose Blood (BLOOD GLUCOSE TEST STRIPS) STRP 1 each by In Vitro route daily. May substitute to any manufacturer covered by patient's insurance.   JARDIANCE  10 MG TABS tablet TAKE 1 TABLET BY MOUTH DAILY BEFORE BREAKFAST.   Lancets Misc. MISC 1 each by Does not apply route daily. May substitute to any manufacturer covered by patient's insurance.   latanoprost (XALATAN) 0.005 % ophthalmic solution Place 1 drop into both eyes at bedtime.   metoprolol  tartrate (LOPRESSOR ) 100 MG tablet TAKE 1 TABLET BY MOUTH TWICE  DAILY   olmesartan  (BENICAR ) 40 MG tablet TAKE 1 TABLET BY MOUTH DAILY   potassium chloride  SA (KLOR-CON  M) 20 MEQ tablet TAKE 1 TABLET BY MOUTH DAILY   predniSONE  (DELTASONE ) 10 MG tablet Take 4 pills once daily by mouth for 3 days, then 3 pills daily for 3 days, then 2 pills daily for 3 days then 1 pill daily for 3 days then stop   rosuvastatin  (CRESTOR ) 20 MG tablet TAKE 1 TABLET BY MOUTH DAILY AT  NOON   Facility-Administered Encounter Medications as of 08/11/2024  Medication   denosumab  (PROLIA ) injection 60 mg    History: Past Medical History:  Diagnosis Date   Anemia    Arthritis    hands   Diabetes mellitus    type II   Dizziness    or vertigo   Glaucoma    Hyperlipidemia    Hypertension    Posterior auricular lymphadenopathy    Right - comes and goes   Past Surgical History:  Procedure Laterality Date  bladder papilloma  2006   CATARACT EXTRACTION W/PHACO Left 01/17/2022   Procedure: CATARACT EXTRACTION PHACO AND INTRAOCULAR LENS PLACEMENT (IOC) LEFT DIABETIC RAYNOR LENS;  Surgeon: Jaye Fallow, MD;  Location: [facility];  Service: Ophthalmology;  Laterality: Left;  Diabetic 19.50 02:06.5   CATARACT EXTRACTION W/PHACO Right 01/31/2022   Procedure: CATARACT EXTRACTION PHACO AND INTRAOCULAR LENS PLACEMENT (IOC) RIGHT DIABETIC RAYNOR LENS;  Surgeon: Jaye Fallow, MD;  Location: [facility];  Service: Ophthalmology;   Laterality: Right;  10.55 0:56.4   ROTATOR CUFF REPAIR  01/2002   vaginal hysterectomy     Family History  Problem Relation Age of Onset   Heart disease Mother        MI   Heart attack Mother    Hypertension Mother    Heart disease Father        MI   Heart attack Father    Hypertension Father    Cancer Brother        pancreatic    Social History   Occupational History   Not on file  Tobacco Use   Smoking status: Never   Smokeless tobacco: Never  Vaping Use   Vaping status: Never Used  Substance and Sexual Activity   Alcohol use: No    Alcohol/week: 0.0 standard drinks of alcohol   Drug use: No   Sexual activity: Never   Tobacco Counseling Counseling given: Not Answered  SDOH Screenings   Food Insecurity: No Food Insecurity (08/11/2024)  Housing: Low Risk  (08/11/2024)  Transportation Needs: No Transportation Needs (08/11/2024)  Utilities: Not At Risk (08/11/2024)  Alcohol Screen: Low Risk  (08/09/2023)  Depression (PHQ2-9): Low Risk  (08/11/2024)  Financial Resource Strain: Low Risk  (08/09/2023)  Physical Activity: Insufficiently Active (08/11/2024)  Social Connections: Moderately Isolated (08/11/2024)  Stress: No Stress Concern Present (08/11/2024)  Tobacco Use: Low Risk  (08/11/2024)  Health Literacy: Adequate Health Literacy (08/11/2024)   See flowsheets for full screening details  Depression Screen PHQ 2 & 9 Depression Scale- Over the past 2 weeks, how often have you been bothered by any of the following problems? Little interest or pleasure in doing things: 0 Feeling down, depressed, or hopeless (PHQ Adolescent also includes...irritable): 0 PHQ-2 Total Score: 0 Trouble falling or staying asleep, or sleeping too much: 0 Feeling tired or having little energy: 0 Poor appetite or overeating (PHQ Adolescent also includes...weight loss): 0 Feeling bad about yourself - or that you are a failure or have let yourself or your family down: 0 Trouble concentrating on  things, such as reading the newspaper or watching television (PHQ Adolescent also includes...like school work): 0 Moving or speaking so slowly that other people could have noticed. Or the opposite - being so fidgety or restless that you have been moving around a lot more than usual: 0 Thoughts that you would be better off dead, or of hurting yourself in some way: 0 PHQ-9 Total Score: 0 If you checked off any problems, how difficult have these problems made it for you to do your work, take care of things at home, or get along with other people?: Not difficult at all  Depression Treatment Depression Interventions/Treatment : Medication; Counseling     Goals Addressed             This Visit's Progress    Patient Stated       Improve health             Objective:    Today's Vitals  08/11/24 0828  BP: 134/64  Weight: 115 lb (52.2 kg)  Height: 4' 11.5 (1.511 m)   Body mass index is 22.84 kg/m.  Hearing/Vision screen Hearing Screening - Comments:: Some difficulties hearing  Vision Screening - Comments:: Patient has had eye surgery, Dr SHAUNNA at [facility] eye  Immunizations and Health Maintenance Health Maintenance  Topic Date Due   Bone Density Scan  06/23/2023   COVID-19 Vaccine (5 - 2025-26 season) 05/05/2024   Mammogram  08/12/2024   Zoster Vaccines- Shingrix (2 of 2) 12/06/2024 (Originally 10/01/2023)   FOOT EXAM  09/20/2024   HEMOGLOBIN A1C  11/04/2024   OPHTHALMOLOGY EXAM  08/11/2025   Medicare Annual Wellness (AWV)  08/11/2025   Pneumococcal Vaccine: 50+ Years  Completed   Influenza Vaccine  Completed   Meningococcal B Vaccine  Aged Out   DTaP/Tdap/Td  Discontinued        Assessment/Plan:  This is a routine wellness examination for Kathy Cunningham.  Patient Care Team: Tower, Laine LABOR, MD as PCP - General Jaye Fallow, MD as Referring Physician (Ophthalmology)  I have personally reviewed and noted the following in the patient's chart:   Medical and social  history Use of alcohol, tobacco or illicit drugs  Current medications and supplements including opioid prescriptions. Functional ability and status Nutritional status Physical activity Advanced directives List of other physicians Hospitalizations, surgeries, and ER visits in previous 12 months Vitals Screenings to include cognitive, depression, and falls Referrals and appointments  No orders of the defined types were placed in this encounter.  In addition, I have reviewed and discussed with patient certain preventive protocols, quality metrics, and best practice recommendations. A written personalized care plan for preventive services as well as general preventive health recommendations were provided to patient.   Kathy Cunningham Right, CMA   08/11/2024   Return in 1 year (on 08/11/2025).  After Visit Summary: (MyChart) Due to this being a telephonic visit, the after visit summary with patients personalized plan was offered to patient via MyChart   Nurse Notes: nothing to report. At this time

## 2024-08-13 ENCOUNTER — Ambulatory Visit
Admission: RE | Admit: 2024-08-13 | Discharge: 2024-08-13 | Disposition: A | Discharge status: Home / Self Care | Source: Ambulatory Visit | Attending: Family Medicine | Admitting: Family Medicine

## 2024-08-13 DIAGNOSIS — Z1231 Encounter for screening mammogram for malignant neoplasm of breast: Secondary | ICD-10-CM

## 2024-08-17 ENCOUNTER — Telehealth: Payer: Self-pay | Admitting: Family Medicine

## 2024-08-17 DIAGNOSIS — E119 Type 2 diabetes mellitus without complications: Secondary | ICD-10-CM

## 2024-08-17 DIAGNOSIS — I10 Essential (primary) hypertension: Secondary | ICD-10-CM

## 2024-08-17 DIAGNOSIS — E1169 Type 2 diabetes mellitus with other specified complication: Secondary | ICD-10-CM

## 2024-08-17 DIAGNOSIS — E559 Vitamin D deficiency, unspecified: Secondary | ICD-10-CM

## 2024-08-17 DIAGNOSIS — M810 Age-related osteoporosis without current pathological fracture: Secondary | ICD-10-CM

## 2024-08-17 NOTE — Telephone Encounter (Signed)
-----   Message from Veva JINNY Ferrari sent at 07/30/2024  2:33 PM EST ----- Regarding: Lab orders for Tue, 12.16.25 Patient is scheduled for CPX labs, please order future labs, Thanks , Veva

## 2024-08-19 ENCOUNTER — Other Ambulatory Visit (INDEPENDENT_AMBULATORY_CARE_PROVIDER_SITE_OTHER)

## 2024-08-19 ENCOUNTER — Ambulatory Visit: Payer: Self-pay | Admitting: Family Medicine

## 2024-08-19 DIAGNOSIS — E785 Hyperlipidemia, unspecified: Secondary | ICD-10-CM

## 2024-08-19 DIAGNOSIS — Z7984 Long term (current) use of oral hypoglycemic drugs: Secondary | ICD-10-CM

## 2024-08-19 DIAGNOSIS — E1169 Type 2 diabetes mellitus with other specified complication: Secondary | ICD-10-CM

## 2024-08-19 DIAGNOSIS — I10 Essential (primary) hypertension: Secondary | ICD-10-CM

## 2024-08-19 DIAGNOSIS — E119 Type 2 diabetes mellitus without complications: Secondary | ICD-10-CM

## 2024-08-19 DIAGNOSIS — E559 Vitamin D deficiency, unspecified: Secondary | ICD-10-CM

## 2024-08-19 LAB — COMPREHENSIVE METABOLIC PANEL WITH GFR
ALT: 11 U/L (ref 3–35)
AST: 21 U/L (ref 5–37)
Albumin: 4 g/dL (ref 3.5–5.2)
Alkaline Phosphatase: 52 U/L (ref 39–117)
BUN: 22 mg/dL (ref 6–23)
CO2: 29 meq/L (ref 19–32)
Calcium: 9.4 mg/dL (ref 8.4–10.5)
Chloride: 106 meq/L (ref 96–112)
Creatinine, Ser: 1.19 mg/dL (ref 0.40–1.20)
GFR: 41.32 mL/min — ABNORMAL LOW (ref >60.00)
Glucose, Bld: 128 mg/dL — ABNORMAL HIGH (ref 70–99)
Potassium: 4.4 meq/L (ref 3.5–5.1)
Sodium: 142 meq/L (ref 135–145)
Total Bilirubin: 0.7 mg/dL (ref 0.2–1.2)
Total Protein: 6.6 g/dL (ref 6.0–8.3)

## 2024-08-19 LAB — CBC WITH DIFFERENTIAL/PLATELET
Basophils Absolute: 0 K/uL (ref 0.0–0.1)
Basophils Relative: 0.9 % (ref 0.0–3.0)
Eosinophils Absolute: 0.1 K/uL (ref 0.0–0.7)
Eosinophils Relative: 2.5 % (ref 0.0–5.0)
HCT: 35.1 % — ABNORMAL LOW (ref 36.0–46.0)
Hemoglobin: 11.7 g/dL — ABNORMAL LOW (ref 12.0–15.0)
Lymphocytes Relative: 24.2 % (ref 12.0–46.0)
Lymphs Abs: 1.1 K/uL (ref 0.7–4.0)
MCHC: 33.2 g/dL (ref 30.0–36.0)
MCV: 96.7 fl (ref 78.0–100.0)
Monocytes Absolute: 0.7 K/uL (ref 0.1–1.0)
Monocytes Relative: 14.9 % — ABNORMAL HIGH (ref 3.0–12.0)
Neutro Abs: 2.7 K/uL (ref 1.4–7.7)
Neutrophils Relative %: 57.5 % (ref 43.0–77.0)
Platelets: 248 K/uL (ref 150.0–400.0)
RBC: 3.63 Mil/uL — ABNORMAL LOW (ref 3.87–5.11)
RDW: 14.6 % (ref 11.5–15.5)
WBC: 4.8 K/uL (ref 4.0–10.5)

## 2024-08-19 LAB — TSH: TSH: 0.81 u[IU]/mL (ref 0.35–5.50)

## 2024-08-19 LAB — HEMOGLOBIN A1C: Hgb A1c MFr Bld: 6.3 % (ref 4.6–6.5)

## 2024-08-19 LAB — LIPID PANEL
Cholesterol: 163 mg/dL (ref 28–200)
HDL: 79.2 mg/dL (ref >39.00)
LDL Cholesterol: 74 mg/dL (ref 10–99)
NonHDL: 83.87
Total CHOL/HDL Ratio: 2
Triglycerides: 49 mg/dL (ref 10.0–149.0)
VLDL: 9.8 mg/dL (ref 0.0–40.0)

## 2024-08-19 LAB — MICROALBUMIN / CREATININE URINE RATIO
Creatinine,U: 60.6 mg/dL
Microalb Creat Ratio: 135.1 mg/g — ABNORMAL HIGH (ref 0.0–30.0)
Microalb, Ur: 8.2 mg/dL — ABNORMAL HIGH (ref 0.7–1.9)

## 2024-08-19 LAB — VITAMIN D 25 HYDROXY (VIT D DEFICIENCY, FRACTURES): VITD: 23.76 ng/mL — ABNORMAL LOW (ref 30.00–100.00)

## 2024-08-26 ENCOUNTER — Ambulatory Visit: Admitting: Family Medicine

## 2024-08-26 ENCOUNTER — Encounter: Payer: Self-pay | Admitting: Family Medicine

## 2024-08-26 VITALS — BP 120/65 | HR 84 | Temp 97.8°F | Ht 59.25 in | Wt 116.1 lb

## 2024-08-26 DIAGNOSIS — E119 Type 2 diabetes mellitus without complications: Secondary | ICD-10-CM

## 2024-08-26 DIAGNOSIS — I10 Essential (primary) hypertension: Secondary | ICD-10-CM | POA: Diagnosis not present

## 2024-08-26 DIAGNOSIS — E1169 Type 2 diabetes mellitus with other specified complication: Secondary | ICD-10-CM

## 2024-08-26 DIAGNOSIS — I739 Peripheral vascular disease, unspecified: Secondary | ICD-10-CM | POA: Diagnosis not present

## 2024-08-26 DIAGNOSIS — E559 Vitamin D deficiency, unspecified: Secondary | ICD-10-CM

## 2024-08-26 DIAGNOSIS — E785 Hyperlipidemia, unspecified: Secondary | ICD-10-CM

## 2024-08-26 DIAGNOSIS — Z7984 Long term (current) use of oral hypoglycemic drugs: Secondary | ICD-10-CM | POA: Diagnosis not present

## 2024-08-26 DIAGNOSIS — M810 Age-related osteoporosis without current pathological fracture: Secondary | ICD-10-CM

## 2024-08-26 DIAGNOSIS — M7918 Myalgia, other site: Secondary | ICD-10-CM

## 2024-08-26 DIAGNOSIS — N1832 Chronic kidney disease, stage 3b: Secondary | ICD-10-CM | POA: Diagnosis not present

## 2024-08-26 DIAGNOSIS — Z0000 Encounter for general adult medical examination without abnormal findings: Secondary | ICD-10-CM | POA: Diagnosis not present

## 2024-08-26 DIAGNOSIS — E876 Hypokalemia: Secondary | ICD-10-CM | POA: Diagnosis not present

## 2024-08-26 DIAGNOSIS — R413 Other amnesia: Secondary | ICD-10-CM | POA: Diagnosis not present

## 2024-08-26 DIAGNOSIS — D568 Other thalassemias: Secondary | ICD-10-CM

## 2024-08-26 NOTE — Assessment & Plan Note (Signed)
 Ongoing Some scar from prior buttock sore  Has neuro surg appointment next month   Recommend donut pillow for sitting

## 2024-08-26 NOTE — Assessment & Plan Note (Signed)
 Dexa ordered  Pt will call to schedule   Discussed fall prevention, supplements and exercise for bone density  Encouraged better compliance with vit D  Continues alendronate  since 2022 -watching renal function

## 2024-08-26 NOTE — Patient Instructions (Addendum)
 You are due for second shingrix vaccine from the pharmacy   Get back on track with your vitamin D3 over the counter -at least 2000 international units daily  Put it in your pill box so you don't forget   Schedule follow up next month to discuss memory   Start back on the 81 mg aspirin daily    You have an order for:  []   3D Mammogram  [x]   Bone Density     Please call for appointment:   []   [facility]  [address] [place] [address]  [phone]  []   [facility])   [address]  [place], [place] [address]  [phone]  []   [facility]      [address] [place], [place]        [phone]         []   [facility]  [address] [place], [place]  [phone]  [x]  [facility]   [address]   [place], [place] [address]  [phone]  []  [facility]  [address] [place], [place] [address] [phone]    Make sure to wear two piece clothing  No lotions powders or deodorants the day of the appointment Make sure to bring picture ID and insurance card.  Bring list of medications you are currently taking including any supplements.   Schedule your screening mammogram through MyChart!   Select [place] imaging sites can now be scheduled through MyChart.  Log into your MyChart account.  Go to Visit (or Appointments if  on mobile App) --> Schedule an  Appointment  Under Select a Reason for Visit choose the Mammogram  Screening option.  Complete the pre-visit questions  and select the time and place that  best fits your schedule

## 2024-08-26 NOTE — Assessment & Plan Note (Signed)
 Lab Results  Component Value Date   HGBA1C 6.3 08/19/2024   HGBA1C 6.4 05/07/2024   HGBA1C 5.8 (A) 03/20/2024   Lab Results  Component Value Date   MICROALBUR 8.2 (H) 08/19/2024   MICROALBUR 4.2 (H) 04/11/2024     Continues jardiance  10 mg daily  Also nephrology care  On arb and statin

## 2024-08-26 NOTE — Assessment & Plan Note (Signed)
 Last vitamin D  Lab Results  Component Value Date   VD25OH 23.76 (L) 08/19/2024   Encouraged better compliance with supplement for bone and overall health

## 2024-08-26 NOTE — Assessment & Plan Note (Signed)
 Reviewed health habits including diet and exercise and skin cancer prevention Reviewed appropriate screening tests for age  Also reviewed health mt list, fam hx and immunization status , as well as social and family history   See HPI Labs reviewed and ordered Health Maintenance  Topic Date Due   Osteoporosis screening with Bone Density Scan  06/23/2023   Zoster (Shingles) Vaccine (2 of 2) 12/06/2024*   COVID-19 Vaccine (5 - 2025-26 season) 09/11/2025*   Complete foot exam   09/20/2024   Hemoglobin A1C  02/17/2025   Eye exam for diabetics  08/11/2025   Medicare Annual Wellness Visit  08/11/2025   Breast Cancer Screening  08/13/2025   Pneumococcal Vaccine for age over 41  Completed   Flu Shot  Completed   Meningitis B Vaccine  Aged Out   DTaP/Tdap/Td vaccine  Discontinued  *Topic was postponed. The date shown is not the original due date.    Due for 2nd shingrix at pharmacy Dexa ordered  Discussed fall prevention, supplements and exercise for bone density  PHQ 13- declines re start lexapro  for now , will return for visit for memory/cognition

## 2024-08-26 NOTE — Assessment & Plan Note (Signed)
 Short term  Will follow up to discuss further

## 2024-08-26 NOTE — Assessment & Plan Note (Addendum)
 bp in fair control at this time  BP Readings from Last 1 Encounters:  08/26/24 120/65   No changes needed Most recent labs reviewed  Disc lifstyle change with low sodium diet and exercise  Plan to continue Amlodipine  10 mg daily  Metoprolol  100 mg bid Olmesartan  40 mg daily   Bmet with GFR of 41.3 (seeing nephrology) Encouraged good water intake

## 2024-08-26 NOTE — Assessment & Plan Note (Signed)
 Lab Results  Component Value Date   K 4.4 08/19/2024   Continue klor con 20 meq daily

## 2024-08-26 NOTE — Assessment & Plan Note (Signed)
 Disc goals for lipids and reasons to control them Rev last labs with pt Rev low sat fat diet in detail  LDL of 74 with HDL over 70 Good control Continues rosuvastatin  20 mg daily

## 2024-08-26 NOTE — Assessment & Plan Note (Signed)
 HQM58.6 On jardiance   Under nephrology care Working on fluid intake

## 2024-08-26 NOTE — Assessment & Plan Note (Signed)
 S/p RLE recent vascular procedure  Mild ankle swelling

## 2024-08-26 NOTE — Progress Notes (Signed)
 "  Subjective:    Patient ID: Kathy Cunningham, female    DOB: 08/03/1936, 88 y.o.   MRN: [id number]  HPI  Here for health maintenance exam and to review chronic medical problems   Wt Readings from Last 3 Encounters:  08/26/24 116 lb 2 oz (52.7 kg)  08/11/24 115 lb (52.2 kg)  07/04/24 117 lb (53.1 kg)   23.26 kg/m  Vitals:   08/26/24 1134 08/26/24 1204  BP: (!) 136/50 120/65  Pulse: 84   Temp: 97.8 F (36.6 C)   SpO2: 99%     Immunization History  Administered Date(s) Administered   Fluad Quad(high Dose 65+) 07/07/2019, 07/11/2021, 08/08/2022   Fluad Trivalent(High Dose 65+) 09/21/2023   INFLUENZA, HIGH DOSE SEASONAL PF 07/05/2018, 07/04/2024   Influenza,inj,Quad PF,6+ Mos 06/13/2013, 09/11/2014, 06/15/2015, 09/29/2016, 09/11/2017   PFIZER Comirnaty(Gray Top)Covid-19 Tri-Sucrose Vaccine 03/18/2021   PFIZER(Purple Top)SARS-COV-2 Vaccination 10/27/2019, 11/17/2019   Pfizer Covid-19 Vaccine Bivalent Booster 39yrs & up 09/19/2021   Pneumococcal Conjugate-13 09/11/2014   Pneumococcal Polysaccharide-23 06/13/2013   Td 09/04/1993, 10/04/2009   Zoster Recombinant(Shingrix) 08/06/2023    Health Maintenance Due  Topic Date Due   Bone Density Scan  06/23/2023   Shingrix - had 08/2023   Mammogram 06/2024 Self breast exam-no lumps   Gyn health-no complaints    Colon cancer screening -out aged   Bone health  Dexa 06/2021 Osteoporosis Alendronate  weekly since 2022 -plan 5 y course  Falls-none  Fractures-none  Supplements -vitamin D  2000 international units daily  Last vitamin D  Lab Results  Component Value Date   VD25OH 23.76 (L) 08/19/2024    Exercise  Walking   Stays active Some pain with sitting     Mood    08/26/2024   11:38 AM 08/11/2024    8:34 AM 07/04/2024    8:30 AM 03/20/2024    9:48 AM 09/21/2023   10:26 AM  Depression screen PHQ 2/9  Decreased Interest 3 0 0 2 0  Down, Depressed, Hopeless 0 0 0 1 0  PHQ - 2 Score 3 0 0 3 0  Altered  sleeping 2 0 0 0 0  Tired, decreased energy 2 0 1 3 3   Change in appetite 0 0 0 3 3  Feeling bad or failure about yourself  1 0 0 1 0  Trouble concentrating 2 0 1 1 3   Moving slowly or fidgety/restless 3 0 0 2 2  Suicidal thoughts 0 0 0 1 0  PHQ-9 Score 13 0 2  14  11    Difficult doing work/chores Somewhat difficult Not difficult at all Somewhat difficult Somewhat difficult Somewhat difficult     Data saved with a previous flowsheet row definition      08/26/2024   11:39 AM 07/04/2024    8:30 AM 09/21/2023   10:27 AM 08/21/2023   10:21 AM  GAD 7 : Generalized Anxiety Score  Nervous, Anxious, on Edge 2 2 1 2   Control/stop worrying 2 1 2 2   Worry too much - different things 2 1 2  0  Trouble relaxing 1 1 1  0  Restless 1 0 0 0  Easily annoyed or irritable 3 2 3 3   Afraid - awful might happen 0 0 1 0  Total GAD 7 Score 11 7 10 7   Anxiety Difficulty Somewhat difficult Somewhat difficult Somewhat difficult Somewhat difficult     GAD Tried lexapro  in past  Some push back from that   Some issues with short term memory issues (self and  family notice)      HTN bp is stable today  No cp or palpitations or headaches or edema  No side effects to medicines  BP Readings from Last 3 Encounters:  08/26/24 120/65  08/11/24 134/64  07/04/24 134/64    Amlodipine  10 mg daily  Metoprolol  100 mg bid Olmesartan  40 mg daily    CKD3b Lab Results  Component Value Date   NA 142 08/19/2024   K 4.4 08/19/2024   CO2 29 08/19/2024   GLUCOSE 128 (H) 08/19/2024   BUN 22 08/19/2024   CREATININE 1.19 08/19/2024   CALCIUM  9.4 08/19/2024   GFR 41.32 (L) 08/19/2024   GFRNONAA 50 (L) 02/01/2021   History of pvd On plavix  Had another vascular procedure in right leg   Had visit with kidney doctor  [place] it is ok to get back on 81 mg asa   Is keeping track of fluid intake  Does drink water     Hyperlipidemia Lab Results  Component Value Date   CHOL 163 08/19/2024   CHOL 151  08/21/2023   CHOL 166 12/08/2022   Lab Results  Component Value Date   HDL 79.20 08/19/2024   HDL 70.80 08/21/2023   HDL 72.40 12/08/2022   Lab Results  Component Value Date   LDLCALC 74 08/19/2024   LDLCALC 67 08/21/2023   LDLCALC 78 12/08/2022   Lab Results  Component Value Date   TRIG 49.0 08/19/2024   TRIG 65.0 08/21/2023   TRIG 78.0 12/08/2022   Lab Results  Component Value Date   CHOLHDL 2 08/19/2024   CHOLHDL 2 08/21/2023   CHOLHDL 2 12/08/2022   No results found for: LDLDIRECT Rosuvastatin  20 mg daily    DM2 Lab Results  Component Value Date   HGBA1C 6.3 08/19/2024   HGBA1C 6.4 05/07/2024   HGBA1C 5.8 (A) 03/20/2024   Lab Results  Component Value Date   MICROALBUR 8.2 (H) 08/19/2024   MICROALBUR 4.2 (H) 04/11/2024   Jardiance  10 mg daily  Off metformin   Arb for renal protection    History of thalassemia Lab Results  Component Value Date   WBC 4.8 08/19/2024   HGB 11.7 (L) 08/19/2024   HCT 35.1 (L) 08/19/2024   MCV 96.7 08/19/2024   PLT 248.0 08/19/2024   Lab Results  Component Value Date   TSH 0.81 08/19/2024      Patient Active Problem List   Diagnosis Date Noted   Memory change 08/26/2024   Right buttock pain 03/20/2024   Chronic kidney disease (CKD) stage G3b/A1, moderately decreased glomerular filtration rate (GFR) between 30-44 mL/min/1.73 square meter and albuminuria creatinine ratio less than 30 mg/g (HCC) 09/21/2023   PVD (peripheral vascular disease) 12/08/2022   Ingrown nail of great toe 12/08/2022   Hearing loss 10/22/2019   Tinnitus aurium, right 09/26/2019   Routine general medical examination at a health care facility 06/15/2015   Estrogen deficiency 06/15/2015   Breast cancer screening 12/17/2013   Encounter for screening mammogram for breast cancer 04/24/2011   Glaucoma 04/24/2011   Vitamin D  deficiency 04/24/2011   GAD (generalized anxiety disorder) 10/20/2010   HYPOKALEMIA 10/22/2009   Other thalassemia     Osteoporosis 05/01/2008   Diabetes mellitus treated with oral medication (HCC) 07/17/2007   Hyperlipidemia associated with type 2 diabetes mellitus (HCC) 07/17/2007   Anemia 07/17/2007   Essential hypertension 07/17/2007   PREMATURE VENTRICULAR CONTRACTIONS, FREQUENT 07/17/2007   UTI'S, RECURRENT 07/17/2007   Past Medical History:  Diagnosis Date  Anemia    Arthritis    hands   Diabetes mellitus    type II   Dizziness    or vertigo   Glaucoma    Hyperlipidemia    Hypertension    Posterior auricular lymphadenopathy    Right - comes and goes   Past Surgical History:  Procedure Laterality Date   bladder papilloma  2006   CATARACT EXTRACTION W/PHACO Left 01/17/2022   Procedure: CATARACT EXTRACTION PHACO AND INTRAOCULAR LENS PLACEMENT (IOC) LEFT DIABETIC RAYNOR LENS;  Surgeon: Jaye Fallow, MD;  Location: [facility];  Service: Ophthalmology;  Laterality: Left;  Diabetic 19.50 02:06.5   CATARACT EXTRACTION W/PHACO Right 01/31/2022   Procedure: CATARACT EXTRACTION PHACO AND INTRAOCULAR LENS PLACEMENT (IOC) RIGHT DIABETIC RAYNOR LENS;  Surgeon: Jaye Fallow, MD;  Location: [facility];  Service: Ophthalmology;  Laterality: Right;  10.55 0:56.4   ROTATOR CUFF REPAIR  01/2002   vaginal hysterectomy     Social History[1] Family History  Problem Relation Age of Onset   Heart disease Mother        MI   Heart attack Mother    Hypertension Mother    Heart disease Father        MI   Heart attack Father    Hypertension Father    Cancer Brother        pancreatic    Allergies[2] Medications Ordered Prior to Encounter[3]  Review of Systems  Constitutional:  Positive for fatigue. Negative for activity change, appetite change, fever and unexpected weight change.  HENT:  Negative for congestion, ear pain, rhinorrhea, sinus pressure and sore throat.   Eyes:  Negative for pain, redness and visual disturbance.  Respiratory:  Negative for cough, shortness of  breath and wheezing.   Cardiovascular:  Negative for chest pain and palpitations.  Gastrointestinal:  Negative for abdominal pain, blood in stool, constipation and diarrhea.  Endocrine: Negative for polydipsia and polyuria.  Genitourinary:  Negative for dysuria, frequency and urgency.  Musculoskeletal:  Negative for arthralgias, back pain and myalgias.       Buttock pain to sit   Skin:  Negative for pallor and rash.  Allergic/Immunologic: Negative for environmental allergies.  Neurological:  Negative for dizziness, syncope and headaches.  Hematological:  Negative for adenopathy. Does not bruise/bleed easily.  Psychiatric/Behavioral:  Positive for dysphoric mood. Negative for decreased concentration. The patient is nervous/anxious.        Objective:   Physical Exam Constitutional:      General: She is not in acute distress.    Appearance: Normal appearance. She is well-developed and normal weight. She is not ill-appearing or diaphoretic.  HENT:     Head: Normocephalic and atraumatic.     Right Ear: Tympanic membrane, ear canal and external ear normal.     Left Ear: Tympanic membrane, ear canal and external ear normal.     Nose: Nose normal. No congestion.     Mouth/Throat:     Mouth: Mucous membranes are moist.     Pharynx: Oropharynx is clear. No posterior oropharyngeal erythema.  Eyes:     General: No scleral icterus.    Extraocular Movements: Extraocular movements intact.     Conjunctiva/sclera: Conjunctivae normal.     Pupils: Pupils are equal, round, and reactive to light.  Neck:     Thyroid : No thyromegaly.     Vascular: No carotid bruit or JVD.  Cardiovascular:     Rate and Rhythm: Normal rate and regular rhythm.  Pulses: Normal pulses.     Heart sounds: Normal heart sounds.     No gallop.  Pulmonary:     Effort: Pulmonary effort is normal. No respiratory distress.     Breath sounds: Normal breath sounds. No wheezing.     Comments: Good air exch Chest:     Chest  wall: No tenderness.  Abdominal:     General: Bowel sounds are normal. There is no distension or abdominal bruit.     Palpations: Abdomen is soft. There is no mass.     Tenderness: There is no abdominal tenderness.     Hernia: No hernia is present.  Genitourinary:    Comments: Breast exam: No mass, nodules, thickening, tenderness, bulging, retraction, inflamation, nipple discharge or skin changes noted.  No axillary or clavicular LA.     Musculoskeletal:        General: No tenderness. Normal range of motion.     Cervical back: Normal range of motion and neck supple. No rigidity. No muscular tenderness.     Right lower leg: No edema.     Left lower leg: No edema.     Comments: No kyphosis   Lymphadenopathy:     Cervical: No cervical adenopathy.  Skin:    General: Skin is warm and dry.     Coloration: Skin is not pale.     Findings: No erythema or rash.     Comments: Scar over left sacral area  Mild ischial tenderness  Scattered skin tags   Neurological:     Mental Status: She is alert. Mental status is at baseline.     Cranial Nerves: No cranial nerve deficit.     Motor: No abnormal muscle tone.     Coordination: Coordination normal.     Gait: Gait normal.     Deep Tendon Reflexes: Reflexes are normal and symmetric. Reflexes normal.  Psychiatric:        Mood and Affect: Mood normal.        Cognition and Memory: Cognition and memory normal.           Assessment & Plan:   Problem List Items Addressed This Visit       Cardiovascular and Mediastinum   PVD (peripheral vascular disease)   S/p RLE recent vascular procedure  Mild ankle swelling       Relevant Medications   aspirin EC 81 MG tablet   Essential hypertension   bp in fair control at this time  BP Readings from Last 1 Encounters:  08/26/24 120/65   No changes needed Most recent labs reviewed  Disc lifstyle change with low sodium diet and exercise  Plan to continue Amlodipine  10 mg daily  Metoprolol   100 mg bid Olmesartan  40 mg daily   Bmet with GFR of 41.3 (seeing nephrology) Encouraged good water intake          Relevant Medications   aspirin EC 81 MG tablet     Endocrine   Hyperlipidemia associated with type 2 diabetes mellitus (HCC)   Disc goals for lipids and reasons to control them Rev last labs with pt Rev low sat fat diet in detail  LDL of 74 with HDL over 70 Good control Continues rosuvastatin  20 mg daily      Relevant Medications   aspirin EC 81 MG tablet   Diabetes mellitus treated with oral medication (HCC)   Lab Results  Component Value Date   HGBA1C 6.3 08/19/2024   HGBA1C 6.4 05/07/2024   HGBA1C  5.8 (A) 03/20/2024   Lab Results  Component Value Date   MICROALBUR 8.2 (H) 08/19/2024   MICROALBUR 4.2 (H) 04/11/2024     Continues jardiance  10 mg daily  Also nephrology care  On arb and statin       Relevant Medications   aspirin EC 81 MG tablet     Musculoskeletal and Integument   Osteoporosis   Dexa ordered  Pt will call to schedule   Discussed fall prevention, supplements and exercise for bone density  Encouraged better compliance with vit D  Continues alendronate  since 2022 -watching renal function       Relevant Orders   DG Bone Density     Genitourinary   Chronic kidney disease (CKD) stage G3b/A1, moderately decreased glomerular filtration rate (GFR) between 30-44 mL/min/1.73 square meter and albuminuria creatinine ratio less than 30 mg/g (HCC)   GFR41.3 On jardiance   Under nephrology care Working on fluid intake         Other   Vitamin D  deficiency   Last vitamin D  Lab Results  Component Value Date   VD25OH 23.76 (L) 08/19/2024   Encouraged better compliance with supplement for bone and overall health      Routine general medical examination at a health care facility - Primary   Reviewed health habits including diet and exercise and skin cancer prevention Reviewed appropriate screening tests for age  Also reviewed  health mt list, fam hx and immunization status , as well as social and family history   See HPI Labs reviewed and ordered Health Maintenance  Topic Date Due   Osteoporosis screening with Bone Density Scan  06/23/2023   Zoster (Shingles) Vaccine (2 of 2) 12/06/2024*   COVID-19 Vaccine (5 - 2025-26 season) 09/11/2025*   Complete foot exam   09/20/2024   Hemoglobin A1C  02/17/2025   Eye exam for diabetics  08/11/2025   Medicare Annual Wellness Visit  08/11/2025   Breast Cancer Screening  08/13/2025   Pneumococcal Vaccine for age over 76  Completed   Flu Shot  Completed   Meningitis B Vaccine  Aged Out   DTaP/Tdap/Td vaccine  Discontinued  *Topic was postponed. The date shown is not the original due date.    Due for 2nd shingrix at pharmacy Dexa ordered  Discussed fall prevention, supplements and exercise for bone density  PHQ 13- declines re start lexapro  for now , will return for visit for memory/cognition       Right buttock pain   Ongoing Some scar from prior buttock sore  Has neuro surg appointment next month   Recommend donut pillow for sitting       Other thalassemia   Stable  Hb 11.7       Memory change   Short term  Will follow up to discuss further       HYPOKALEMIA   Lab Results  Component Value Date   K 4.4 08/19/2024   Continue klor con 20 meq daily         [1]  Social History Tobacco Use   Smoking status: Never   Smokeless tobacco: Never  Vaping Use   Vaping status: Never Used  Substance Use Topics   Alcohol use: No    Alcohol/week: 0.0 standard drinks of alcohol   Drug use: No  [2]  Allergies Allergen Reactions   Atorvastatin Other (See Comments)    REACTION: cramps   Risedronate Sodium Nausea And Vomiting and Other (See Comments)   Sulfonamide Derivatives Rash  [  3]  Current Outpatient Medications on File Prior to Visit  Medication Sig Dispense Refill   Accu-Chek Softclix Lancets lancets Use as instructed 100 each 3   alendronate   (FOSAMAX ) 70 MG tablet TAKE 1 TABLET BY MOUTH EVERY 7  DAYS WITH FULL GLASS OF WATER ON AN EMPTY STOMACH 12 tablet 0   amLODipine  (NORVASC ) 10 MG tablet TAKE 1 TABLET BY MOUTH DAILY 100 tablet 0   aspirin EC 81 MG tablet Take 81 mg by mouth daily. Swallow whole.     Blood Glucose Monitoring Suppl DEVI 1 each by Does not apply route daily. May substitute to any manufacturer covered by patient's insurance. 1 each 0   brimonidine  (ALPHAGAN ) 0.2 % ophthalmic solution Place 1 drop into both eyes daily.     Cal Carb-Mag Hydrox-Simeth (ROLAIDS ADVANCED PO) Take 1 tablet by mouth as needed (indigestion).     calcium  carbonate (TUMS - DOSED IN MG ELEMENTAL CALCIUM ) 500 MG chewable tablet Chew 1 tablet by mouth daily as needed for indigestion or heartburn.     cholecalciferol (VITAMIN D ) 1000 UNITS tablet Take 2,000 Units by mouth daily.      clopidogrel  (PLAVIX ) 75 MG tablet Take 1 tablet (75 mg total) by mouth daily. 90 tablet 2   glucose blood (ACCU-CHEK GUIDE) test strip Use as instructed 100 each 3   Glucose Blood (BLOOD GLUCOSE TEST STRIPS) STRP 1 each by In Vitro route daily. May substitute to any manufacturer covered by patient's insurance. 100 strip 3   JARDIANCE  10 MG TABS tablet TAKE 1 TABLET BY MOUTH DAILY BEFORE BREAKFAST. 90 tablet 1   Lancets Misc. MISC 1 each by Does not apply route daily. May substitute to any manufacturer covered by patient's insurance. 100 each 3   latanoprost (XALATAN) 0.005 % ophthalmic solution Place 1 drop into both eyes at bedtime.     metoprolol  tartrate (LOPRESSOR ) 100 MG tablet TAKE 1 TABLET BY MOUTH TWICE  DAILY 200 tablet 0   olmesartan  (BENICAR ) 40 MG tablet TAKE 1 TABLET BY MOUTH DAILY 100 tablet 0   potassium chloride  SA (KLOR-CON  M) 20 MEQ tablet TAKE 1 TABLET BY MOUTH DAILY 100 tablet 2   rosuvastatin  (CRESTOR ) 20 MG tablet TAKE 1 TABLET BY MOUTH DAILY AT  NOON 100 tablet 0   Current Facility-Administered Medications on File Prior to Visit  Medication Dose  Route Frequency Provider Last Rate Last Admin   denosumab  (PROLIA ) injection 60 mg  60 mg Subcutaneous Once Niomie Englert, Laine LABOR, MD       "

## 2024-08-26 NOTE — Assessment & Plan Note (Signed)
 Stable  Hb 11.7

## 2024-09-16 NOTE — Progress Notes (Unsigned)
 "  Referring Physician:  Tower, Laine LABOR, MD [address] [place],  [place] [address]  Primary Physician:  Tower, Laine LABOR, MD  History of Present Illness: 09/17/2024 Kathy Cunningham is here today with a chief complaint of acute on chronic low back pain.  Patient was doing yard work in October and the next day woke up with significant pain in her low back.  It radiates into bilateral buttocks, right worse than left and down into her right leg-specifically the calf.  She feels as though it is hard to sit on her buttock as well as on her right hip.  She feels like she is having some weakness when walking a distance secondary to the pain.   Bowel/Bladder Dysfunction: none  Conservative measures:  Physical therapy: has not participated in Multimodal medical therapy including regular antiinflammatories:  Flexeril , Prednisone , Injections: no epidural steroid injections  Past Surgery: no spine surgeries   The symptoms are causing a significant impact on the patient's life.   Review of Systems:  A 10 point review of systems is negative, except for the pertinent positives and negatives detailed in the HPI.  Past Medical History: Past Medical History:  Diagnosis Date   Anemia    Arthritis    hands   Diabetes mellitus    type II   Dizziness    or vertigo   Glaucoma    Hyperlipidemia    Hypertension    Posterior auricular lymphadenopathy    Right - comes and goes    Past Surgical History: Past Surgical History:  Procedure Laterality Date   bladder papilloma  2006   CATARACT EXTRACTION W/PHACO Left 01/17/2022   Procedure: CATARACT EXTRACTION PHACO AND INTRAOCULAR LENS PLACEMENT (IOC) LEFT DIABETIC RAYNOR LENS;  Surgeon: Jaye Fallow, MD;  Location: [facility];  Service: Ophthalmology;  Laterality: Left;  Diabetic 19.50 02:06.5   CATARACT EXTRACTION W/PHACO Right 01/31/2022   Procedure: CATARACT EXTRACTION PHACO AND INTRAOCULAR LENS PLACEMENT (IOC) RIGHT  DIABETIC RAYNOR LENS;  Surgeon: Jaye Fallow, MD;  Location: [facility];  Service: Ophthalmology;  Laterality: Right;  10.55 0:56.4   ROTATOR CUFF REPAIR  01/2002   vaginal hysterectomy      Allergies: Allergies as of 09/17/2024 - Review Complete 08/26/2024  Allergen Reaction Noted   Atorvastatin Other (See Comments) 07/17/2007   Risedronate sodium Nausea And Vomiting and Other (See Comments) 12/20/2011   Sulfonamide derivatives Rash 07/17/2007    Medications: Outpatient Encounter Medications as of 09/17/2024  Medication Sig   Accu-Chek Softclix Lancets lancets Use as instructed   alendronate  (FOSAMAX ) 70 MG tablet TAKE 1 TABLET BY MOUTH EVERY 7  DAYS WITH FULL GLASS OF WATER ON AN EMPTY STOMACH   amLODipine  (NORVASC ) 10 MG tablet TAKE 1 TABLET BY MOUTH DAILY   aspirin EC 81 MG tablet Take 81 mg by mouth daily. Swallow whole.   Blood Glucose Monitoring Suppl DEVI 1 each by Does not apply route daily. May substitute to any manufacturer covered by patient's insurance.   brimonidine  (ALPHAGAN ) 0.2 % ophthalmic solution Place 1 drop into both eyes daily.   Cal Carb-Mag Hydrox-Simeth (ROLAIDS ADVANCED PO) Take 1 tablet by mouth as needed (indigestion).   calcium  carbonate (TUMS - DOSED IN MG ELEMENTAL CALCIUM ) 500 MG chewable tablet Chew 1 tablet by mouth daily as needed for indigestion or heartburn.   cholecalciferol (VITAMIN D ) 1000 UNITS tablet Take 2,000 Units by mouth daily.    clopidogrel  (PLAVIX ) 75 MG tablet Take 1 tablet (  75 mg total) by mouth daily.   glucose blood (ACCU-CHEK GUIDE) test strip Use as instructed   Glucose Blood (BLOOD GLUCOSE TEST STRIPS) STRP 1 each by In Vitro route daily. May substitute to any manufacturer covered by patient's insurance.   JARDIANCE  10 MG TABS tablet TAKE 1 TABLET BY MOUTH DAILY BEFORE BREAKFAST.   Lancets Misc. MISC 1 each by Does not apply route daily. May substitute to any manufacturer covered by patient's insurance.    latanoprost (XALATAN) 0.005 % ophthalmic solution Place 1 drop into both eyes at bedtime.   metoprolol  tartrate (LOPRESSOR ) 100 MG tablet TAKE 1 TABLET BY MOUTH TWICE  DAILY   olmesartan  (BENICAR ) 40 MG tablet TAKE 1 TABLET BY MOUTH DAILY   potassium chloride  SA (KLOR-CON  M) 20 MEQ tablet TAKE 1 TABLET BY MOUTH DAILY   rosuvastatin  (CRESTOR ) 20 MG tablet TAKE 1 TABLET BY MOUTH DAILY AT  NOON   [DISCONTINUED] hydrochlorothiazide  (HYDRODIURIL ) 25 MG tablet Take 25 mg by mouth daily.   Facility-Administered Encounter Medications as of 09/17/2024  Medication   denosumab  (PROLIA ) injection 60 mg    Social History: Social History[1]  Family Medical History: Family History  Problem Relation Age of Onset   Heart disease Mother        MI   Heart attack Mother    Hypertension Mother    Heart disease Father        MI   Heart attack Father    Hypertension Father    Cancer Brother        pancreatic     Physical Examination: @VITALWITHPAIN @  General: Patient is well developed, well nourished, calm, collected, and in no apparent distress. Attention to examination is appropriate.  Psychiatric: Patient is non-anxious.  Head:  Pupils equal, round, and reactive to light.  ENT:  Oral mucosa appears well hydrated.  Neck:   Supple.  Full range of motion.  Respiratory: Patient is breathing without any difficulty.  Extremities: No edema.  Vascular: Palpable dorsal pedal pulses.  Skin:   On exposed skin, there are no abnormal skin lesions.  NEUROLOGICAL:     Awake, alert, oriented to person, place, and time.  Speech is clear and fluent. Fund of knowledge is appropriate.   Cranial Nerves: Pupils equal round and reactive to light.  Facial tone is symmetric.   ROM of spine: Some tenderness of patient to lumbar paraspinals and right trochantic bursa  Strength:  Side Iliopsoas Quads Hamstring PF DF EHL  R 5 5 4 5  4- 4  L 5 5 4+ 5 5 5    Patient has some slight difficulty with her  gait.   Medical Decision Making  Imaging: CT OF THE LUMBAR SPINE WITHOUT CONTRAST 07/23/2024 09:51:30 AM   TECHNIQUE: CT of the lumbar spine was performed without the administration of intravenous contrast. Multiplanar reformatted images are provided for review. Automated exposure control, iterative reconstruction, and/or weight based adjustment of the mA/kV was utilized to reduce the radiation dose to as low as reasonably achievable.   COMPARISON: None available.   CLINICAL HISTORY: Low back pain, right glute, hip pain radiating down right leg for 3 weeks.   FINDINGS:   BONES AND ALIGNMENT: Normal vertebral body heights. No acute fracture or suspicious bone lesion. Grade 1 anterolisthesis at L4-L5 due to facet hypertrophy. Normal alignment otherwise.   DEGENERATIVE CHANGES: L2-L3: Small disc bulge with ligamentum flavum redundancy, mild spinal canal stenosis. L3-L4: Left asymmetric disc bulge and ligamentum flavum redundancy with mild facet hypertrophy,  mild spinal canal stenosis, mild right and moderate left foraminal stenosis. L4-L5: Small disc bulge with ligamentum flavum redundancy, mild spinal canal stenosis, moderate bilateral foraminal stenosis. L5-S1: Severe facet arthrosis with grade 1 anterolisthesis and disc uncovering, moderate spinal canal stenosis, moderate bilateral foraminal stenosis. Small disc bulge.   SOFT TISSUES: No acute abnormality.   IMPRESSION: 1. Grade 1 anterolisthesis at L4-L5 due to severe facet arthrosis with disc uncovering, resulting in moderate spinal canal stenosis and moderate bilateral foraminal stenosis. 2. Moderate bilateral foraminal stenosis at L3-L4.  I have personally reviewed the images and agree with the above interpretation.  Assessment and Plan: Ms. Buch is a pleasant 89 y.o. female with known grade 1 anterolisthesis of L4-5 with significant facet arthrosis, moderate spinal canal stenosis, degenerative changes  comes today for increased back pain after doing yard work approximately 3 months ago.  It primarily goes from her low back into bilateral buttock, worse on the right compared to the left as well as in her right calf.  On exam she does have some weakness to dorsiflexion likely caused by her lumbar stenosis.  Although I do think lumbar stenosis has a great deal do with her pain, there is also potential that this could be coming from her hip.  She was tender palpation of her right greater trochanter.  Patient would like to have as conservative management as possible.  As a result we will plan on the following:  -X-rays today to include extension and flexion for evaluation - Physical therapy referral placed - Pain clinic referral also placed for injections. - Plan to see back in approximately 8 weeks.   Thank you for involving me in the care of this patient.    Lyle Decamp, PA-C Dept. of Neurosurgery      [1]  Social History Tobacco Use   Smoking status: Never   Smokeless tobacco: Never  Vaping Use   Vaping status: Never Used  Substance Use Topics   Alcohol use: No    Alcohol/week: 0.0 standard drinks of alcohol   Drug use: No   "

## 2024-09-17 ENCOUNTER — Encounter: Payer: Self-pay | Admitting: Physician Assistant

## 2024-09-17 ENCOUNTER — Ambulatory Visit

## 2024-09-17 ENCOUNTER — Ambulatory Visit: Admitting: Physician Assistant

## 2024-09-17 VITALS — BP 138/62 | Ht 59.5 in | Wt 114.0 lb

## 2024-09-17 DIAGNOSIS — M48061 Spinal stenosis, lumbar region without neurogenic claudication: Secondary | ICD-10-CM | POA: Diagnosis not present

## 2024-09-17 DIAGNOSIS — M5441 Lumbago with sciatica, right side: Secondary | ICD-10-CM

## 2024-09-17 DIAGNOSIS — M25551 Pain in right hip: Secondary | ICD-10-CM

## 2024-09-17 DIAGNOSIS — M4316 Spondylolisthesis, lumbar region: Secondary | ICD-10-CM | POA: Diagnosis not present

## 2024-09-26 ENCOUNTER — Ambulatory Visit: Admitting: Family Medicine

## 2024-09-26 ENCOUNTER — Encounter: Payer: Self-pay | Admitting: Family Medicine

## 2024-09-26 VITALS — BP 128/60 | HR 68 | Temp 97.6°F | Ht 59.5 in | Wt 117.4 lb

## 2024-09-26 DIAGNOSIS — E1169 Type 2 diabetes mellitus with other specified complication: Secondary | ICD-10-CM | POA: Diagnosis not present

## 2024-09-26 DIAGNOSIS — N1832 Chronic kidney disease, stage 3b: Secondary | ICD-10-CM | POA: Diagnosis not present

## 2024-09-26 DIAGNOSIS — E785 Hyperlipidemia, unspecified: Secondary | ICD-10-CM | POA: Diagnosis not present

## 2024-09-26 DIAGNOSIS — F411 Generalized anxiety disorder: Secondary | ICD-10-CM

## 2024-09-26 DIAGNOSIS — R413 Other amnesia: Secondary | ICD-10-CM

## 2024-09-26 LAB — SEDIMENTATION RATE: Sed Rate: 11 mm/h (ref 0–30)

## 2024-09-26 LAB — VITAMIN B12: Vitamin B-12: 219 pg/mL (ref 211–911)

## 2024-09-26 MED ORDER — ESCITALOPRAM OXALATE 10 MG PO TABS: 10.0000 mg | ORAL_TABLET | Freq: Every day | ORAL | 0 refills | Status: AC

## 2024-09-26 NOTE — Progress Notes (Signed)
 "  Subjective:    Patient ID: Kathy Cunningham, female    DOB: 06/25/1936, 89 y.o.   MRN: [id number]  HPI  Wt Readings from Last 3 Encounters:  09/26/24 117 lb 6 oz (53.2 kg)  09/17/24 114 lb (51.7 kg)  08/26/24 116 lb 2 oz (52.7 kg)   23.31 kg/m  Vitals:   09/26/24 0828 09/26/24 0853  BP: (!) 142/68 128/60  Pulse: 68   Temp: 97.6 F (36.4 C)   SpO2: 98%      Pt presents to discuss  Problem with short term memory   Worse in past 3 months per family   Forgets things she has just done  No problems with remebering -long time ago   Forgets how to turn on stove  Also had trouble remembering how to use the phone (dial a number) Depends on the day  Very worried about losing her credit card  Gets places and cannot figure out how she got there   Has not driven in 3 months and no interest   Some obsessive tendency  Very worried about storm   Tangential  Has trouble following a story  Mood is labile  Ups and downs  Flies off the handle easily /not like her  More anxious   Family notices confusion      Pt does have hearing loss Also GAD Was prescription lexapro  but pt declined to start it        09/26/2024    8:33 AM 08/26/2024   11:38 AM 08/11/2024    8:34 AM 07/04/2024    8:30 AM 03/20/2024    9:48 AM  Depression screen PHQ 2/9  Decreased Interest 3 3 0 0 2  Down, Depressed, Hopeless 1 0 0 0 1  PHQ - 2 Score 4 3 0 0 3  Altered sleeping 0 2 0 0 0  Tired, decreased energy 0 2 0 1 3  Change in appetite 0 0 0 0 3  Feeling bad or failure about yourself  0 1 0 0 1  Trouble concentrating 3 2 0 1 1  Moving slowly or fidgety/restless 3 3 0 0 2  Suicidal thoughts 0 0 0 0 1  PHQ-9 Score 10 13 0 2  14   Difficult doing work/chores Somewhat difficult Somewhat difficult Not difficult at all Somewhat difficult Somewhat difficult     Data saved with a previous flowsheet row definition      09/26/2024    8:33 AM 08/26/2024   11:39 AM 07/04/2024    8:30 AM  09/21/2023   10:27 AM  GAD 7 : Generalized Anxiety Score  Nervous, Anxious, on Edge 2 2  2  1    Control/stop worrying 2 2  1  2    Worry too much - different things 2 2  1  2    Trouble relaxing 2 1  1  1    Restless 0 1  0  0   Easily annoyed or irritable 1 3  2  3    Afraid - awful might happen 2 0  0  1   Total GAD 7 Score 11 11 7 10   Anxiety Difficulty Somewhat difficult Somewhat difficult Somewhat difficult Somewhat difficult     Data saved with a previous flowsheet row definition     Lab Results  Component Value Date   NA 142 08/19/2024   K 4.4 08/19/2024   CO2 29 08/19/2024   GLUCOSE 128 (H) 08/19/2024   BUN 22 08/19/2024  CREATININE 1.19 08/19/2024   CALCIUM  9.4 08/19/2024   GFR 41.32 (L) 08/19/2024   GFRNONAA 50 (L) 02/01/2021   Lab Results  Component Value Date   ALT 11 08/19/2024   AST 21 08/19/2024   ALKPHOS 52 08/19/2024   BILITOT 0.7 08/19/2024   Lab Results  Component Value Date   WBC 4.8 08/19/2024   HGB 11.7 (L) 08/19/2024   HCT 35.1 (L) 08/19/2024   MCV 96.7 08/19/2024   PLT 248.0 08/19/2024   Lab Results  Component Value Date   TSH 0.81 08/19/2024   Lab Results  Component Value Date   VITAMINB12 219 09/26/2024   Last vitamin D  Lab Results  Component Value Date   VD25OH 23.76 (L) 08/19/2024   Lab Results  Component Value Date   ESRSEDRATE 11 09/26/2024   Lab Results  Component Value Date   CHOL 163 08/19/2024   HDL 79.20 08/19/2024   LDLCALC 74 08/19/2024   TRIG 49.0 08/19/2024   CHOLHDL 2 08/19/2024      Patient Active Problem List   Diagnosis Date Noted   Memory change 08/26/2024   Right buttock pain 03/20/2024   Chronic kidney disease (CKD) stage G3b/A1, moderately decreased glomerular filtration rate (GFR) between 30-44 mL/min/1.73 square meter and albuminuria creatinine ratio less than 30 mg/g (HCC) 09/21/2023   PVD (peripheral vascular disease) 12/08/2022   Ingrown nail of great toe 12/08/2022   Hearing loss 10/22/2019    Tinnitus aurium, right 09/26/2019   Routine general medical examination at a health care facility 06/15/2015   Estrogen deficiency 06/15/2015   Breast cancer screening 12/17/2013   Encounter for screening mammogram for breast cancer 04/24/2011   Glaucoma 04/24/2011   Vitamin D  deficiency 04/24/2011   GAD (generalized anxiety disorder) 10/20/2010   HYPOKALEMIA 10/22/2009   Other thalassemia    Osteoporosis 05/01/2008   Diabetes mellitus treated with oral medication (HCC) 07/17/2007   Hyperlipidemia associated with type 2 diabetes mellitus (HCC) 07/17/2007   Anemia 07/17/2007   Essential hypertension 07/17/2007   PREMATURE VENTRICULAR CONTRACTIONS, FREQUENT 07/17/2007   UTI'S, RECURRENT 07/17/2007   Past Medical History:  Diagnosis Date   Anemia    Arthritis    hands   Diabetes mellitus    type II   Dizziness    or vertigo   Glaucoma    Hyperlipidemia    Hypertension    Posterior auricular lymphadenopathy    Right - comes and goes   Past Surgical History:  Procedure Laterality Date   bladder papilloma  2006   CATARACT EXTRACTION W/PHACO Left 01/17/2022   Procedure: CATARACT EXTRACTION PHACO AND INTRAOCULAR LENS PLACEMENT (IOC) LEFT DIABETIC RAYNOR LENS;  Surgeon: Jaye Fallow, MD;  Location: [facility];  Service: Ophthalmology;  Laterality: Left;  Diabetic 19.50 02:06.5   CATARACT EXTRACTION W/PHACO Right 01/31/2022   Procedure: CATARACT EXTRACTION PHACO AND INTRAOCULAR LENS PLACEMENT (IOC) RIGHT DIABETIC RAYNOR LENS;  Surgeon: Jaye Fallow, MD;  Location: [facility];  Service: Ophthalmology;  Laterality: Right;  10.55 0:56.4   ROTATOR CUFF REPAIR  01/2002   vaginal hysterectomy     Social History[1] Family History  Problem Relation Age of Onset   Heart disease Mother        MI   Heart attack Mother    Hypertension Mother    Heart disease Father        MI   Heart attack Father    Hypertension Father    Cancer Brother  pancreatic    Allergies[2] Medications Ordered Prior to Encounter[3]  Review of Systems  Constitutional:  Negative for activity change, appetite change, fatigue, fever and unexpected weight change.  HENT:  Negative for congestion, ear pain, rhinorrhea, sinus pressure and sore throat.   Eyes:  Negative for pain, redness and visual disturbance.  Respiratory:  Negative for cough, shortness of breath and wheezing.   Cardiovascular:  Negative for chest pain and palpitations.  Gastrointestinal:  Negative for abdominal pain, blood in stool, constipation and diarrhea.  Endocrine: Negative for polydipsia and polyuria.  Genitourinary:  Negative for dysuria, frequency and urgency.  Musculoskeletal:  Negative for arthralgias, back pain and myalgias.  Skin:  Negative for pallor and rash.  Allergic/Immunologic: Negative for environmental allergies.  Neurological:  Negative for dizziness, syncope and headaches.  Hematological:  Negative for adenopathy. Does not bruise/bleed easily.  Psychiatric/Behavioral:  Positive for confusion and decreased concentration. Negative for agitation, behavioral problems, dysphoric mood, sleep disturbance and suicidal ideas. The patient is nervous/anxious.        Objective:   Physical Exam Constitutional:      General: She is not in acute distress.    Appearance: Normal appearance. She is normal weight. She is not ill-appearing or diaphoretic.  HENT:     Head: Normocephalic.     Ears:     Comments: Poor hearing Cardiovascular:     Rate and Rhythm: Normal rate and regular rhythm.  Pulmonary:     Effort: Pulmonary effort is normal. No respiratory distress.  Skin:    Coloration: Skin is not pale.     Findings: No rash.  Neurological:     Mental Status: She is alert.     Cranial Nerves: No cranial nerve deficit.     Motor: No weakness.     Coordination: Coordination normal.     Gait: Gait normal.  Psychiatric:        Attention and Perception: Attention  normal.        Mood and Affect: Mood is anxious.        Speech: Speech is tangential.     Comments: MMSE 26.5/30   Able to do the clock draw   Mildly anxious              Assessment & Plan:   Problem List Items Addressed This Visit       Endocrine   Hyperlipidemia associated with type 2 diabetes mellitus (HCC)   Disc goals for lipids and reasons to control them Rev last labs with pt Rev low sat fat diet in detail  LDL of 74 with HDL over 70 Good control Continues rosuvastatin  20 mg daily        Genitourinary   Chronic kidney disease (CKD) stage G3b/A1, moderately decreased glomerular filtration rate (GFR) between 30-44 mL/min/1.73 square meter and albuminuria creatinine ratio less than 30 mg/g (HCC)   GFR41.3 On jardiance   Under nephrology care Working on fluid intake         Other   Memory change - Primary   More problems with short term memory today Also tangential speech  Some obsessive tendencies  Afraid to drive Also labile mood with anxiety and some irritability  In setting of hearing loss Encouraged to get hearing aides as planned  MMSE today 26.5 with good clock draw   Suspect mood/anxiety may contribute to cognitive change Encouraged to try lexapro  (had declined in past) Most recent labs reviewed  Lab for B12 and ESR  Pending result, plan  CT head  Consider neuro eval      Relevant Orders   Vitamin B12 (Completed)   Sedimentation Rate (Completed)   GAD (generalized anxiety disorder)   No doubt adding to memory issues  Declined medication in past/now open to lexapro  10  Discussed expectations of SSRI medication including time to effectiveness and mechanism of action, also poss of side effects (early and late)- including mental fuzziness, weight or appetite change, nausea and poss of worse dep or anxiety (even suicidal thoughts)  Pt voiced understanding and will stop med and update if this occurs  Will reach out if side effects Then plan  f/u      Relevant Medications   escitalopram  (LEXAPRO ) 10 MG tablet      [1]  Social History Tobacco Use   Smoking status: Never   Smokeless tobacco: Never  Vaping Use   Vaping status: Never Used  Substance Use Topics   Alcohol use: No    Alcohol/week: 0.0 standard drinks of alcohol   Drug use: No  [2]  Allergies Allergen Reactions   Atorvastatin Other (See Comments)    REACTION: cramps   Risedronate Sodium Nausea And Vomiting and Other (See Comments)   Sulfonamide Derivatives Rash  [3]  Current Outpatient Medications on File Prior to Visit  Medication Sig Dispense Refill   Accu-Chek Softclix Lancets lancets Use as instructed 100 each 3   alendronate  (FOSAMAX ) 70 MG tablet TAKE 1 TABLET BY MOUTH EVERY 7  DAYS WITH FULL GLASS OF WATER ON AN EMPTY STOMACH 12 tablet 0   amLODipine  (NORVASC ) 10 MG tablet TAKE 1 TABLET BY MOUTH DAILY 100 tablet 0   aspirin EC 81 MG tablet Take 81 mg by mouth daily. Swallow whole.     Blood Glucose Monitoring Suppl DEVI 1 each by Does not apply route daily. May substitute to any manufacturer covered by patient's insurance. 1 each 0   brimonidine  (ALPHAGAN ) 0.2 % ophthalmic solution Place 1 drop into both eyes daily.     Cal Carb-Mag Hydrox-Simeth (ROLAIDS ADVANCED PO) Take 1 tablet by mouth as needed (indigestion).     calcium  carbonate (TUMS - DOSED IN MG ELEMENTAL CALCIUM ) 500 MG chewable tablet Chew 1 tablet by mouth daily as needed for indigestion or heartburn.     cholecalciferol (VITAMIN D ) 1000 UNITS tablet Take 2,000 Units by mouth daily.      clopidogrel  (PLAVIX ) 75 MG tablet Take 1 tablet (75 mg total) by mouth daily. 90 tablet 2   glucose blood (ACCU-CHEK GUIDE) test strip Use as instructed 100 each 3   Glucose Blood (BLOOD GLUCOSE TEST STRIPS) STRP 1 each by In Vitro route daily. May substitute to any manufacturer covered by patient's insurance. 100 strip 3   JARDIANCE  10 MG TABS tablet TAKE 1 TABLET BY MOUTH DAILY BEFORE BREAKFAST. 90  tablet 1   Lancets Misc. MISC 1 each by Does not apply route daily. May substitute to any manufacturer covered by patient's insurance. 100 each 3   latanoprost (XALATAN) 0.005 % ophthalmic solution Place 1 drop into both eyes at bedtime.     metoprolol  tartrate (LOPRESSOR ) 100 MG tablet TAKE 1 TABLET BY MOUTH TWICE  DAILY 200 tablet 0   olmesartan  (BENICAR ) 40 MG tablet TAKE 1 TABLET BY MOUTH DAILY 100 tablet 0   potassium chloride  SA (KLOR-CON  M) 20 MEQ tablet TAKE 1 TABLET BY MOUTH DAILY 100 tablet 2   rosuvastatin  (CRESTOR ) 20 MG tablet TAKE 1 TABLET BY MOUTH DAILY AT  [facility]  100 tablet 0   Current Facility-Administered Medications on File Prior to Visit  Medication Dose Route Frequency Provider Last Rate Last Admin   denosumab  (PROLIA ) injection 60 mg  60 mg Subcutaneous Once Lazlo Tunney, Laine LABOR, MD       "

## 2024-09-26 NOTE — Patient Instructions (Signed)
 Try the generic lexapro  again for anxiety  Take it in the evening (with or without food)  If any side effects or if you worse - stop it and give us  a call   I would like to re visit the hearing aide situation  I may reach out to our community care group    Lab today for B12   We may get a CT of your brain   Socialize as much as you can  Physical activity helps also   Eat a balanced diet  Stay hydrated

## 2024-09-28 ENCOUNTER — Ambulatory Visit: Payer: Self-pay | Admitting: Family Medicine

## 2024-09-28 DIAGNOSIS — R413 Other amnesia: Secondary | ICD-10-CM

## 2024-09-28 MED ORDER — VITAMIN B-12 1000 MCG PO TABS: 1000.0000 ug | ORAL_TABLET | Freq: Every day | ORAL | Status: AC

## 2024-09-28 NOTE — Assessment & Plan Note (Signed)
 Disc goals for lipids and reasons to control them Rev last labs with pt Rev low sat fat diet in detail  LDL of 74 with HDL over 70 Good control Continues rosuvastatin  20 mg daily

## 2024-09-28 NOTE — Assessment & Plan Note (Signed)
 HQM58.6 On jardiance   Under nephrology care Working on fluid intake

## 2024-09-28 NOTE — Assessment & Plan Note (Addendum)
 More problems with short term memory today Also tangential speech  Some obsessive tendencies  Afraid to drive Also labile mood with anxiety and some irritability  In setting of hearing loss Encouraged to get hearing aides as planned  MMSE today 26.5 with good clock draw   Suspect mood/anxiety may contribute to cognitive change Encouraged to try lexapro  (had declined in past) Most recent labs reviewed  Lab for B12 and ESR  Pending result, plan CT head  Consider neuro eval

## 2024-09-28 NOTE — Assessment & Plan Note (Addendum)
 No doubt adding to memory issues  Declined medication in past/now open to lexapro  10  Discussed expectations of SSRI medication including time to effectiveness and mechanism of action, also poss of side effects (early and late)- including mental fuzziness, weight or appetite change, nausea and poss of worse dep or anxiety (even suicidal thoughts)  Pt voiced understanding and will stop med and update if this occurs  Will reach out if side effects Then plan f/u

## 2024-10-06 ENCOUNTER — Other Ambulatory Visit: Payer: Self-pay | Admitting: Family Medicine

## 2024-10-16 ENCOUNTER — Other Ambulatory Visit

## 2024-10-23 ENCOUNTER — Ambulatory Visit: Admitting: Student in an Organized Health Care Education/Training Program

## 2024-11-12 ENCOUNTER — Ambulatory Visit: Admitting: Physician Assistant
